# Patient Record
Sex: Male | Born: 1950
Health system: Southern US, Community
[De-identification: ages and names within clinical notes are randomized; demographics above are authoritative.]

## PROBLEM LIST (undated history)

## (undated) DIAGNOSIS — C801 Malignant (primary) neoplasm, unspecified: Secondary | ICD-10-CM

## (undated) DIAGNOSIS — IMO0001 Reserved for inherently not codable concepts without codable children: Secondary | ICD-10-CM

## (undated) DIAGNOSIS — K9041 Non-celiac gluten sensitivity: Secondary | ICD-10-CM

## (undated) DIAGNOSIS — D509 Iron deficiency anemia, unspecified: Secondary | ICD-10-CM

## (undated) DIAGNOSIS — H8110 Benign paroxysmal vertigo, unspecified ear: Secondary | ICD-10-CM

## (undated) DIAGNOSIS — I10 Essential (primary) hypertension: Secondary | ICD-10-CM

## (undated) DIAGNOSIS — G4730 Sleep apnea, unspecified: Secondary | ICD-10-CM

## (undated) DIAGNOSIS — E039 Hypothyroidism, unspecified: Secondary | ICD-10-CM

## (undated) DIAGNOSIS — K2970 Gastritis, unspecified, without bleeding: Secondary | ICD-10-CM

## (undated) DIAGNOSIS — F3289 Other specified depressive episodes: Secondary | ICD-10-CM

## (undated) DIAGNOSIS — R002 Palpitations: Secondary | ICD-10-CM

## (undated) DIAGNOSIS — J45909 Unspecified asthma, uncomplicated: Secondary | ICD-10-CM

## (undated) DIAGNOSIS — R0602 Shortness of breath: Secondary | ICD-10-CM

## (undated) DIAGNOSIS — F329 Major depressive disorder, single episode, unspecified: Secondary | ICD-10-CM

## (undated) DIAGNOSIS — I2510 Atherosclerotic heart disease of native coronary artery without angina pectoris: Secondary | ICD-10-CM

## (undated) DIAGNOSIS — E7800 Pure hypercholesterolemia, unspecified: Secondary | ICD-10-CM

## (undated) DIAGNOSIS — Z86010 Personal history of colonic polyps: Secondary | ICD-10-CM

## (undated) HISTORY — DX: Other specified depressive episodes: F32.89

## (undated) HISTORY — DX: Pure hypercholesterolemia, unspecified: E78.00

## (undated) HISTORY — DX: Hypothyroidism, unspecified: E03.9

## (undated) HISTORY — DX: Non-celiac gluten sensitivity: K90.41

## (undated) HISTORY — DX: Essential (primary) hypertension: I10

## (undated) HISTORY — DX: Atherosclerotic heart disease of native coronary artery without angina pectoris: I25.10

## (undated) HISTORY — DX: Personal history of colonic polyps: Z86.010

## (undated) HISTORY — PX: MENISCUS REPAIR: SHX5179

## (undated) HISTORY — DX: Malignant (primary) neoplasm, unspecified: C80.1

## (undated) HISTORY — DX: Benign paroxysmal vertigo, unspecified ear: H81.10

## (undated) HISTORY — PX: SHOULDER ARTHROSCOPY W/ ROTATOR CUFF REPAIR: SHX2400

## (undated) HISTORY — PX: POLYPECTOMY: SHX149

## (undated) HISTORY — PX: ROTATOR CUFF REPAIR: SHX139

## (undated) HISTORY — PX: OSTEOTOMY: SHX137

## (undated) HISTORY — DX: Iron deficiency anemia, unspecified: D50.9

## (undated) HISTORY — DX: Shortness of breath: R06.02

## (undated) HISTORY — DX: Unspecified asthma, uncomplicated: J45.909

## (undated) HISTORY — DX: Sleep apnea, unspecified: G47.30

## (undated) HISTORY — DX: Reserved for inherently not codable concepts without codable children: IMO0001

## (undated) HISTORY — DX: Major depressive disorder, single episode, unspecified: F32.9

## (undated) HISTORY — PX: COLONOSCOPY: SHX174

## (undated) HISTORY — DX: Gastritis, unspecified, without bleeding: K29.70

## (undated) HISTORY — DX: Palpitations: R00.2

---

## 2002-09-22 DIAGNOSIS — I2510 Atherosclerotic heart disease of native coronary artery without angina pectoris: Secondary | ICD-10-CM

## 2002-09-22 HISTORY — DX: Atherosclerotic heart disease of native coronary artery without angina pectoris: I25.10

## 2003-08-21 ENCOUNTER — Ambulatory Visit (HOSPITAL_COMMUNITY): Admission: RE | Admit: 2003-08-21 | Discharge: 2003-08-21 | Payer: Self-pay | Admitting: Internal Medicine

## 2003-08-25 ENCOUNTER — Ambulatory Visit (HOSPITAL_COMMUNITY): Admission: RE | Admit: 2003-08-25 | Discharge: 2003-08-26 | Payer: Self-pay | Admitting: Cardiology

## 2003-08-25 HISTORY — PX: CARDIAC CATHETERIZATION: SHX172

## 2006-04-27 ENCOUNTER — Emergency Department (HOSPITAL_COMMUNITY): Admission: EM | Admit: 2006-04-27 | Discharge: 2006-04-27 | Payer: Self-pay | Admitting: Emergency Medicine

## 2006-04-28 ENCOUNTER — Ambulatory Visit (HOSPITAL_COMMUNITY): Admission: RE | Admit: 2006-04-28 | Discharge: 2006-04-28 | Payer: Self-pay | Admitting: Internal Medicine

## 2006-10-23 DIAGNOSIS — Z860100 Personal history of colon polyps, unspecified: Secondary | ICD-10-CM

## 2006-10-23 HISTORY — DX: Personal history of colon polyps, unspecified: Z86.0100

## 2006-10-23 HISTORY — DX: Personal history of colonic polyps: Z86.010

## 2006-11-09 ENCOUNTER — Ambulatory Visit: Payer: Self-pay | Admitting: Gastroenterology

## 2006-11-10 ENCOUNTER — Ambulatory Visit: Payer: Self-pay | Admitting: Gastroenterology

## 2006-11-20 ENCOUNTER — Ambulatory Visit: Payer: Self-pay | Admitting: Gastroenterology

## 2006-11-20 ENCOUNTER — Encounter (INDEPENDENT_AMBULATORY_CARE_PROVIDER_SITE_OTHER): Payer: Self-pay | Admitting: *Deleted

## 2009-11-20 DIAGNOSIS — H8110 Benign paroxysmal vertigo, unspecified ear: Secondary | ICD-10-CM | POA: Insufficient documentation

## 2010-07-01 ENCOUNTER — Telehealth (INDEPENDENT_AMBULATORY_CARE_PROVIDER_SITE_OTHER): Payer: Self-pay | Admitting: Radiology

## 2010-07-02 ENCOUNTER — Ambulatory Visit: Payer: Self-pay

## 2010-07-02 ENCOUNTER — Encounter: Payer: Self-pay | Admitting: Internal Medicine

## 2010-07-02 ENCOUNTER — Encounter (HOSPITAL_COMMUNITY)
Admission: RE | Admit: 2010-07-02 | Discharge: 2010-07-19 | Payer: Self-pay | Source: Home / Self Care | Admitting: Cardiology

## 2010-07-02 ENCOUNTER — Ambulatory Visit: Payer: Self-pay | Admitting: Internal Medicine

## 2010-07-12 ENCOUNTER — Encounter: Admission: RE | Admit: 2010-07-12 | Discharge: 2010-07-12 | Payer: Self-pay | Admitting: Cardiology

## 2010-07-12 ENCOUNTER — Ambulatory Visit: Payer: Self-pay | Admitting: Cardiology

## 2010-07-16 ENCOUNTER — Ambulatory Visit (HOSPITAL_COMMUNITY)
Admission: RE | Admit: 2010-07-16 | Discharge: 2010-07-16 | Payer: Self-pay | Source: Home / Self Care | Admitting: Cardiology

## 2010-07-16 ENCOUNTER — Ambulatory Visit: Payer: Self-pay | Admitting: Cardiology

## 2010-07-17 HISTORY — PX: CARDIAC CATHETERIZATION: SHX172

## 2010-08-26 ENCOUNTER — Ambulatory Visit: Payer: Self-pay | Admitting: Cardiology

## 2010-10-22 NOTE — Progress Notes (Signed)
Summary: NUC PRE-PROCEDURE  Phone Note Outgoing Call   Call placed by: Domenic Polite, CNMT,  July 01, 2010 3:15 PM Call placed to: Patient Reason for Call: Confirm/change Appt Summary of Call: Reviewed information on [employer] Information Sheet (see scanned document for further details).  Spoke with patient.  Initial call taken by: Domenic Polite, CNMT,  July 01, 2010 3:16 PM     Nuclear Med Background Indications for Stress Test: Evaluation for Ischemia, Stent Patency   History: Heart Catheterization  History Comments: '04 CATH/STENT LAD,RCA  Symptoms: Chest Pain, DOE    Nuclear Pre-Procedure Cardiac Risk Factors: Family History - CAD, History of Smoking, Hypertension, Lipids

## 2010-10-22 NOTE — Assessment & Plan Note (Signed)
Summary: Cardiology Nuclear Testing  Nuclear Med Background Indications for Stress Test: Evaluation for Ischemia, Stent Patency   History: Heart Catheterization, Myocardial Perfusion Study, Stents  History Comments: '04 CATH/STENT LAD,RCA. '07 MPS: NL, EF=64%.  Symptoms: Chest Pain, Diaphoresis, DOE, Fatigue, Fatigue with Exertion, Rapid HR, SOB  Symptoms Comments: Last CP 1 mo. ago.   Nuclear Pre-Procedure Cardiac Risk Factors: Family History - CAD, History of Smoking, Hypertension, Lipids Caffeine/Decaff Intake: none NPO After: 8:00 PM Lungs: Clear IV 0.9% NS with Angio Cath: 22g     IV Site: R Hand IV Started by: Doyne Keel, CNMT Chest Size (in) 46     Height (in): 70 Weight (lb): 230 BMI: 33.12 Tech Comments: no meds this am  Nuclear Med Study 1 or 2 day study:  1 day     Stress Test Type:  Stress Reading MD:  Arvilla Meres, MD     Referring MD:  Peter Swaziland Resting Radionuclide:  Technetium 63m Tetrofosmin     Resting Radionuclide Dose:  11 mCi  Stress Radionuclide:  Technetium 39m Tetrofosmin     Stress Radionuclide Dose:  33 mCi   Stress Protocol Exercise Time (min):  8:00 min     Max HR:  148 bpm     Predicted Max HR:  163 bpm  Max Systolic BP: 208 mm Hg     Percent Max HR:  90.80 %     METS: 10.1 Rate Pressure Product:  16109    Stress Test Technologist:  Irean Hong,  RN     Nuclear Technologist:  Doyne Keel, CNMT  Rest Procedure  Myocardial perfusion imaging was performed at rest 45 minutes following the intravenous administration of Technetium 82m Tetrofosmin.  Stress Procedure  The patient exercised for eight minutes, RPE=15.   The patient stopped due to DOE   and complained of chest tightness 7/10 at peak exercise which subsided at in recovery.  There were marked significant ST-T wave changes, rare PVC. The patient had a hypertensive response to exercise. Technetium 17m Tetrofosmin was injected at peak exercise and myocardial perfusion  imaging was performed after a brief delay.  QPS Raw Data Images:  Normal; no motion artifact; normal heart/lung ratio. Stress Images:  Normal homogeneous uptake in all areas of the myocardium. Rest Images:  Normal homogeneous uptake in all areas of the myocardium. Subtraction (SDS):  Normal Transient Ischemic Dilatation:  1.12  (Normal <1.22)  Lung/Heart Ratio:  .31  (Normal <0.45)  Quantitative Gated Spect Images QGS EDV:  104 ml QGS ESV:  39 ml QGS EF:  63 % QGS cine images:  Normal  Findings Normal nuclear study      Overall Impression  Exercise Capacity: Fair exercise capacity. Clinical Symptoms: Chest pain; dyspnea ECG Impression: Positive. 1-2 mm ST depression inferior and lateral leads  Overall Impression: Equivocal stress nuclear study. Overall Impression Comments: Pateint with CP and significantly positive ECG during exercise. Perfusion images show normal perfusion with no significant transient ichemic dilation of LV. Cannot fully exclude balanced ischemia. Correlate clinically.

## 2011-01-27 ENCOUNTER — Encounter: Payer: Self-pay | Admitting: Internal Medicine

## 2011-01-27 DIAGNOSIS — F329 Major depressive disorder, single episode, unspecified: Secondary | ICD-10-CM | POA: Insufficient documentation

## 2011-01-27 DIAGNOSIS — M791 Myalgia, unspecified site: Secondary | ICD-10-CM | POA: Insufficient documentation

## 2011-01-28 ENCOUNTER — Encounter: Payer: Self-pay | Admitting: *Deleted

## 2011-01-28 DIAGNOSIS — E039 Hypothyroidism, unspecified: Secondary | ICD-10-CM | POA: Insufficient documentation

## 2011-01-28 DIAGNOSIS — E7800 Pure hypercholesterolemia, unspecified: Secondary | ICD-10-CM

## 2011-01-28 DIAGNOSIS — I2510 Atherosclerotic heart disease of native coronary artery without angina pectoris: Secondary | ICD-10-CM

## 2011-01-28 DIAGNOSIS — I10 Essential (primary) hypertension: Secondary | ICD-10-CM | POA: Insufficient documentation

## 2011-01-29 ENCOUNTER — Encounter: Payer: Self-pay | Admitting: Cardiology

## 2011-01-29 ENCOUNTER — Ambulatory Visit (INDEPENDENT_AMBULATORY_CARE_PROVIDER_SITE_OTHER): Payer: BC Managed Care – PPO | Admitting: Cardiology

## 2011-01-29 VITALS — BP 122/88 | HR 53 | Ht 69.0 in | Wt 239.8 lb

## 2011-01-29 DIAGNOSIS — E7800 Pure hypercholesterolemia, unspecified: Secondary | ICD-10-CM

## 2011-01-29 DIAGNOSIS — I2510 Atherosclerotic heart disease of native coronary artery without angina pectoris: Secondary | ICD-10-CM

## 2011-01-29 DIAGNOSIS — I10 Essential (primary) hypertension: Secondary | ICD-10-CM

## 2011-01-29 NOTE — Progress Notes (Signed)
[name] Date of Birth: 1951/06/28   History of Present Illness: [name] is seen for followup of his coronary disease. He has been doing well from a cardiac standpoint and has had no significant chest pain or shortness of breath. He did undergo cardiac catheterization in October 2001 which showed mild nonobstructive disease and continued excellent patency of the stents in the mid LAD and mid right coronary. Recently he has been experiencing increasing pain in his ankles and feet. He was seen by Dr. [name] and blood work indicated elevation of the CPK. His fenofibrate and Lipitor therapy are currently on hold.  Current Outpatient Prescriptions on File Prior to Visit  Medication Sig Dispense Refill  . Cholecalciferol (VITAMIN D PO) Take 1 tablet by mouth daily.        [name] ezetimibe (ZETIA) 10 MG tablet Take 10 mg by mouth daily.        . fenofibrate (TRICOR) 145 MG tablet Take 145 mg by mouth daily.        . Omega-3 Fatty Acids (FISH OIL PO) Take 1 capsule by mouth 2 (two) times a week.       . ramipril (ALTACE) 5 MG capsule Take 5 mg by mouth daily.        . sertraline (ZOLOFT) 50 MG tablet Take 100 mg by mouth daily.       [name] DISCONTD: atorvastatin (LIPITOR) 80 MG tablet Take 80 mg by mouth daily.        [name] DISCONTD: levothyroxine (SYNTHROID, LEVOTHROID) 200 MCG tablet Take 200 mcg by mouth daily.        [name] DISCONTD: aspirin 81 MG tablet Take 81 mg by mouth daily.        [name] DISCONTD: multivitamin (THERAGRAN) per tablet Take 1 tablet by mouth daily.        [name] DISCONTD: niacin (NIASPAN) 1000 MG CR tablet Take 1,000 mg by mouth at bedtime.          No Known Allergies  Past Medical History  Diagnosis Date  . Hypertension   . Coronary artery disease 2004    with prior stenting of the mid LAD and mid right coronary  / cardiac cath with satifsfactory finding following abnormal stress test  . Hypercholesteremia   . Hypothyroidism   . SOB (shortness of breath)   . Heart palpitations    went to ER on 04/27/2006 for palpitations    Past Surgical History  Procedure Date  . Osteotomy     removal of bone spur on (L) arm  . Cardiac catheterization 08/25/2003    EF estimated 65% / stenting of the mid left  anterior descending and mid right coronary artery in 2004 with drug-eluting stent/Two vessel obstructive atherosclerotic coronary artery disease. / Normal left ventricular function. / Successful stenting of the mid right coronary artery and the mid left anterior descending  . Cardiac catheterization 07/17/2010    EF estimated at 65% / Left ventricular angiography was performed in the RAO view  This demonstrates normal left ventricular size and contractility with normal  systolic function / Nonobstructive atherosclerotic coronary disease with continued  excellent patency of the stents in the mid LAD & mid right coronary artery / Normal left ventricular function    History  Smoking status  . Former Smoker  . Types: Cigarettes  . Quit date: 09/22/2000  Smokeless tobacco  . Not on file    History  Alcohol Use No    Family History  Problem Relation  Age of Onset  . Heart attack Father     x2  . Stroke Brother   . Coronary artery disease Brother   . Coronary artery disease Brother     Review of Systems: The review of systems is positive for pain in his ankles bilaterally and along the dorsum of his feet.this has significantly limited his activity level.  All other systems were reviewed and are negative.  Physical Exam: BP 122/88  Pulse 53  Ht 5\' 9"  (1.753 m)  Wt 239 lb 12.8 oz (108.773 kg)  BMI 35.41 kg/m2 He is an overweight white male in no acute distress. His HEENT exam is unremarkable. He has no JVD or bruits. Lungs are clear. Cardiac exam reveals a regular rate and rhythm without gallop or murmur. Abdomen is obese, soft, nontender. He has good pedal pulses. There is no edema. LABORATORY DATA: ECG today demonstrates sinus bradycardia with first degree AV block.  It is otherwise normal.  Assessment / Plan:

## 2011-01-29 NOTE — Assessment & Plan Note (Signed)
No significant coronary symptoms at this time. Will continue risk factor modification.

## 2011-01-29 NOTE — Assessment & Plan Note (Signed)
Lipitor and fenofibrate are currently on hold for his complaints of arthralgias in his ankles and feet and elevated CPK. This makes it even more important that he lose a significant amount of weight.

## 2011-01-29 NOTE — Assessment & Plan Note (Signed)
Well controlled on current therapy 

## 2011-01-29 NOTE — Patient Instructions (Signed)
Continue to hold Lipitor and fenofibrate until you follow up with Rodrigo Ran.  I will see you back in 6 months.

## 2011-02-07 NOTE — H&P (Signed)
NAME:  [name], [name]                          ACCOUNT NO.:  [id number]   MEDICAL RECORD NO.:  [id number]                   PATIENT TYPE:  OIB   LOCATION:                                       FACILITY:  [facility]   PHYSICIAN:  Peter M. Swaziland, M.D.               DATE OF BIRTH:  1950/12/08   DATE OF ADMISSION:  08/25/2003  DATE OF DISCHARGE:                                HISTORY & PHYSICAL   HISTORY OF PRESENT ILLNESS:  [name] is a 60 year old white male who  presents with symptoms of dyspnea.  He states that he has been having these  symptoms over the past 1-2 years.  He has had no chest pain.  He does have a  history of hypercholesterolemia and some family history of coronary disease.  He is also a prior smoker.  To further evaluation his symptoms, he underwent  a stress test by Dr. [name] on August 21, 2003.  He was able to exercise  for 7 minutes, 30 seconds.  He denied any chest pain at this level, but ECG  showed greater than 2 mm flat to downsloping ST depression inferolaterally,  and his treadmill was stopped due to his ECG changes.  Because of his  abnormal stress test, he is now referred for cardiac catheterization.   PAST MEDICAL HISTORY:  1. Hypercholesterolemia.  2. Hypothyroidism.   He has had previous surgery for a bone spur on his left arm.   ALLERGIES:  No known allergies.   CURRENT MEDICATIONS:  1. Levoxyl daily.  2. Lipitor 40 mg per day.  3. Tricor 160 mg per day.   SOCIAL HISTORY:  Patient works in Associate Professor at [employer].  He is  married and has two children.  He previously smoked one pack per day but  quit a year ago.  He drinks alcohol socially.  He drinks 6-8 cups of coffee  per day.   FAMILY HISTORY:  Father is age 89 and has had two prior myocardial  infarctions.  Mother is age 16 and in good health.  One brother died with a  CVA.  Three siblings are alive and well.   REVIEW OF SYSTEMS:  He has no claudication.  No TIA or CVA symptoms.   No  recent bowel or bladder complaints.  Other review of systems are negative.   PHYSICAL EXAMINATION:  GENERAL:  Patient is an obese white male in no  apparent distress.  VITAL SIGNS:  Weight is 229.  Blood pressure 132/80, pulse 60 and regular,  respirations are normal.  HEENT:  Pupils are equal, round and reactive to light and accommodation.  Extraocular movements are full.  Oropharynx is clear.  NECK:  Supple without JVD, adenopathy, thyromegaly, or bruits.  LUNGS:  Clear to auscultation and percussion.  CARDIAC:  Regular rate and rhythm.  Normal S1 and S2 without gallops,  murmurs, rubs, or clicks.  ABDOMEN:  Soft and nontender.  He has no hepatosplenomegaly, masses, or  bruits.  EXTREMITIES:  Without edema.  Pulses are 2+ and symmetric.  NEUROLOGIC:  Nonfocal.   LABORATORY DATA:  Chest x-ray shows no active disease.   ECG shows normal sinus rhythm with normal ECG at rest.   Coags are normal.  CBC is normal.  Chemistries are pending.   IMPRESSION:  1. Dyspnea on exertion.  2. Abnormal stress test, consistent with inferolateral ischemia.  3. Family history of vascular disease.  4. Hypothyroidism.  5. Hypercholesterolemia.   PLAN:  Patient will be admitted for cardiac catheterization with further  therapy pending these results.                                                Peter M. Swaziland, M.D.    PMJ/MEDQ  D:  08/24/2003  T:  08/24/2003  Job:  [id number]

## 2011-02-07 NOTE — Cardiovascular Report (Signed)
NAME:  [name], [name]                          ACCOUNT NO.:  [id number]   MEDICAL RECORD NO.:  [id number]                   PATIENT TYPE:  OIB   LOCATION:  6524                                 FACILITY:  [facility]   PHYSICIAN:  Peter M. Swaziland, M.D.               DATE OF BIRTH:  13-Sep-1951   DATE OF PROCEDURE:  08/25/2003  DATE OF DISCHARGE:  08/26/2003                              CARDIAC CATHETERIZATION   PROCEDURE:  1. Left heart catheterization.  2. Coronary angiography.  3. Stenting of the mid left anterior descending and the mid right coronary     artery.   ACCESS:  Via the right femoral artery using standard Seldinger technique.   EQUIPMENT:  6-French 4 cm right and left Judkins catheter, 6-French pigtail  catheter, 6-French arterial sheath, 6-French JR4 guide, 6-French JL4 guide,  0.014 IQ wire, 2.75 x 20 mm Quantum Maverick balloon, 3.0 x 28 mm Cypher  stent, 2.5 x 13 mm Cypher stent.   MEDICATIONS:  Nitroglycerin 200 mcg intracoronary x3, Integrilin double  bolus 1.8 mg/kg followed by continuous infusion 2 mcg/kg/minute, Versed 1 mg  IV, fentanyl 25 mcg IV, heparin 5000 units IV.   CONTRAST:  305 mL of Omnipaque.   HEMODYNAMIC DATA:  1. Aortic pressure was 138/86 with a mean of 109.  2. Left ventricular pressure was 135 with EDP of 21 mmHg.   ANGIOGRAPHIC DATA:  1. The left coronary artery arises and distributes normally.  The left main     coronary artery is normal.  2. Left anterior descending artery demonstrates an eccentric 70% stenosis in     the mid LAD.  3. The left circumflex coronary artery has minor wall irregularities less     than 10%.  4. The right coronary artery had moderately severe stenosis in the mid     vessel with sequential 80% lesions.   LEFT VENTRICULOGRAPHY:  Left ventricular angiography performed in the RAO  view demonstrates normal left ventricular size and contractility with normal  systolic function.  Ejection fraction is estimated 65%.   Wall motion  analysis demonstrates normal thickening and contractility of all segments.   DESCRIPTION OF PROCEDURE:  We proceeded with intervention of both the mid  LAD and mid right coronary artery.  We addressed the mid right coronary  artery lesion first.  This is crossed and pre dilated with a 2.75 mm Quantum  Maverick balloon up to a maximum of 12 atmospheres.  We then placed the 3.0  x 28 mm Cypher drug-eluting stent across the lesion and deployed this stent  at 11 atmospheres.  We post dilated to 14 atmospheres with an excellent  angiographic result and 0% residual stenosis.  There was TIMI grade 3 flow.   We then addressed the LAD lesion.  Using the same wire we crossed the lesion  without difficulty.  This lesion was primarily stented using a 2.5 x 13  mm  Cypher drug-eluting stent deploying at 11 atmospheres and post dilating to  14 atmospheres.  This yielded an excellent angiographic result with 0%  residual stenosis.   FINAL INTERPRETATION:  1. Two vessel obstructive atherosclerotic coronary artery disease.  2. Normal left ventricular function.  3. Successful stenting of the mid right coronary artery and the mid left     anterior descending.                                               Peter M. Swaziland, M.D.    PMJ/MEDQ  D:  08/25/2003  T:  08/27/2003  Job:  [id number]

## 2011-02-07 NOTE — Assessment & Plan Note (Signed)
[facility]                         GASTROENTEROLOGY OFFICE NOTE   NAME:[name]                       MRN:          [id number]  DATE:11/09/2006                            DOB:          Dec 27, 1950    Mr. 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 [name] for screening colonoscopy.   [name] has been in excellent health all of his life except for  hyperlipidemia requiring angiography and a drug-eluting stent placements  by Dr. Peter M. Swaziland in December of 2004.  He also has chronic  hypothyroidism and is on thyroid replacement therapy.  He denies any  current cardiovascular symptoms, referred for screening colonoscopy.  He  denies any gastrointestinal complaints, except for some previous  problems with hemorrhoids requiring lancing some 10 years ago.  He has  regular bowel movements without melena or hematochezia and denies  abdominal pain, or any upper gastrointestinal or hepatobiliary  complaints.  He has never had barium studies or any scopic exams of his  intestines.  His appetite is good and his weight is stable and he denies  any specific food intolerances.   PAST MEDICAL HISTORY:  Except for hypercholesterolemia, hypothyroidism,  and coronary artery disease this is unremarkable.   FAMILY HISTORY:  Remarkable for prostate cancer in his father and uncle,  brother with diabetes, and multiple family members with atherosclerotic  cardiovascular disease.   SOCIAL HISTORY:  He is married and lives with wife and daughter.  He has  a Event organiser in Education officer, environmental.  He does not smoke and uses alcohol  socially.   MEDICATIONS:  1. Levoxyl 175 mcg a day.  2. Lipitor 40 mg a day.  3. Tricor 145 mg a day.  4. Zetia 10 mg a day.  5. Aspirin 81 mg a day.  6. Ramiprah 5 mg a day.   He denies drug allergies.   REVIEW OF SYSTEMS:  Non contributory.  He denies any current  cardiovascular,  pulmonary, genitourinary, neurologic, orthopedic or  psychiatric problems.   EXAMINATION:  He is a healthy appearing white male in no distress.  His  stated age.  He is 5 feet 9 inches tall and weighs 222 pounds.  Blood pressure 110/80  and pulse was 80 and regular.  I could not appreciate stigmata chronic liver disease.  There was no thyromegaly or lymphadenopathy noted.  His chest was clear and he appeared to be on a regular rhythm without  murmur, gallops or rubs.  I could not appreciate hepatosplenomegaly, abdominal masses or etc's.  Bowel sounds were normal.  Peripheral extremities were unremarkable.  Mental status was normal.  Rectal exam was deferred.   ASSESSMENT:  1. Need for screening colonoscopy because of his age without other      risk factors.  2. Hypercholesterolemia with coronary artery disease and stent      placement in December 2004.  3. History of hypothyroidism on replacement therapy.  4. Previous history of hemorrhoids requiring surgical therapy.   RECOMMENDATIONS:  1. Outpatient colonoscopy at his convenience, on aspirin therapy.  2. Continue all other medications as per Dr. [name].     Vania Rea. Jarold Motto, MD, Caleen Essex, FAGA  Electronically Signed    DRP/MedQ  DD: 11/09/2006  DT: 11/09/2006  Job #: [id number]   cc:   Loraine Leriche A. Perini, M.D.  Peter M. Swaziland, M.D.

## 2011-02-18 DIAGNOSIS — R809 Proteinuria, unspecified: Secondary | ICD-10-CM | POA: Insufficient documentation

## 2011-03-11 ENCOUNTER — Encounter: Payer: Self-pay | Admitting: Gastroenterology

## 2011-03-11 ENCOUNTER — Other Ambulatory Visit (INDEPENDENT_AMBULATORY_CARE_PROVIDER_SITE_OTHER): Payer: BC Managed Care – PPO

## 2011-03-11 ENCOUNTER — Ambulatory Visit (INDEPENDENT_AMBULATORY_CARE_PROVIDER_SITE_OTHER): Payer: BC Managed Care – PPO | Admitting: Gastroenterology

## 2011-03-11 VITALS — BP 132/72 | HR 76 | Ht 69.0 in | Wt 232.0 lb

## 2011-03-11 DIAGNOSIS — D509 Iron deficiency anemia, unspecified: Secondary | ICD-10-CM

## 2011-03-11 LAB — IBC PANEL: Saturation Ratios: 5.3 % — ABNORMAL LOW (ref 20.0–50.0)

## 2011-03-11 LAB — HIGH SENSITIVITY CRP: CRP, High Sensitivity: 10.02 mg/L — ABNORMAL HIGH (ref 0.00–5.00)

## 2011-03-11 MED ORDER — PEG-KCL-NACL-NASULF-NA ASC-C 100 G PO SOLR: 1.0000 | Freq: Once | ORAL | Status: DC

## 2011-03-11 NOTE — Patient Instructions (Signed)
Your procedure has been scheduled for 04/07/2011, please follow the seperate instructions.  Your prescription(s) have been sent to you pharmacy.  Please go to the basement today for your labs.   

## 2011-03-11 NOTE — Progress Notes (Signed)
History of Present Illness:  This is a 60 year old Caucasian male with mild iron deficiency, but negative stool guaiac cards, and negative GI symptomatology except for occasional acid reflux management and assess. He does take aspirin 325 mg a day, and recently has been on ferrous sulfate. Multiple labs were viewed and are otherwise unremarkable. Patient denies any symptoms of malabsorption or family history of celiac disease. He does have coronary artery disease with previous angioplasty and stent placement. Other problems include hyperlipidemia, hypertension, and chronic thyroid dysfunction. There is no history of hepatitis, pancreatitis, previous abdominal surgery, or known gallbladder or liver disease. Did perform colonoscopy in February of 2008 which was unremarkable except for small hyperplastic polyp. There is no family history of colon cancer.   I have reviewed this patient's present history, medical and surgical past history, allergies and medications.     ROS: The remainder of the 10 point ROS is negative   Past Medical History  Diagnosis Date  . Hypertension   . Coronary artery disease 2004    with prior stenting of the mid LAD and mid right coronary  / cardiac cath with satifsfactory finding following abnormal stress test  . Hypercholesteremia   . Hypothyroidism   . SOB (shortness of breath)   . Heart palpitations     went to ER on 04/27/2006 for palpitations  . Personal history of colonic polyps 10/2006    hyperplastic polyp  . Depressive disorder, not elsewhere classified   . Iron deficiency anemia, unspecified   . Myalgia and myositis, unspecified   . Benign paroxysmal positional vertigo    Past Surgical History  Procedure Date  . Osteotomy     removal of bone spur on (L) arm  . Cardiac catheterization 08/25/2003    EF estimated 65% / stenting of the mid left  anterior descending and mid right coronary artery in 2004 with drug-eluting stent/Two vessel obstructive  atherosclerotic coronary artery disease. / Normal left ventricular function. / Successful stenting of the mid right coronary artery and the mid left anterior descending  . Cardiac catheterization 07/17/2010    EF estimated at 65% / Left ventricular angiography was performed in the RAO view  This demonstrates normal left ventricular size and contractility with normal  systolic function / Nonobstructive atherosclerotic coronary disease with continued  excellent patency of the stents in the mid LAD & mid right coronary artery / Normal left ventricular function    reports that he quit smoking about 10 years ago. His smoking use included Cigarettes. He does not have any smokeless tobacco history on file. He reports that he drinks alcohol. He reports that he does not use illicit drugs. family history includes Coronary artery disease in his brothers; Diabetes in his brother; Heart attack in his father; Prostate cancer in his father and paternal uncle; and Stroke in his brother. No Known Allergies     Physical Exam: General well developed well nourished patient in no acute distress, appearing his stated age Eyes PERRLA, no icterus, fundoscopic exam per opthamologist Skin no lesions noted Neck supple, no adenopathy, no thyroid enlargement, no tenderness Chest clear to percussion and auscultation Heart no significant murmurs, gallops or rubs noted Abdomen no hepatosplenomegaly masses or tenderness, BS normal.  Extremities no acute joint lesions, edema, phlebitis or evidence of cellulitis. Neurologic patient oriented x 3, cranial nerves intact, no focal neurologic deficits noted. Psychological mental status normal and normal affect.  Assessment and plan: Mild anemia with hemoglobin 12.3 and microcytic indices. I have  ordered an anemia profile and will proceed with colonoscopy exam, also endoscopy and small bowel biopsy. I suspect this patient has gastroduodenal erosions associated with salicylate use. I  have asked him to continue his iron replacement and other medications per primary care.  Encounter Diagnosis  Name Primary?  . Iron deficiency anemia, unspecified Yes

## 2011-03-12 ENCOUNTER — Encounter: Payer: Self-pay | Admitting: Gastroenterology

## 2011-03-12 ENCOUNTER — Telehealth: Payer: Self-pay | Admitting: *Deleted

## 2011-03-12 NOTE — Telephone Encounter (Signed)
Pt aware he will continue his oral iron and he would like to get his b12 injections at Dr Therisa Doyne office.  I will fax Dr [name] this note and his labs. He will need B12 once a week for three weeks. Then he should get b12 a month for one year. Or he can choose to be rxed for Nascobal for one year. He will contact Dr Therisa Doyne office about their policy to set up getting his injections.

## 2011-03-12 NOTE — Telephone Encounter (Signed)
Message copied by Leonette Monarch on Wed Mar 12, 2011 10:02 AM ------      Message from: Mardella Layman      Created: Tue Mar 11, 2011  4:48 PM       Continue oral iron therapy and start B12 shots ASAP. Please send his lab results to his primary care physician.

## 2011-04-07 ENCOUNTER — Encounter: Payer: Self-pay | Admitting: Gastroenterology

## 2011-04-07 ENCOUNTER — Ambulatory Visit (AMBULATORY_SURGERY_CENTER): Payer: BC Managed Care – PPO | Admitting: Gastroenterology

## 2011-04-07 DIAGNOSIS — K2970 Gastritis, unspecified, without bleeding: Secondary | ICD-10-CM

## 2011-04-07 DIAGNOSIS — K2990 Gastroduodenitis, unspecified, without bleeding: Secondary | ICD-10-CM

## 2011-04-07 DIAGNOSIS — E538 Deficiency of other specified B group vitamins: Secondary | ICD-10-CM

## 2011-04-07 DIAGNOSIS — K2980 Duodenitis without bleeding: Secondary | ICD-10-CM

## 2011-04-07 DIAGNOSIS — D509 Iron deficiency anemia, unspecified: Secondary | ICD-10-CM

## 2011-04-07 DIAGNOSIS — K2940 Chronic atrophic gastritis without bleeding: Secondary | ICD-10-CM

## 2011-04-07 DIAGNOSIS — Z86010 Personal history of colonic polyps: Secondary | ICD-10-CM

## 2011-04-07 MED ORDER — SODIUM CHLORIDE 0.9 % IV SOLN: 500.0000 mL | INTRAVENOUS | Status: DC

## 2011-04-07 NOTE — Patient Instructions (Signed)
Please eat a diet that is high in fiber.  Dr Norval Gable Nurse will call you tomorrow.  You may resume your medications as you would normally take them.

## 2011-04-07 NOTE — Progress Notes (Signed)
After versed 5 mg and Fentanyl 50 mcg pt's sats dropped to 87%.  Pt pillow flatted and his airway adjusted.  Sats increasded to 95%.  Maw  13:58 hung second bag of 0.9% n.s. MAW  Pt tolerated the colon exam well. MAW   Pt repositioned for egd.  Pt passing a good amount of flatus.  MAW  Pt tolerated the egd well. MAW

## 2011-04-08 ENCOUNTER — Telehealth: Payer: Self-pay | Admitting: *Deleted

## 2011-04-08 DIAGNOSIS — D509 Iron deficiency anemia, unspecified: Secondary | ICD-10-CM

## 2011-04-08 DIAGNOSIS — K2970 Gastritis, unspecified, without bleeding: Secondary | ICD-10-CM

## 2011-04-08 DIAGNOSIS — K2990 Gastroduodenitis, unspecified, without bleeding: Secondary | ICD-10-CM

## 2011-04-08 LAB — HELICOBACTER PYLORI SCREEN-BIOPSY: UREASE: NEGATIVE

## 2011-04-08 NOTE — Telephone Encounter (Signed)
appt on 05/06/2011 and labs same day pt aware

## 2011-04-08 NOTE — Telephone Encounter (Signed)

## 2011-04-08 NOTE — Telephone Encounter (Signed)
Needs one month office visit and a day before that visit he needs CBC, and anemia profile. And he needs stool cards at his next office visit. I have left a messgae for pt to call back and schedule ov and labs

## 2011-04-15 ENCOUNTER — Encounter: Payer: Self-pay | Admitting: Gastroenterology

## 2011-05-05 ENCOUNTER — Telehealth: Payer: Self-pay | Admitting: *Deleted

## 2011-05-05 ENCOUNTER — Other Ambulatory Visit (INDEPENDENT_AMBULATORY_CARE_PROVIDER_SITE_OTHER): Payer: Self-pay

## 2011-05-05 ENCOUNTER — Encounter: Payer: Self-pay | Admitting: *Deleted

## 2011-05-05 DIAGNOSIS — D509 Iron deficiency anemia, unspecified: Secondary | ICD-10-CM

## 2011-05-05 LAB — CBC WITH DIFFERENTIAL/PLATELET
Basophils Relative: 1 % (ref 0.0–3.0)
Eosinophils Absolute: 0.2 10*3/uL (ref 0.0–0.7)
Eosinophils Relative: 5.3 % — ABNORMAL HIGH (ref 0.0–5.0)
Lymphocytes Relative: 38.2 % (ref 12.0–46.0)
Monocytes Relative: 12.4 % — ABNORMAL HIGH (ref 3.0–12.0)
Neutrophils Relative %: 43.1 % (ref 43.0–77.0)
RBC: 4.94 Mil/uL (ref 4.22–5.81)
WBC: 4.7 10*3/uL (ref 4.5–10.5)

## 2011-05-05 LAB — FOLATE: Folate: >24.8 ng/mL (ref >5.9)

## 2011-05-05 LAB — IBC PANEL
Iron: 183 ug/dL — ABNORMAL HIGH (ref 42–165)
Saturation Ratios: 35.4 % (ref 20.0–50.0)

## 2011-05-05 LAB — VITAMIN B12: Vitamin B-12: 267 pg/mL (ref 211–911)

## 2011-05-05 NOTE — Telephone Encounter (Signed)
Message copied by Leonette Monarch on Mon May 05, 2011  4:15 PM ------      Message from: Jarold Motto, DAVID R      Created: Mon May 05, 2011  3:09 PM       RESTART B12 WEEKLY X 3,THEN MONTHLY...

## 2011-05-05 NOTE — Telephone Encounter (Signed)
Pt states that he is already on b12 injections at Dr Therisa Doyne office

## 2011-05-06 ENCOUNTER — Other Ambulatory Visit: Payer: BC Managed Care – PPO

## 2011-05-06 ENCOUNTER — Ambulatory Visit (INDEPENDENT_AMBULATORY_CARE_PROVIDER_SITE_OTHER): Payer: Self-pay | Admitting: Gastroenterology

## 2011-05-06 ENCOUNTER — Encounter: Payer: Self-pay | Admitting: Gastroenterology

## 2011-05-06 VITALS — BP 126/74 | HR 64 | Ht 69.5 in | Wt 233.0 lb

## 2011-05-06 DIAGNOSIS — D509 Iron deficiency anemia, unspecified: Secondary | ICD-10-CM

## 2011-05-06 DIAGNOSIS — D510 Vitamin B12 deficiency anemia due to intrinsic factor deficiency: Secondary | ICD-10-CM | POA: Insufficient documentation

## 2011-05-06 NOTE — Progress Notes (Signed)
History of Present Illness: This is a 60 year old Caucasian male with a weight of iron deficiency anemia with negative endoscopy and colonoscopy including small bowel biopsy. His iron deficiency has resolved with oral iron replacement therapy. He does take aspirin 325 mg a day, but denies any GI complaints. Serum B12 level was extremely low, and he has been started on parenteral replacement therapy. He does have a family history of pernicious anemia. Continues with mild general malaise and fatigue. Again, he denies any GI issues whatsoever.    Current Medications, Allergies, Past Medical History, Past Surgical History, Family History and Social History were reviewed in [employer] record.   Assessment and plan: Probable pernicious anemia---we will check antiparietal cell antibodies and intrinsic factor anti-bodies. His serum ferritin has normalized, and I have stopped his oral iron replacement but have advised him to continue with parenteral B12 replacement. If they bodies confirm pernicious anemia, he seemed to have a familial problem here , will advise his children to be checked also. I will see him on a when necessary basis in the future as needed with medical followup with Dr. Rodrigo Ran. I spent a good deal of time with this patient today reviewing his labs, endoscopic report, medications, and clinical course.   Please copy her primary care physician, referring physician, and pertinent subspecialists. Encounter Diagnosis  Name Primary?  . Iron deficiency anemia Yes

## 2011-05-06 NOTE — Patient Instructions (Signed)
Please go to the basement today for your labs.   

## 2011-06-12 DIAGNOSIS — R7301 Impaired fasting glucose: Secondary | ICD-10-CM | POA: Insufficient documentation

## 2011-06-12 DIAGNOSIS — E539 Vitamin B deficiency, unspecified: Secondary | ICD-10-CM | POA: Insufficient documentation

## 2011-11-25 DIAGNOSIS — R5383 Other fatigue: Secondary | ICD-10-CM | POA: Insufficient documentation

## 2011-12-24 DIAGNOSIS — E291 Testicular hypofunction: Secondary | ICD-10-CM | POA: Insufficient documentation

## 2012-05-26 ENCOUNTER — Emergency Department: Payer: Self-pay | Admitting: Emergency Medicine

## 2012-06-17 ENCOUNTER — Encounter (HOSPITAL_COMMUNITY): Admission: RE | Admit: 2012-06-17 | Payer: BC Managed Care – PPO | Source: Ambulatory Visit

## 2012-06-29 ENCOUNTER — Encounter (HOSPITAL_COMMUNITY): Payer: Self-pay

## 2012-06-29 ENCOUNTER — Encounter (HOSPITAL_COMMUNITY)
Admission: RE | Admit: 2012-06-29 | Discharge: 2012-06-29 | Disposition: A | Discharge status: Home / Self Care | Payer: BC Managed Care – PPO | Source: Ambulatory Visit | Attending: Internal Medicine | Admitting: Internal Medicine

## 2012-06-29 DIAGNOSIS — D509 Iron deficiency anemia, unspecified: Secondary | ICD-10-CM | POA: Insufficient documentation

## 2012-06-29 MED ORDER — SODIUM CHLORIDE 0.9 % IV SOLN
Freq: Once | INTRAVENOUS | Status: AC
  Administered 2012-06-29: 10:00:00 via INTRAVENOUS

## 2012-06-29 MED ORDER — SODIUM CHLORIDE 0.9 % IV SOLN
Freq: Once | INTRAVENOUS | Status: AC
  Administered 2012-06-29: 10:00:00 via INTRAVENOUS
  Filled 2012-06-29: qty 100

## 2012-06-29 NOTE — Progress Notes (Signed)
Feraheme infusing over 30 minutes at 200 mls/hour when patient stated, "I feel a little flushed. No other complaints. Vital signs stable. Decreased rate to 150 mls/hr. Patient tolerated the rest of the infusion.

## 2012-12-02 DIAGNOSIS — K900 Celiac disease: Secondary | ICD-10-CM | POA: Insufficient documentation

## 2012-12-06 ENCOUNTER — Ambulatory Visit (INDEPENDENT_AMBULATORY_CARE_PROVIDER_SITE_OTHER): Payer: BC Managed Care – PPO | Admitting: Cardiology

## 2012-12-06 ENCOUNTER — Encounter: Payer: Self-pay | Admitting: Cardiology

## 2012-12-06 VITALS — BP 110/70 | HR 66 | Ht 69.0 in | Wt 229.8 lb

## 2012-12-06 DIAGNOSIS — I2510 Atherosclerotic heart disease of native coronary artery without angina pectoris: Secondary | ICD-10-CM

## 2012-12-06 DIAGNOSIS — I10 Essential (primary) hypertension: Secondary | ICD-10-CM

## 2012-12-06 DIAGNOSIS — E7800 Pure hypercholesterolemia, unspecified: Secondary | ICD-10-CM

## 2012-12-06 DIAGNOSIS — E039 Hypothyroidism, unspecified: Secondary | ICD-10-CM

## 2012-12-06 NOTE — Progress Notes (Signed)
[name] Date of Birth: May 21, 1951   History of Present Illness: [name] is seen for followup of his coronary disease. He reports that recently he said symptoms of lethargy and gets tired more easily. His thyroid dose was recently increased. He does have occasional mild fluttering sensation in his heart. He denies any significant chest pain or dyspnea. His does drink a lot of caffeine. This past year he did go on a gluten-free diet and has lost over 10 pounds. He does have a history of anemia, low B12, and testosterone levels. He has a history of stenting of the mid LAD and mid right coronary in 2004. In 2011 he had a nuclear stress test. He was able to exercise for 8 minutes with significant ST segment changes. Perfusion imaging was normal. He did undergo repeat cardiac catheterization which showed nonobstructive disease and continued excellent patency of his stents.  Current Outpatient Prescriptions on File Prior to Visit  Medication Sig Dispense Refill  . acetaminophen (TYLENOL) 500 MG tablet Take 500 mg by mouth daily.        [name] aspirin 325 MG tablet Take 81 mg by mouth daily.       . cyanocobalamin (,VITAMIN B-12,) 1000 MCG/ML injection Inject 1,000 mcg into the muscle every 30 (thirty) days.        [name] ezetimibe (ZETIA) 10 MG tablet Take 10 mg by mouth daily.        . fenofibrate 160 MG tablet Take 160 mg by mouth daily.        . ferrous sulfate 325 (65 FE) MG tablet Take 325 mg by mouth daily with breakfast.        . Flaxseed, Linseed, (FLAX SEED OIL PO) Take 2 capsules by mouth daily.        [name] levothyroxine (SYNTHROID, LEVOTHROID) 200 MCG tablet Take 200 mcg by mouth daily. 500 daily and 200 on Sunday and Wednesday      . ramipril (ALTACE) 10 MG capsule Take 10 mg by mouth daily.        . sertraline (ZOLOFT) 50 MG tablet Take 100 mg by mouth daily.       . simvastatin (ZOCOR) 20 MG tablet Take 20 mg by mouth every evening.       No current facility-administered medications on file  prior to visit.    No Known Allergies  Past Medical History  Diagnosis Date  . Hypertension   . Coronary artery disease 2004    with prior stenting of the mid LAD and mid right coronary  / cardiac cath with satifsfactory finding following abnormal stress test  . Hypercholesteremia   . Hypothyroidism   . SOB (shortness of breath)   . Heart palpitations     went to ER on 04/27/2006 for palpitations  . Personal history of colonic polyps 10/2006    hyperplastic polyp  . Depressive disorder, not elsewhere classified   . Iron deficiency anemia, unspecified   . Myalgia and myositis, unspecified   . Benign paroxysmal positional vertigo   . Gastritis     Past Surgical History  Procedure Laterality Date  . Osteotomy      removal of bone spur on (L) arm  . Cardiac catheterization  08/25/2003    EF estimated 65% / stenting of the mid left  anterior descending and mid right coronary artery in 2004 with drug-eluting stent/Two vessel obstructive atherosclerotic coronary artery disease. / Normal left ventricular function. / Successful stenting of the mid right coronary  artery and the mid left anterior descending  . Cardiac catheterization  07/17/2010    EF estimated at 65% / Left ventricular angiography was performed in the RAO view  This demonstrates normal left ventricular size and contractility with normal  systolic function / Nonobstructive atherosclerotic coronary disease with continued  excellent patency of the stents in the mid LAD & mid right coronary artery / Normal left ventricular function  . Colonoscopy    . Polypectomy      History  Smoking status  . Former Smoker -- 1.00 packs/day  . Types: Cigarettes  . Quit date: 09/22/2000  Smokeless tobacco  . Never Used    History  Alcohol Use  . 4.2 oz/week  . 7 Glasses of wine per week    Family History  Problem Relation Age of Onset  . Heart attack Father     x2  . Stroke Brother   . Coronary artery disease Brother   .  Coronary artery disease Brother   . Prostate cancer Father   . Prostate cancer Paternal Uncle   . Diabetes Brother     Review of Systems: As noted in history of present illness. All other systems were reviewed and are negative.  Physical Exam: BP 110/70  Pulse 66  Ht 5\' 9"  (1.753 m)  Wt 229 lb 12.8 oz (104.237 kg)  BMI 33.92 kg/m2  SpO2 95% He is an overweight white male in no acute distress. His HEENT exam is unremarkable. He has no JVD or bruits. No adenopathy or thyromegaly. Lungs are clear. Cardiac exam reveals a regular rate and rhythm without gallop or murmur. Abdomen is soft, nontender. He has good pedal pulses. There is no edema. Neuro he is alert oriented x3. Cranial nerves II through XII are intact. LABORATORY DATA: ECG today demonstrates sinus bradycardia with first degree AV block. It is otherwise normal. Rate is 60 beats per minute.  Assessment / Plan: 1. Coronary disease. Status post stenting of the mid LAD and RCA in 2004. Cardiac catheterization in 2011 showed patent stents. Really no significant symptoms of angina. I will forego repeat stress testing at this point given the absence of significant symptoms. Prior stress testing was equivocal.  2. Palpitations. These are fairly minor. I recommended significantly reducing his caffeine intake. If his symptoms persist we could consider having him wear a monitor.  3. Hypercholesterolemia. On Zetia and Zocor.  4. Hypothyroidism.

## 2012-12-30 ENCOUNTER — Ambulatory Visit (INDEPENDENT_AMBULATORY_CARE_PROVIDER_SITE_OTHER): Payer: BC Managed Care – PPO

## 2012-12-30 ENCOUNTER — Telehealth: Payer: Self-pay | Admitting: Cardiology

## 2012-12-30 VITALS — BP 120/78 | HR 90 | Resp 22 | Wt 230.0 lb

## 2012-12-30 DIAGNOSIS — R079 Chest pain, unspecified: Secondary | ICD-10-CM

## 2012-12-30 DIAGNOSIS — I2510 Atherosclerotic heart disease of native coronary artery without angina pectoris: Secondary | ICD-10-CM

## 2012-12-30 NOTE — Progress Notes (Signed)
Patient walked in with c/o chest that began today while working outside on a house nailing 2 x 4s to a roof for several hours prior to onset of pain.  Patient states when he got in his vehicle to leave for lunch, that he couldn't catch his breath and was hurting in the middle of his chest.  Patient points to an area of tenderness to the right of his sternum; states it feels bruised.  Patient states at this time both of his arms became heavy and achy. Patient ambulates without difficulty and is alert and oriented x 4.  12-lead EKG performed and taken to Dr. Jordan for review.  Dr. Jordan advised that patient go home and rest, take NSAIDS today and through the weekend and to call us Monday if he continues to have pain. °

## 2012-12-30 NOTE — Telephone Encounter (Signed)
Patient walked in with c/o chest that began today while working outside on a house nailing 2 x 4s to a roof for several hours prior to onset of pain.  Patient states when he got in his vehicle to leave for lunch, that he couldn't catch his breath and was hurting in the middle of his chest.  Patient points to an area of tenderness to the right of his sternum; states it feels bruised.  Patient states at this time both of his arms became heavy and achy. Patient ambulates without difficulty and is alert and oriented x 4.  12-lead EKG performed and taken to Dr. [place] for review.  Dr. [place] advised that patient go home and rest, take NSAIDS today and through the weekend and to call us Monday if he continues to have pain.

## 2013-06-08 ENCOUNTER — Encounter: Payer: Self-pay | Admitting: Cardiology

## 2014-01-04 ENCOUNTER — Encounter: Payer: Self-pay | Admitting: Cardiology

## 2014-02-01 ENCOUNTER — Encounter: Payer: Self-pay | Admitting: Cardiology

## 2014-03-15 ENCOUNTER — Encounter: Payer: Self-pay | Admitting: Internal Medicine

## 2014-04-18 ENCOUNTER — Encounter: Payer: Self-pay | Admitting: Cardiology

## 2014-04-18 ENCOUNTER — Ambulatory Visit (INDEPENDENT_AMBULATORY_CARE_PROVIDER_SITE_OTHER): Payer: BC Managed Care – PPO | Admitting: Cardiology

## 2014-04-18 VITALS — BP 132/84 | HR 61 | Ht 69.0 in | Wt 223.7 lb

## 2014-04-18 DIAGNOSIS — I10 Essential (primary) hypertension: Secondary | ICD-10-CM

## 2014-04-18 DIAGNOSIS — E7800 Pure hypercholesterolemia, unspecified: Secondary | ICD-10-CM

## 2014-04-18 DIAGNOSIS — I2510 Atherosclerotic heart disease of native coronary artery without angina pectoris: Secondary | ICD-10-CM

## 2014-04-18 NOTE — Progress Notes (Signed)
[name] Date of Birth: 1951-06-28   History of Present Illness: [name] is seen for followup of his coronary disease.  He does have a history of anemia, low B12, and testosterone levels. He has a history of stenting of the mid LAD and mid right coronary in 2004. In 2011 he had a nuclear stress test. He was able to exercise for 8 minutes with significant ST segment changes. Perfusion imaging was normal. He did undergo repeat cardiac catheterization which showed nonobstructive disease and continued excellent patency of his stents. He states he is doing well from a cardiac standpoint. No chest pain, SOB, or palpitations. Exercise is limited by pain in legs and buttocks due to a lumbar problem. He is seeing ortho and getting PT. He has lost 6 lbs. He is currently in a cholesterol study for one of the PCSK-9 inhibitors. He is working full time for [employer] for [employer].  Current Outpatient Prescriptions on File Prior to Visit  Medication Sig Dispense Refill  . acetaminophen (TYLENOL) 500 MG tablet Take 500 mg by mouth as needed.       [name] buPROPion (WELLBUTRIN XL) 150 MG 24 hr tablet Take 150 mg by mouth daily.       . cyanocobalamin (,VITAMIN B-12,) 1000 MCG/ML injection Inject 1,000 mcg into the muscle every 30 (thirty) days.        [name] ezetimibe (ZETIA) 10 MG tablet Take 10 mg by mouth daily.        . fenofibrate 160 MG tablet Take 160 mg by mouth daily.        . Flaxseed, Linseed, (FLAX SEED OIL PO) Take 2 capsules by mouth as needed.       [name] levothyroxine (SYNTHROID, LEVOTHROID) 200 MCG tablet Take 200-300 mcg by mouth daily.       . ramipril (ALTACE) 10 MG capsule Take 10 mg by mouth daily.        . simvastatin (ZOCOR) 20 MG tablet Take 20 mg by mouth every evening.      . TESTOSTERONE TD Inject as directed as directed.        No current facility-administered medications on file prior to visit.    No Known Allergies  Past Medical History  Diagnosis Date  . Hypertension   . Coronary  artery disease 2004    with prior stenting of the mid LAD and mid right coronary  / cardiac cath with satifsfactory finding following abnormal stress test  . Hypercholesteremia   . Hypothyroidism   . SOB (shortness of breath)   . Heart palpitations     went to ER on 04/27/2006 for palpitations  . Personal history of colonic polyps 10/2006    hyperplastic polyp  . Depressive disorder, not elsewhere classified   . Iron deficiency anemia, unspecified   . Myalgia and myositis, unspecified   . Benign paroxysmal positional vertigo   . Gastritis     Past Surgical History  Procedure Laterality Date  . Osteotomy      removal of bone spur on (L) arm  . Cardiac catheterization  08/25/2003    EF estimated 65% / stenting of the mid left  anterior descending and mid right coronary artery in 2004 with drug-eluting stent/Two vessel obstructive atherosclerotic coronary artery disease. / Normal left ventricular function. / Successful stenting of the mid right coronary artery and the mid left anterior descending  . Cardiac catheterization  07/17/2010    EF estimated at 65% / Left ventricular angiography was performed  in the RAO view  This demonstrates normal left ventricular size and contractility with normal  systolic function / Nonobstructive atherosclerotic coronary disease with continued  excellent patency of the stents in the mid LAD & mid right coronary artery / Normal left ventricular function  . Colonoscopy    . Polypectomy      History  Smoking status  . Former Smoker -- 1.00 packs/day  . Types: Cigarettes  . Quit date: 09/22/2000  Smokeless tobacco  . Never Used    History  Alcohol Use  . 4.2 oz/week  . 7 Glasses of wine per week    Family History  Problem Relation Age of Onset  . Heart attack Father     x2  . Stroke Brother   . Coronary artery disease Brother   . Coronary artery disease Brother   . Prostate cancer Father   . Prostate cancer Paternal Uncle   . Diabetes Brother      Review of Systems: As noted in history of present illness. All other systems were reviewed and are negative.  Physical Exam: BP 132/84  Pulse 61  Ht 5\' 9"  (1.753 m)  Wt 223 lb 11.2 oz (101.47 kg)  BMI 33.02 kg/m2 He is an overweight white male in no acute distress. His HEENT exam is unremarkable. He has no JVD or bruits. No adenopathy or thyromegaly. Lungs are clear. Cardiac exam reveals a regular rate and rhythm without gallop or murmur. Abdomen is soft, nontender. He has good pedal pulses. There is no edema. Neuro he is alert oriented x3. Cranial nerves II through XII are intact.  LABORATORY DATA: ECG today demonstrates NSR, rate 61. Normal.  Assessment / Plan: 1. Coronary disease. Status post stenting of the mid LAD and RCA in 2004. Cardiac catheterization in 2011 showed patent stents. Really no significant symptoms of angina. I will forego repeat stress testing at this point given the absence of significant symptoms. Prior stress testing was equivocal.  2. HTN- well controlled.  3. Hypercholesterolemia. On Zetia, Zocor, fenofibrate and study drug. Request most recent labs.  4. Hypothyroidism.

## 2014-04-18 NOTE — Patient Instructions (Signed)
Continue your current therapy  I will see you in one year   

## 2014-06-17 ENCOUNTER — Encounter: Payer: Self-pay | Admitting: Gastroenterology

## 2015-04-27 ENCOUNTER — Ambulatory Visit (INDEPENDENT_AMBULATORY_CARE_PROVIDER_SITE_OTHER): Payer: BLUE CROSS/BLUE SHIELD | Admitting: Cardiology

## 2015-04-27 ENCOUNTER — Encounter: Payer: Self-pay | Admitting: Cardiology

## 2015-04-27 VITALS — BP 126/74 | HR 59 | Ht 69.0 in | Wt 236.2 lb

## 2015-04-27 DIAGNOSIS — I2510 Atherosclerotic heart disease of native coronary artery without angina pectoris: Secondary | ICD-10-CM

## 2015-04-27 DIAGNOSIS — I10 Essential (primary) hypertension: Secondary | ICD-10-CM

## 2015-04-27 DIAGNOSIS — E7800 Pure hypercholesterolemia, unspecified: Secondary | ICD-10-CM

## 2015-04-27 NOTE — Patient Instructions (Addendum)
Continue your current therapy.  We will schedule you for a nuclear stress test   I will see you in one year  

## 2015-04-27 NOTE — Progress Notes (Signed)
[name] Date of Birth: 1951-02-02   History of Present Illness: [name] is seen for followup of his coronary disease.  He does have a history of anemia, low B12, and testosterone levels. He has a history of stenting of the mid LAD and mid right coronary in 2004 with DES. In 2011 he had a nuclear stress test. He was able to exercise for 8 minutes with significant ST segment changes. Perfusion imaging was normal. He did undergo repeat cardiac catheterization which showed nonobstructive disease and continued excellent patency of his stents.  He states he is doing well from a cardiac standpoint. No chest pain, SOB, or palpitations. Back problems have improved. Enjoys hiking but doesn't exercise as much as he should.  He completed a cholesterol study for one of the PCSK-9 inhibitors.  He is working full time for [employer] for [employer].  Current Outpatient Prescriptions on File Prior to Visit  Medication Sig Dispense Refill  . amLODipine (NORVASC) 5 MG tablet Take 1 tablet by mouth daily.    [name] aspirin 81 MG tablet Take 81 mg by mouth daily.    [name] buPROPion (WELLBUTRIN XL) 150 MG 24 hr tablet Take 150 mg by mouth daily.     . cyanocobalamin (,VITAMIN B-12,) 1000 MCG/ML injection Inject 1,000 mcg into the muscle every 30 (thirty) days.      [name] ezetimibe (ZETIA) 10 MG tablet Take 10 mg by mouth daily.      . fenofibrate 160 MG tablet Take 160 mg by mouth daily.      [name] ketoconazole (NIZORAL) 2 % shampoo as directed.    [name] levothyroxine (SYNTHROID, LEVOTHROID) 200 MCG tablet Take 200-300 mcg by mouth daily.     . simvastatin (ZOCOR) 20 MG tablet Take 20 mg by mouth every evening.    . TESTOSTERONE TD Inject as directed as directed.      No current facility-administered medications on file prior to visit.    No Known Allergies  Past Medical History  Diagnosis Date  . Hypertension   . Coronary artery disease 2004    with prior stenting of the mid LAD and mid right coronary  / cardiac cath with  satifsfactory finding following abnormal stress test  . Hypercholesteremia   . Hypothyroidism   . SOB (shortness of breath)   . Heart palpitations     went to ER on 04/27/2006 for palpitations  . Personal history of colonic polyps 10/2006    hyperplastic polyp  . Depressive disorder, not elsewhere classified   . Iron deficiency anemia, unspecified   . Myalgia and myositis, unspecified   . Benign paroxysmal positional vertigo   . Gastritis     Past Surgical History  Procedure Laterality Date  . Osteotomy      removal of bone spur on (L) arm  . Cardiac catheterization  08/25/2003    EF estimated 65% / stenting of the mid left  anterior descending and mid right coronary artery in 2004 with drug-eluting stent/Two vessel obstructive atherosclerotic coronary artery disease. / Normal left ventricular function. / Successful stenting of the mid right coronary artery and the mid left anterior descending  . Cardiac catheterization  07/17/2010    EF estimated at 65% / Left ventricular angiography was performed in the RAO view  This demonstrates normal left ventricular size and contractility with normal  systolic function / Nonobstructive atherosclerotic coronary disease with continued  excellent patency of the stents in the mid LAD & mid right coronary artery /  Normal left ventricular function  . Colonoscopy    . Polypectomy      History  Smoking status  . Former Smoker -- 1.00 packs/day  . Types: Cigarettes  . Quit date: 09/22/2000  Smokeless tobacco  . Never Used    History  Alcohol Use  . 4.2 oz/week  . 7 Glasses of wine per week    Family History  Problem Relation Age of Onset  . Heart attack Father     x2  . Stroke Brother   . Coronary artery disease Brother   . Coronary artery disease Brother   . Prostate cancer Father   . Prostate cancer Paternal Uncle   . Diabetes Brother     Review of Systems: As noted in history of present illness. All other systems were reviewed and  are negative.  Physical Exam: BP 126/74 mmHg  Pulse 59  Ht 5\' 9"  (1.753 m)  Wt 107.162 kg (236 lb 4 oz)  BMI 34.87 kg/m2 He is an overweight white male in no acute distress. His HEENT exam is unremarkable. He has no JVD or bruits. No adenopathy or thyromegaly. Lungs are clear. Cardiac exam reveals a regular rate and rhythm without gallop or murmur. Abdomen is soft, nontender. He has good pedal pulses. There is no edema. Neuro he is alert oriented x3. Cranial nerves II through XII are intact.  LABORATORY DATA: ECG today demonstrates NSR, rate 59. First degree AV block. Otherwise normal. I have personally reviewed and interpreted this study.   Assessment / Plan: 1. Coronary disease. Status post stenting of the mid LAD and RCA in 2004 with DES. Cardiac catheterization in 2011 showed patent stents. Really no significant symptoms of angina. Will update Myoview study now. Anticipate that his Ecg will be falsely positive but nuclear study should be diagnostic.  2. HTN- well controlled.  3. Hypercholesterolemia. On Zetia, Zocor, fenofibrate and fenofibrate. Labs followed by Dr. Joylene Murphy. Considered for PCSK 9 inhibitor but doesn't think he can afford it.   4. Hypothyroidism.

## 2015-05-02 ENCOUNTER — Telehealth (HOSPITAL_COMMUNITY): Payer: Self-pay

## 2015-05-02 NOTE — Telephone Encounter (Signed)
Encounter complete. 

## 2015-05-03 ENCOUNTER — Telehealth (HOSPITAL_COMMUNITY): Payer: Self-pay

## 2015-05-03 NOTE — Telephone Encounter (Signed)
Encounter complete. 

## 2015-05-04 ENCOUNTER — Ambulatory Visit (HOSPITAL_COMMUNITY)
Admission: RE | Admit: 2015-05-04 | Discharge: 2015-05-04 | Disposition: A | Discharge status: Home / Self Care | Payer: BLUE CROSS/BLUE SHIELD | Source: Ambulatory Visit | Attending: Cardiovascular Disease | Admitting: Cardiovascular Disease

## 2015-05-04 DIAGNOSIS — I10 Essential (primary) hypertension: Secondary | ICD-10-CM | POA: Diagnosis not present

## 2015-05-04 DIAGNOSIS — Z8249 Family history of ischemic heart disease and other diseases of the circulatory system: Secondary | ICD-10-CM | POA: Diagnosis not present

## 2015-05-04 DIAGNOSIS — I2510 Atherosclerotic heart disease of native coronary artery without angina pectoris: Secondary | ICD-10-CM | POA: Insufficient documentation

## 2015-05-04 DIAGNOSIS — Z6834 Body mass index (BMI) 34.0-34.9, adult: Secondary | ICD-10-CM | POA: Insufficient documentation

## 2015-05-04 DIAGNOSIS — E669 Obesity, unspecified: Secondary | ICD-10-CM | POA: Insufficient documentation

## 2015-05-04 DIAGNOSIS — Z87891 Personal history of nicotine dependence: Secondary | ICD-10-CM | POA: Insufficient documentation

## 2015-05-04 LAB — MYOCARDIAL PERFUSION IMAGING
CHL CUP NUCLEAR SSS: 4
CHL CUP RESTING HR STRESS: 63 {beats}/min
Estimated workload: 10.1 METS
Exercise duration (min): 9 min
LVDIAVOL: 103 mL
LVSYSVOL: 41 mL
MPHR: 158 {beats}/min
Peak HR: 146 {beats}/min
Percent HR: 92 %
RPE: 15
SDS: 3
SRS: 1
TID: 1.04

## 2015-05-04 MED ORDER — TECHNETIUM TC 99M SESTAMIBI GENERIC - CARDIOLITE
10.3000 | Freq: Once | INTRAVENOUS | Status: AC | PRN
  Administered 2015-05-04: 10 via INTRAVENOUS

## 2015-05-04 MED ORDER — TECHNETIUM TC 99M SESTAMIBI GENERIC - CARDIOLITE
30.4000 | Freq: Once | INTRAVENOUS | Status: AC | PRN
  Administered 2015-05-04: 30 via INTRAVENOUS

## 2015-05-08 ENCOUNTER — Telehealth: Payer: Self-pay | Admitting: Cardiology

## 2015-05-08 NOTE — Telephone Encounter (Signed)
Calling to get the results of his stress test . Please call   Thanks

## 2015-05-08 NOTE — Telephone Encounter (Signed)
No answer

## 2015-08-10 ENCOUNTER — Encounter: Payer: Self-pay | Admitting: Cardiology

## 2016-09-01 ENCOUNTER — Institutional Professional Consult (permissible substitution): Payer: BLUE CROSS/BLUE SHIELD | Admitting: Neurology

## 2016-10-13 ENCOUNTER — Encounter: Payer: Self-pay | Admitting: Neurology

## 2016-10-13 ENCOUNTER — Ambulatory Visit (INDEPENDENT_AMBULATORY_CARE_PROVIDER_SITE_OTHER): Payer: 59 | Admitting: Neurology

## 2016-10-13 VITALS — BP 137/86 | HR 73 | Resp 20 | Ht 69.0 in | Wt 219.0 lb

## 2016-10-13 DIAGNOSIS — R0683 Snoring: Secondary | ICD-10-CM

## 2016-10-13 DIAGNOSIS — IMO0001 Reserved for inherently not codable concepts without codable children: Secondary | ICD-10-CM

## 2016-10-13 DIAGNOSIS — E6609 Other obesity due to excess calories: Secondary | ICD-10-CM

## 2016-10-13 DIAGNOSIS — I2510 Atherosclerotic heart disease of native coronary artery without angina pectoris: Secondary | ICD-10-CM | POA: Diagnosis not present

## 2016-10-13 NOTE — Progress Notes (Signed)
SLEEP MEDICINE CLINIC   Provider:  Larey Murphy, M D  Referring Provider: Crist Infante, MD Primary Care Physician:  Joseph Ly, MD  Chief Complaint  Patient presents with  . New Patient (Initial Visit)    had sleep study in the past, snores    HPI:  [name] is a 66 y.o. male , seen here as a referral  from Dr. Joylene Draft for a sleep consultation.  Chief complaint according to patient :" I am snoring " - " I am not sure that I have apnea "   [name] was diagnosed with coronary artery disease in his late 30s, he underwent angioplasty and stent placements. He also has hypothyroidism, hypercholesterolemia, hypertension. Over the last 18 month he has developed some cyclic recurrent insomnia, and he may go weeks fine in between.  1 week every 2 month, unaware of trigger factors. He has experienced waking up without an external cause,  Finds himself wide-awake after 2 or 3 hours of sleep. His family has multiple times mention to him that she snores and this has been a factor for many years, leaving at one time to a sleep study, maybe 15 or 20 years ago. At the time apnea was not found.   Sleep habits are as follows: [name] goes to bed around 11 PM, his bedroom is cool and quiet and dark, he is sleeping with a special memory foam pillow for neck support, as recommended by his orthopedist. He also will sleep in supine on the special pillow.  When he has insomnia , he wakes at 2 AM, at 3 AM or 4 AM. He has not ha insomnia for the last 14 days.  The patient has been asleep promptly, but not always sleeping through the night. He does not have nocturia. He is not woken by pain, palpitations, tightness of the chest. Mrs. Hamson has not noted him to be restless kicking or twitching or at least has not shared this information with her husband. He has tried melatonin and ginger tea, no prescription medication.  He rises at 6.30- & AM , without difficulties. Not dependent on an alarm.     Sleep medical history and family sleep history:  The patient has not undergone any ENT surgeries, there is no history of neck trauma or surgery or airway surgery.  Social history:  Married,  2 children, now 38 and 21 years old.  Full time gainfully employed, [employer] worker [employer] No history of shift work. Tobacco use- none ( quit 2004), ETOH , on weekends wine 2-3 glasses, caffeine use- no iced teas or sodas, but coffee( 4-6 cups), throughout the day. 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   Review of Systems: Out of a complete 14 system review, the patient complains of only the following symptoms, and all other reviewed systems are negative.   Epworth score 11 , Fatigue severity score 38  , depression score 6/15   Social History   Social History  . Marital status: Single    Spouse name: N/A  . Number of children: 2  . Years of education: N/A   Occupational History  . financial advisor [employer]   Social History Main Topics  . Smoking status: Former Smoker    Packs/day: 1.00    Types: Cigarettes    Quit date: 09/22/2000  . Smokeless tobacco: Never Used  . Alcohol use 4.2 oz/week    7 Glasses of wine per week  . Drug use: No  . Sexual  activity: Not on file   Other Topics Concern  . Not on file   Social History Narrative  . No narrative on file    Family History  Problem Relation Age of Onset  . Heart attack Father     x2  . Prostate cancer Father   . Pancreatic cancer Mother   . Stroke Brother   . Coronary artery disease Brother   . Coronary artery disease Brother   . Prostate cancer Paternal Uncle   . Diabetes Brother     Past Medical History:  Diagnosis Date  . Benign paroxysmal positional vertigo   . Coronary artery disease 2004   with prior stenting of the mid LAD and mid right coronary  / cardiac cath with satifsfactory finding following abnormal stress test  . Depressive disorder, not elsewhere classified   . Gastritis   . Gluten enteropathy   . Heart  palpitations    went to ER on 04/27/2006 for palpitations  . Hypercholesteremia   . Hypertension   . Hypothyroidism   . Iron deficiency anemia, unspecified   . Myalgia and myositis, unspecified   . Personal history of colonic polyps 10/2006   hyperplastic polyp  . SOB (shortness of breath)     Past Surgical History:  Procedure Laterality Date  . CARDIAC CATHETERIZATION  08/25/2003   EF estimated 65% / stenting of the mid left  anterior descending and mid right coronary artery in 2004 with drug-eluting stent/Two vessel obstructive atherosclerotic coronary artery disease. / Normal left ventricular function. / Successful stenting of the mid right coronary artery and the mid left anterior descending  . CARDIAC CATHETERIZATION  07/17/2010   EF estimated at 65% / Left ventricular angiography was performed in the RAO view  This demonstrates normal left ventricular size and contractility with normal  systolic function / Nonobstructive atherosclerotic coronary disease with continued  excellent patency of the stents in the mid LAD & mid right coronary artery / Normal left ventricular function  . COLONOSCOPY    . OSTEOTOMY     removal of bone spur on (L) arm  . POLYPECTOMY      Current Outpatient Prescriptions  Medication Sig Dispense Refill  . amLODipine (NORVASC) 5 MG tablet Take 1 tablet by mouth daily.    [name] aspirin 81 MG tablet Take 81 mg by mouth daily.    [name] buPROPion (WELLBUTRIN XL) 150 MG 24 hr tablet Take 150 mg by mouth daily.     . cyanocobalamin (,VITAMIN B-12,) 1000 MCG/ML injection Inject 1,000 mcg into the muscle every 30 (thirty) days.      [name] ezetimibe (ZETIA) 10 MG tablet Take 10 mg by mouth daily.      . fenofibrate 160 MG tablet Take 160 mg by mouth daily.      [name] ketoconazole (NIZORAL) 2 % shampoo as directed.    [name] levothyroxine (SYNTHROID, LEVOTHROID) 200 MCG tablet Take 200-300 mcg by mouth daily.     . ramipril (ALTACE) 10 MG capsule Take 10 mg by mouth daily.    . sildenafil  (REVATIO) 20 MG tablet Take 20 mg by mouth daily as needed.    . simvastatin (ZOCOR) 20 MG tablet Take 20 mg by mouth every evening.    . TESTOSTERONE TD Inject 2 mLs as directed as directed.      No current facility-administered medications for this visit.     Allergies as of 10/13/2016  . (No Known Allergies)    Vitals: BP 137/86  Pulse 73   Resp 20   Ht 5\' 9"  (1.753 m)   Wt 219 lb (99.3 kg)   BMI 32.34 kg/m  Last Weight:  Wt Readings from Last 1 Encounters:  10/13/16 219 lb (99.3 kg)   [id number] mass index is 32.34 kg/m.     Last Height:   Ht Readings from Last 1 Encounters:  10/13/16 5\' 9"  (1.753 m)    Physical exam:  General: The patient is awake, alert and appears not in acute distress. The patient is well groomed. Head: Normocephalic, atraumatic. Neck is supple. Mallampati 3,  neck circumference: 19 inches. Nasal airflow restricted,  Retrognathia is not seen.  Cardiovascular:  Regular rate and rhythm , without  murmurs or carotid bruit, and without distended neck veins. Respiratory: Lungs are clear to auscultation. Skin:  Without evidence of edema, or rash Trunk: BMI is elevated . The patient's posture is erect   Neurologic exam : The patient is awake and alert, oriented to place and time.   Memory subjective  described as intact.  Attention span & concentration ability appears normal.  Speech is fluent, without dysarthria, dysphonia or aphasia.  Mood and affect are appropriate.  Cranial nerves: Pupils are equal and briskly reactive to light.  Extraocular movements  in vertical and horizontal planes intact and without nystagmus. Visual fields by finger perimetry are intact. Hearing to finger rub intact.   Facial sensation intact to fine touch.  Facial motor strength is symmetric and tongue and uvula move midline. Shoulder shrug was symmetrical.   Motor exam:   Normal tone, muscle bulk and symmetric strength in all extremities.  Sensory:  Fine touch,  pinprick and vibration were tested in all extremities.  Proprioception tested in the upper extremities was normal.  Coordination: Rapid alternating movements in the fingers/hands was normal. Finger-to-nose maneuver  normal without evidence of ataxia, dysmetria or tremor.  Gait and station: Patient walks without assistive device and is able unassisted to climb up to the exam table. Strength within normal limits.  Stance is stable and normal. Tandem gait is unfragmented. Turns with 3 Steps. Romberg testing is negative.  Deep tendon reflexes: in the  upper and lower extremities are symmetric and intact. Babinski maneuver response is downgoing.  The patient was advised of the nature of the diagnosed sleep disorder , the treatment options and risks for general a health and wellness arising from not treating the condition.  I spent more than 45 minutes of face to face time with the patient. Greater than 50% of time was spent in counseling and coordination of care. We have discussed the diagnosis and differential and I answered the patient's questions.     Assessment:  After physical and neurologic examination, review of laboratory studies,  Personal review of imaging studies, reports of other /same  Imaging studies ,  Results of polysomnography/ neurophysiology testing and pre-existing records as far as provided in visit., my assessment is   1) Mr. Molitoris is well known to snore, he has a history of coronary artery disease at a young age, he has a twin brother that required triple bypass surgery at age 87. There has been no report of witnessed apnea, restless legs nocturnal palpitations or diaphoresis in the last couple of years. I still think that he is at a higher risk of having obstructive sleep apnea given that he has gained weight, that his neck circumference is now 19 inches,  His am party is high-grade, his uvula is swollen, and red  and he does not report acid reflux as a cause.   Plan:  Treatment  plan and additional workup :  attended Split night study preferred , but [employer] may not permit.  RV after study .      Asencion Partridge Daylyn Christine MD  10/13/2016   CC: [name], [place],  [id number]

## 2016-10-13 NOTE — Patient Instructions (Signed)
Sleep Apnea Sleep apnea is a condition that affects breathing. People with sleep apnea have moments during sleep when their breathing pauses briefly or gets shallow. Sleep apnea can cause these symptoms:  Trouble staying asleep.  Sleepiness or tiredness during the day.  Irritability.  Loud snoring.  Morning headaches.  Trouble concentrating.  Forgetting things.  Less interest in sex.  Being sleepy for no reason.  Mood swings.  Personality changes.  Depression.  Waking up a lot during the night to pee (urinate).  Dry mouth.  Sore throat. Follow these instructions at home:  Make any changes in your routine that your doctor recommends.  Eat a healthy, well-balanced diet.  Take over-the-counter and prescription medicines only as told by your doctor.  Avoid using alcohol, calming medicines (sedatives), and narcotic medicines.  Take steps to lose weight if you are overweight.  If you were given a machine (device) to use while you sleep, use it only as told by your doctor.  Do not use any tobacco products, such as cigarettes, chewing tobacco, and e-cigarettes. If you need help quitting, ask your doctor.  Keep all follow-up visits as told by your doctor. This is important. Contact a doctor if:  The machine that you were given to use during sleep is uncomfortable or does not seem to be working.  Your symptoms do not get better.  Your symptoms get worse. Get help right away if:  Your chest hurts.  You have trouble breathing in enough air (shortness of breath).  You have an uncomfortable feeling in your back, arms, or stomach.  You have trouble talking.  One side of your body feels weak.  A part of your face is hanging down (drooping). These symptoms may be an emergency. Do not wait to see if the symptoms will go away. Get medical help right away. Call your local emergency services (911 in the [place] Do not drive yourself to the hospital.  This information  is not intended to replace advice given to you by your health care provider. Make sure you discuss any questions you have with your health care provider. Document Released: 06/17/2008 Document Revised: 05/04/2016 Document Reviewed: 06/18/2015 Elsevier Interactive Patient Education  2017 [place].  Please remember to try to maintain good sleep hygiene, which means: Keep a regular sleep and wake schedule, try not to exercise or have a meal within 2 hours of your bedtime, try to keep your bedroom conducive for sleep, that is, cool and dark, without light distractors such as an illuminated alarm clock, and refrain from watching TV right before sleep or in the middle of the night and do not keep the TV or radio on during the night. Also, try not to use or play on electronic devices at bedtime, such as your cell phone, tablet PC or laptop. If you like to read at bedtime on an electronic device, try to dim the background light as much as possible. Do not eat in the middle of the night.   We will request a sleep study.    We will look for leg twitching and snoring or sleep apnea.   For chronic insomnia, you are best followed by a psychiatrist and/or sleep psychologist.   We will call you with the sleep study results and make a follow up appointment if needed.

## 2016-10-27 ENCOUNTER — Ambulatory Visit (INDEPENDENT_AMBULATORY_CARE_PROVIDER_SITE_OTHER): Payer: 59 | Admitting: Neurology

## 2016-10-27 DIAGNOSIS — R0683 Snoring: Secondary | ICD-10-CM

## 2016-10-27 DIAGNOSIS — I2510 Atherosclerotic heart disease of native coronary artery without angina pectoris: Secondary | ICD-10-CM

## 2016-10-27 DIAGNOSIS — G4733 Obstructive sleep apnea (adult) (pediatric): Secondary | ICD-10-CM

## 2016-10-27 DIAGNOSIS — IMO0001 Reserved for inherently not codable concepts without codable children: Secondary | ICD-10-CM

## 2016-10-30 ENCOUNTER — Encounter: Payer: Self-pay | Admitting: Neurology

## 2016-10-30 ENCOUNTER — Telehealth: Payer: Self-pay | Admitting: Neurology

## 2016-10-30 DIAGNOSIS — G4733 Obstructive sleep apnea (adult) (pediatric): Secondary | ICD-10-CM

## 2016-10-30 NOTE — Procedures (Signed)
[facility] [address] [place] [address] NAME: [name] DOB: 05/18/1951 MEDICAL RECORD [id number] DOS: 10/27/16 REFERRING PHYSICIAN: Crist Infante, MD Study performed: HST/Out of Center Sleep test HISTORY: [name] is a 66 y.o. male with a chief complaint of snoring. Mr. Lighter was diagnosed with coronary artery disease in his late 20s, he underwent angioplasty and stent placements. He also has hypothyroidism, hypercholesterolemia, and hypertension. Over the last 18 month he has developed some cyclic recurrent insomnia, and he may go for weeks fine in between, averaging 1 week every 2 month, and he is unaware of trigger factors. He has experienced waking up without an external cause, being wide-awake after 2 or 3 hours of sleep. He had a previous sleep study, maybe 15 or 20 years ago, but apnea was not found.  He has tried melatonin and ginger tea, no prescription medication.    Epworth Sleepiness Score endorsed at 11 points, Fatigue severity score at 38, depression score 6/15. BMI is 32.  STUDY RESULTS: Total Recording Time: 7h 24 m with 2h 7 m of evaluation time. Total Apnea/Hypopnea Index (AHI) 7.1/hr. Average Oxygen Saturation: 92% Spo2, Lowest Oxygen Saturation: 83% Sp02, 19 minutes of desaturation time at or below 89%, Average Heart Rate: 63 bpm , tachycardia SR.  IMPRESSION: Mild OSA is present and associated with snoring. There is tachy-brady arrhythmia present in clusters to degree that is concerning for REM dependent apnea. RECOMMENDATION: attended sleep study with titration. I would prefer a 1 hour baseline before titration.    I certify that I have reviewed the raw data recording prior to the issuance of this report in accordance with the standards of Accreditation of the American Academy of Sleep medicine (AASM) Larey Seat, MD   10-29-2016 Diplomat, American Board of Psychiatry and Neurology, Diplomat, [place] of  Sleep Medicine Medical Director of [employer] [facility]

## 2016-10-30 NOTE — Telephone Encounter (Signed)
Summary of HST,  10-27-2016  Total Recording Time: 7h 30 m with 2h 7 m of evaluation time. Total Apnea/Hypopnea Index (AHI) 7.1/hr. Average Oxygen Saturation: 92% Spo2, Lowest Oxygen Saturation: 83% Sp02, 19 minutes of desaturation time at or below 89%, Average Heart Rate: 63 bpm , tachycardia SR.  IMPRESSION: Mild OSA is present and associated with snoring. There is tachy-brady arrhythmia present in clusters to degree that is concerning for REM dependent apnea. RECOMMENDATION: attended sleep study with titration. I would prefer a 1 hour baseline before titration.  CD

## 2016-11-03 ENCOUNTER — Telehealth: Payer: Self-pay | Admitting: Neurology

## 2016-11-03 DIAGNOSIS — G4733 Obstructive sleep apnea (adult) (pediatric): Secondary | ICD-10-CM

## 2016-11-03 NOTE — Telephone Encounter (Signed)
[employer] denied CPAP suggest Auto pap.  Please order autopap

## 2016-11-04 NOTE — Telephone Encounter (Signed)
LM for patient to call back for results.   Looks like per other phone note, pt approved for autopap not titration study.

## 2016-11-04 NOTE — Telephone Encounter (Signed)
-----   Message from Larey Seat, MD sent at 10/30/2016  5:16 PM EST ----- Total Recording Time: 7h 24 m with 2h 7 m of evaluation time. Total Apnea/Hypopnea Index (AHI) 7.1/hr. Average Oxygen Saturation: 92% Spo2, Lowest Oxygen Saturation: 83% Sp02, 19 minutes of desaturation time at or below 89%, Average Heart Rate: 63 bpm , tachycardia SR.  IMPRESSION: Mild OSA is present and associated with snoring. There is tachy-brady arrhythmia present in clusters to degree that is concerning for REM dependent apnea. RECOMMENDATION: attended sleep study with titration. I would prefer a 1 hour baseline before titration.

## 2016-11-04 NOTE — Telephone Encounter (Signed)
Patient called office returning nurse's call.  Please call °

## 2016-11-04 NOTE — Telephone Encounter (Signed)
Called back and left vm to call back

## 2016-11-04 NOTE — Telephone Encounter (Signed)
auto CPAP ordered. 5-20 cm water , 3 cm EPR,

## 2016-11-04 NOTE — Telephone Encounter (Signed)
Patient returning your call.

## 2016-11-05 NOTE — Telephone Encounter (Signed)
Patient called back, I was able to give results. Patient did not want to start PAP treatment yet. He would like to discuss results further with Dr. Brett Fairy

## 2016-12-17 DIAGNOSIS — M25562 Pain in left knee: Secondary | ICD-10-CM | POA: Insufficient documentation

## 2016-12-17 DIAGNOSIS — G8929 Other chronic pain: Secondary | ICD-10-CM | POA: Insufficient documentation

## 2016-12-29 ENCOUNTER — Encounter (INDEPENDENT_AMBULATORY_CARE_PROVIDER_SITE_OTHER): Payer: Self-pay

## 2016-12-29 ENCOUNTER — Other Ambulatory Visit: Payer: Self-pay | Admitting: Orthopedic Surgery

## 2016-12-29 ENCOUNTER — Ambulatory Visit (INDEPENDENT_AMBULATORY_CARE_PROVIDER_SITE_OTHER): Payer: 59 | Admitting: Neurology

## 2016-12-29 ENCOUNTER — Encounter: Payer: Self-pay | Admitting: Neurology

## 2016-12-29 VITALS — BP 158/76 | HR 78 | Resp 16 | Ht 69.0 in | Wt 230.0 lb

## 2016-12-29 DIAGNOSIS — G4733 Obstructive sleep apnea (adult) (pediatric): Secondary | ICD-10-CM

## 2016-12-29 DIAGNOSIS — E669 Obesity, unspecified: Secondary | ICD-10-CM

## 2016-12-29 DIAGNOSIS — J301 Allergic rhinitis due to pollen: Secondary | ICD-10-CM | POA: Diagnosis not present

## 2016-12-29 DIAGNOSIS — R0683 Snoring: Secondary | ICD-10-CM

## 2016-12-29 DIAGNOSIS — R609 Edema, unspecified: Secondary | ICD-10-CM

## 2016-12-29 DIAGNOSIS — R52 Pain, unspecified: Secondary | ICD-10-CM

## 2016-12-29 DIAGNOSIS — R531 Weakness: Secondary | ICD-10-CM

## 2016-12-29 MED ORDER — MOMETASONE FUROATE 50 MCG/ACT NA SUSP: 2.0000 | Freq: Every day | NASAL | 12 refills | Status: DC

## 2016-12-29 NOTE — Progress Notes (Signed)
SLEEP MED[facility]INE CLINIC   Provider:  Larey Seat, M D  Referring Provider: Crist Infante, MD Primary Care Physician:  Jerlyn Ly, MD  Chief Complaint  Patient presents with  . Follow-up    Rm 10 Patient is here to discuss his sleep study     HPI:  [name] is a 66 y.o. male , seen here as a referral  from Dr. Joylene Draft for a sleep consultation.  Chief complaint according to patient :" I am snoring " - " I am not sure that I have apnea "   [name] was diagnosed with coronary artery disease in his late 39s, he underwent angioplasty and stent placements. He also has hypothyroidism, hypercholesterolemia, hypertension. Over the last 18 month he has developed some cyclic recurrent insomnia, and he may go weeks fine in between.  1 week every 2 month, unaware of trigger factors. He has experienced waking up without an external cause,  Finds himself wide-awake after 2 or 3 hours of sleep. His family has multiple times mention to him that she snores and this has been a factor for many years, leaving at one time to a sleep study, maybe 15 or 20 years ago. At the time apnea was not found.   Sleep habits are as follows: [name] goes to bed around 11 PM, his bedroom is cool and quiet and dark, he is sleeping with a special memory foam pillow for neck support, as recommended by his orthopedist. He also will sleep in supine on the special pillow.  When he has insomnia , he wakes at 2 AM, at 3 AM or 4 AM. He has not ha insomnia for the last 14 days.  The patient has been asleep promptly, but not always sleeping through the night. He does not have nocturia. He is not woken by pain, palpitations, tightness of the chest. Mrs. Murphy has not noted him to be restless kicking or twitching or at least has not shared this information with her husband. He has tried melatonin and ginger tea, no prescription medication.  He rises at 6.30- & AM , without difficulties. Not dependent on an alarm.  Sleep  medical history and family sleep history: The patient has not undergone any ENT surgeries, there is no history of neck trauma or surgery or airway surgery. Social history:  Married,  2 children, now 21 and 35 years old.  Full time gainfully employed, Advice worker for [employer] No history of shift work. Tobacco use- none ( quit 2004), ETOH , on weekends wine 2-3 glasses, caffeine use- no iced teas or sodas, but coffee( 4-6 cups), throughout the day. 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   Interval history from 12/29/2016 I have the pleasure of seeing [name] today after he had undergone a home sleep test on 10/27/2016 with a very mild AHI of 7.119 minutes of desaturation time totally sinus rhythm maintained throughout the night and a high likelihood that it was a loud snorer throwing the night. [name] only has very mild obstructive sleep apnea associated with snoring however never clusters of fast and slow heart rates noted between 2 and 3 AM and at 5 AM. Likely representing a REM dependent apnea and REM dependent apnea usually is treated by CPAP. However with the overall to very mild degree of apnea I would leave it to the patient if he wants to be treated. I would feel comfortable to refer him to a dentist as well treating his snoring and reducing the  overall apnea probably by about 20-50% which in his case means that he is at a normal physiologically expected apnea count.He is intending to go on a diet and this will help the most !   Review of Systems: Out of a complete 14 system review, the patient complains of only the following symptoms, and all other reviewed systems are negative.   Epworth score still 11 , Fatigue severity score  now 36 from38  , depression score 6/15   Social History   Social History  . Marital status: Single    Spouse name: N/A  . Number of children: 2  . Years of education: N/A   Occupational History  . financial advisor [employer]   Social History Main Topics  . Smoking  status: Former Smoker    Packs/day: 1.00    Types: Cigarettes    Quit date: 09/22/2000  . Smokeless tobacco: Never Used  . Alcohol use 4.2 oz/week    7 Glasses of wine per week  . Drug use: No  . Sexual activity: Not on file   Other Topics Concern  . Not on file   Social History Narrative  . No narrative on file    Family History  Problem Relation Age of Onset  . Heart attack Father     x2  . Prostate cancer Father   . Pancreatic cancer Mother   . Stroke Brother   . Coronary artery disease Brother   . Coronary artery disease Brother   . Prostate cancer Paternal Uncle   . Diabetes Brother     Past Medical History:  Diagnosis Date  . Benign paroxysmal positional vertigo   . Coronary artery disease 2004   with prior stenting of the mid LAD and mid right coronary  / cardiac cath with satifsfactory finding following abnormal stress test  . Depressive disorder, not elsewhere classified   . Gastritis   . Gluten enteropathy   . Heart palpitations    went to ER on 04/27/2006 for palpitations  . Hypercholesteremia   . Hypertension   . Hypothyroidism   . Iron deficiency anemia, unspecified   . Myalgia and myositis, unspecified   . Personal history of colonic polyps 10/2006   hyperplastic polyp  . SOB (shortness of breath)     Past Surgical History:  Procedure Laterality Date  . CARDIAC CATHETERIZATION  08/25/2003   EF estimated 65% / stenting of the mid left  anterior descending and mid right coronary artery in 2004 with drug-eluting stent/Two vessel obstructive atherosclerotic coronary artery disease. / Normal left ventricular function. / Successful stenting of the mid right coronary artery and the mid left anterior descending  . CARDIAC CATHETERIZATION  07/17/2010   EF estimated at 65% / Left ventricular angiography was performed in the RAO view  This demonstrates normal left ventricular size and contractility with normal  systolic function / Nonobstructive atherosclerotic  coronary disease with continued  excellent patency of the stents in the mid LAD & mid right coronary artery / Normal left ventricular function  . COLONOSCOPY    . OSTEOTOMY     removal of bone spur on (L) arm  . POLYPECTOMY      Current Outpatient Prescriptions  Medication Sig Dispense Refill  . amLODipine (NORVASC) 5 MG tablet Take 1 tablet by mouth daily.    [name] aspirin 81 MG tablet Take 81 mg by mouth daily.    [name] buPROPion (WELLBUTRIN XL) 150 MG 24 hr tablet Take 150 mg by mouth  daily.     . co-enzyme Q-10 30 MG capsule Take 30 mg by mouth 3 (three) times daily.    . cyanocobalamin (,VITAMIN B-12,) 1000 MCG/ML injection Inject 1,000 mcg into the muscle every 30 (thirty) days.      [name] ezetimibe (ZETIA) 10 MG tablet Take 10 mg by mouth daily.      . fenofibrate 160 MG tablet Take 160 mg by mouth daily.      [name] ketoconazole (NIZORAL) 2 % shampoo as directed.    [name] levothyroxine (SYNTHROID, LEVOTHROID) 200 MCG tablet Take 200-300 mcg by mouth daily.     . ramipril (ALTACE) 10 MG capsule Take 10 mg by mouth daily.    . sildenafil (REVATIO) 20 MG tablet Take 20 mg by mouth daily as needed.    . simvastatin (ZOCOR) 20 MG tablet Take 20 mg by mouth every evening.    . TESTOSTERONE TD Inject 2 mLs as directed as directed.      No current facility-administered medications for this visit.     Allergies as of 12/29/2016  . (No Known Allergies)    Vitals: BP (!) 158/76   Pulse 78   Resp 16   Ht 5\' 9"  (1.753 m)   Wt 230 lb (104.3 kg)   BMI 33.97 kg/m  Last Weight:  Wt Readings from Last 1 Encounters:  12/29/16 230 lb (104.3 kg)   AOZ:HYQM mass index is 33.97 kg/m.     Last Height:   Ht Readings from Last 1 Encounters:  12/29/16 5\' 9"  (1.753 m)    Physical exam:  General: The patient is awake, alert and appears not in acute distress. The patient is well groomed. Head: Normocephalic, atraumatic. Neck is supple. Mallampati 3,  neck circumference: 19 inches. Nasal airflow severely  congested,  Retrognathia is not seen.  Cardiovascular:  Regular rate and rhythm , without  murmurs or carotid bruit, and without distended neck veins. Respiratory: Lungs are clear to auscultation. Skin:  Without evidence of edema, or rash Trunk: BMI is elevated . The patient's posture is erect   Neurologic exam : The patient is awake and alert, oriented to place and time.    Mood and affect are appropriate.  Cranial nerves: Pupils are equal and briskly reactive to light.  Extraocular movements  in vertical and horizontal planes intact and without nystagmus. Visual fields by finger perimetry are intact. Hearing to finger rub intact.   Facial sensation intact to fine touch.  Facial motor strength is symmetric and tongue and uvula move midline. Shoulder shrug was symmetrical.  Deep tendon reflexes: in the  upper and lower extremities are symmetric and intact. Babinski maneuver response is downgoing.  The patient was advised of the nature of the diagnosed sleep disorder , the treatment options and risks for general a health and wellness arising from not treating the condition.  I spent more than 45 minutes of face to face time with the patient. Greater than 50% of time was spent in counseling and coordination of care. We have discussed the diagnosis and differential and I answered the patient's questions.     Assessment:  After physical and neurologic examination, review of laboratory studies,  Personal review of imaging studies, reports of other /same  Imaging studies ,  Results of polysomnography/ neurophysiology testing and pre-existing records as far as provided in visit., my assessment is   1)  Snoring, mild, possibly REM dependent apnea and allergic rhinitis causing the patient to mouth-breathe.  Plan:  Treatment plan and additional workup : As described above I would consider [name] a reasonable candidate for a dental device that advances the mandible. The idea is to open the  posterior airway   I will also offer him Nasonex or Nasacort to reduce his need to breathe through the mouth. The spray is meant to be used as needed during allergy season.  Referral to sleep dentist, either Oneal Grout or Augustina Mood.     Asencion Partridge Savier Trickett MD  12/29/2016   CC: Crist Infante, [place], [place] [id number]

## 2017-01-08 ENCOUNTER — Ambulatory Visit
Admission: RE | Admit: 2017-01-08 | Discharge: 2017-01-08 | Disposition: A | Discharge status: Home / Self Care | Payer: 59 | Source: Ambulatory Visit | Attending: Orthopedic Surgery | Admitting: Orthopedic Surgery

## 2017-01-08 DIAGNOSIS — R52 Pain, unspecified: Secondary | ICD-10-CM

## 2017-01-08 DIAGNOSIS — R609 Edema, unspecified: Secondary | ICD-10-CM

## 2017-01-08 DIAGNOSIS — R531 Weakness: Secondary | ICD-10-CM

## 2017-01-20 DIAGNOSIS — Z9889 Other specified postprocedural states: Secondary | ICD-10-CM | POA: Insufficient documentation

## 2017-04-02 DIAGNOSIS — H9190 Unspecified hearing loss, unspecified ear: Secondary | ICD-10-CM | POA: Insufficient documentation

## 2017-04-02 DIAGNOSIS — H6982 Other specified disorders of Eustachian tube, left ear: Secondary | ICD-10-CM | POA: Insufficient documentation

## 2017-08-03 ENCOUNTER — Encounter: Payer: Self-pay | Admitting: Cardiology

## 2017-08-04 DIAGNOSIS — E538 Deficiency of other specified B group vitamins: Secondary | ICD-10-CM | POA: Diagnosis not present

## 2017-08-04 DIAGNOSIS — E298 Other testicular dysfunction: Secondary | ICD-10-CM | POA: Diagnosis not present

## 2017-08-17 ENCOUNTER — Ambulatory Visit: Payer: Self-pay | Admitting: Cardiology

## 2017-08-17 NOTE — Progress Notes (Signed)
[name] Date of Birth: Apr 30, 1951   History of Present Illness: [name] is seen for followup of his coronary disease. Last seen in August 2016.  He does have a history of anemia, low B12, and testosterone levels. He has a history of stenting of the mid LAD and mid right coronary in 2004 with DES. In 2011 he had a nuclear stress test. He was able to exercise for 8 minutes with significant ST segment changes. Perfusion imaging was normal. He did undergo repeat cardiac catheterization which showed nonobstructive disease and continued excellent patency of his stents. Repeat myoview in 2016 still showed ST segment changes but normal perfusion and normal EF.   He was seen earlier this year by Dr. Brett Fairy for evaluation of snoring and REM dependent apnea. Fitted for a dental device.   On follow up today he is doing well. Denies any chest pain or SOB. No palpitations. He is now working for [employer] in [place]. He did have knee surgery this past summer. He has swelling in his left leg and pain behind his knee.   Current Outpatient Medications on File Prior to Visit  Medication Sig Dispense Refill  . amLODipine (NORVASC) 5 MG tablet Take 1 tablet by mouth daily.    [name] aspirin 81 MG tablet Take 81 mg by mouth daily.    [name] buPROPion (WELLBUTRIN XL) 150 MG 24 hr tablet Take 150 mg by mouth daily.     [name] co-enzyme Q-10 30 MG capsule Take 30 mg by mouth 3 (three) times daily.    . cyanocobalamin (,VITAMIN B-12,) 1000 MCG/ML injection Inject 1,000 mcg into the muscle every 30 (thirty) days.      [name] ezetimibe (ZETIA) 10 MG tablet Take 10 mg by mouth daily.      [name] ketoconazole (NIZORAL) 2 % shampoo as directed.    [name] levothyroxine (SYNTHROID, LEVOTHROID) 200 MCG tablet Take 200-300 mcg by mouth daily.     . ramipril (ALTACE) 10 MG capsule Take 10 mg by mouth daily.    . sildenafil (REVATIO) 20 MG tablet Take 20 mg by mouth daily as needed.    . simvastatin (ZOCOR) 20 MG tablet Take 20 mg by  mouth every evening.    . TESTOSTERONE TD Inject 2 mLs as directed as directed.      No current facility-administered medications on file prior to visit.     No Known Allergies  Past Medical History:  Diagnosis Date  . Benign paroxysmal positional vertigo   . Coronary artery disease 2004   with prior stenting of the mid LAD and mid right coronary  / cardiac cath with satifsfactory finding following abnormal stress test  . Depressive disorder, not elsewhere classified   . Gastritis   . Gluten enteropathy   . Heart palpitations    went to ER on 04/27/2006 for palpitations  . Hypercholesteremia   . Hypertension   . Hypothyroidism   . Iron deficiency anemia, unspecified   . Myalgia and myositis, unspecified   . Personal history of colonic polyps 10/2006   hyperplastic polyp  . SOB (shortness of breath)     Past Surgical History:  Procedure Laterality Date  . CARDIAC CATHETERIZATION  08/25/2003   EF estimated 65% / stenting of the mid left  anterior descending and mid right coronary artery in 2004 with drug-eluting stent/Two vessel obstructive atherosclerotic coronary artery disease. / Normal left ventricular function. / Successful stenting of the mid right coronary artery and the mid  left anterior descending  . CARDIAC CATHETERIZATION  07/17/2010   EF estimated at 65% / Left ventricular angiography was performed in the RAO view  This demonstrates normal left ventricular size and contractility with normal  systolic function / Nonobstructive atherosclerotic coronary disease with continued  excellent patency of the stents in the mid LAD & mid right coronary artery / Normal left ventricular function  . COLONOSCOPY    . OSTEOTOMY     removal of bone spur on (L) arm  . POLYPECTOMY      Social History   Tobacco Use  Smoking Status Former Smoker  . Packs/day: 1.00  . Types: Cigarettes  . Last attempt to quit: 09/22/2000  . Years since quitting: 16.9  Smokeless Tobacco Never Used     Social History   Substance and Sexual Activity  Alcohol Use Yes  . Alcohol/week: 4.2 oz  . Types: 7 Glasses of wine per week    Family History  Problem Relation Age of Onset  . Heart attack Father        x2  . Prostate cancer Father   . Pancreatic cancer Mother   . Stroke Brother   . Coronary artery disease Brother   . Coronary artery disease Brother   . Prostate cancer Paternal Uncle   . Diabetes Brother     Review of Systems: As noted in history of present illness. All other systems were reviewed and are negative.  Physical Exam: BP 140/80   Pulse 68   Ht 5\' 9"  (1.753 m)   Wt 229 lb 12.8 oz (104.2 kg)   SpO2 97%   BMI 33.94 kg/m  GENERAL:  Well appearing, overweight WM HEENT:  PERRL, EOMI, sclera are clear. Oropharynx is clear. NECK:  No jugular venous distention, carotid upstroke brisk and symmetric, no bruits, no thyromegaly or adenopathy LUNGS:  Clear to auscultation bilaterally CHEST:  Unremarkable HEART:  RRR,  PMI not displaced or sustained,S1 and S2 within normal limits, no S3, no S4: no clicks, no rubs, no murmurs ABD:  Soft, nontender. BS +, no masses or bruits. No hepatomegaly, no splenomegaly EXT:  2 + pulses throughout, left leg with 1-2+ edema, nontender.  no cyanosis no clubbing SKIN:  Warm and dry.  No rashes NEURO:  Alert and oriented x 3. Cranial nerves II through XII intact. PSYCH:  Cognitively intact    LABORATORY DATA:  Dated 08/20/16: cholesterol 146, triglycerides 127, HDL 35, LDL 86. Chemistries, TSH, CBC normal. Dated 02/24/17 A1c 5.2%.  ECG today demonstrates NSR, rate 76. First degree AV block. Otherwise normal. I have personally reviewed and interpreted this study.  Myoview 05/04/15: Study Highlights    The left ventricular ejection fraction is normal (55-65%).  Nuclear stress EF: 61%.  Blood pressure demonstrated a hypertensive response to exercise.  Horizontal ST segment depression ST segment depression of 2 mm was noted  during stress in the III, aVF and II leads.  The study is normal.  This is a low risk study.   Low risk stress nuclear study with normal perfusion and normal left ventricular regional and global systolic function. Abnormal ECG response probably represents a "false positive" study, possibly related to hypertensive response to exercise.     Assessment / Plan: 1. Coronary disease. Status post stenting of the mid LAD and RCA in 2004 with DES. Cardiac catheterization in 2011 showed patent stents. Myoview in 2016 was normal. He is asymptomatic. Continue medical therapy and risk factor modification.   2. HTN- well  controlled.  3. Hypercholesterolemia. On Zetia and Zocor. ideally goal LDL 70. Labs followed by Dr. Joylene Draft and he is due to follow up. States  PCSK 9 inhibitor is unaffordable.   4. Hypothyroidism.  5. Left leg swelling and pain. Suspect he may have a Baker's cyst but will check LE venous dopplers to make sure he doesn't have a DVT.   I will follow up in one year.

## 2017-08-20 ENCOUNTER — Ambulatory Visit (HOSPITAL_COMMUNITY)
Admission: RE | Admit: 2017-08-20 | Discharge: 2017-08-20 | Disposition: A | Discharge status: Home / Self Care | Payer: Medicare Other | Source: Ambulatory Visit | Attending: Cardiovascular Disease | Admitting: Cardiovascular Disease

## 2017-08-20 ENCOUNTER — Encounter: Payer: Self-pay | Admitting: Cardiology

## 2017-08-20 ENCOUNTER — Ambulatory Visit (INDEPENDENT_AMBULATORY_CARE_PROVIDER_SITE_OTHER): Payer: Medicare Other | Admitting: Cardiology

## 2017-08-20 VITALS — BP 140/80 | HR 68 | Ht 69.0 in | Wt 229.8 lb

## 2017-08-20 DIAGNOSIS — R600 Localized edema: Secondary | ICD-10-CM

## 2017-08-20 DIAGNOSIS — I10 Essential (primary) hypertension: Secondary | ICD-10-CM

## 2017-08-20 DIAGNOSIS — E7800 Pure hypercholesterolemia, unspecified: Secondary | ICD-10-CM | POA: Diagnosis not present

## 2017-08-20 DIAGNOSIS — I2510 Atherosclerotic heart disease of native coronary artery without angina pectoris: Secondary | ICD-10-CM | POA: Insufficient documentation

## 2017-08-20 NOTE — Patient Instructions (Signed)
We will schedule you for lower extremity venous dopplers  Continue your other therapy. Get follow up lab work with Dr. Joylene Draft  I will see you in one year.

## 2017-09-29 DIAGNOSIS — Z23 Encounter for immunization: Secondary | ICD-10-CM | POA: Diagnosis not present

## 2017-10-28 DIAGNOSIS — E538 Deficiency of other specified B group vitamins: Secondary | ICD-10-CM | POA: Diagnosis not present

## 2017-10-28 DIAGNOSIS — E298 Other testicular dysfunction: Secondary | ICD-10-CM | POA: Diagnosis not present

## 2017-11-12 DIAGNOSIS — I10 Essential (primary) hypertension: Secondary | ICD-10-CM | POA: Diagnosis not present

## 2017-11-12 DIAGNOSIS — E038 Other specified hypothyroidism: Secondary | ICD-10-CM | POA: Diagnosis not present

## 2017-11-12 DIAGNOSIS — Z125 Encounter for screening for malignant neoplasm of prostate: Secondary | ICD-10-CM | POA: Diagnosis not present

## 2017-11-12 DIAGNOSIS — R82998 Other abnormal findings in urine: Secondary | ICD-10-CM | POA: Diagnosis not present

## 2017-11-12 DIAGNOSIS — E538 Deficiency of other specified B group vitamins: Secondary | ICD-10-CM | POA: Diagnosis not present

## 2017-11-12 DIAGNOSIS — R7301 Impaired fasting glucose: Secondary | ICD-10-CM | POA: Diagnosis not present

## 2017-11-13 DIAGNOSIS — R7301 Impaired fasting glucose: Secondary | ICD-10-CM | POA: Diagnosis not present

## 2017-11-19 DIAGNOSIS — E538 Deficiency of other specified B group vitamins: Secondary | ICD-10-CM | POA: Diagnosis not present

## 2017-11-19 DIAGNOSIS — Z1389 Encounter for screening for other disorder: Secondary | ICD-10-CM | POA: Diagnosis not present

## 2017-11-19 DIAGNOSIS — I2510 Atherosclerotic heart disease of native coronary artery without angina pectoris: Secondary | ICD-10-CM | POA: Diagnosis not present

## 2017-11-19 DIAGNOSIS — I208 Other forms of angina pectoris: Secondary | ICD-10-CM | POA: Diagnosis not present

## 2017-11-19 DIAGNOSIS — Z6833 Body mass index (BMI) 33.0-33.9, adult: Secondary | ICD-10-CM | POA: Diagnosis not present

## 2017-11-19 DIAGNOSIS — M25562 Pain in left knee: Secondary | ICD-10-CM | POA: Diagnosis not present

## 2017-11-19 DIAGNOSIS — K900 Celiac disease: Secondary | ICD-10-CM | POA: Diagnosis not present

## 2017-11-19 DIAGNOSIS — Z0000 Encounter for general adult medical examination without abnormal findings: Secondary | ICD-10-CM | POA: Diagnosis not present

## 2017-11-19 DIAGNOSIS — H6982 Other specified disorders of Eustachian tube, left ear: Secondary | ICD-10-CM | POA: Diagnosis not present

## 2017-11-19 DIAGNOSIS — E668 Other obesity: Secondary | ICD-10-CM | POA: Diagnosis not present

## 2017-11-19 DIAGNOSIS — R0683 Snoring: Secondary | ICD-10-CM | POA: Diagnosis not present

## 2017-11-19 DIAGNOSIS — M545 Low back pain: Secondary | ICD-10-CM | POA: Diagnosis not present

## 2017-11-23 DIAGNOSIS — L218 Other seborrheic dermatitis: Secondary | ICD-10-CM | POA: Diagnosis not present

## 2017-11-23 DIAGNOSIS — L821 Other seborrheic keratosis: Secondary | ICD-10-CM | POA: Diagnosis not present

## 2017-11-23 DIAGNOSIS — D225 Melanocytic nevi of trunk: Secondary | ICD-10-CM | POA: Diagnosis not present

## 2017-11-23 DIAGNOSIS — D1801 Hemangioma of skin and subcutaneous tissue: Secondary | ICD-10-CM | POA: Diagnosis not present

## 2017-11-23 DIAGNOSIS — L853 Xerosis cutis: Secondary | ICD-10-CM | POA: Diagnosis not present

## 2017-11-25 DIAGNOSIS — Z1212 Encounter for screening for malignant neoplasm of rectum: Secondary | ICD-10-CM | POA: Diagnosis not present

## 2018-01-14 DIAGNOSIS — E538 Deficiency of other specified B group vitamins: Secondary | ICD-10-CM | POA: Diagnosis not present

## 2018-01-27 DIAGNOSIS — M67912 Unspecified disorder of synovium and tendon, left shoulder: Secondary | ICD-10-CM | POA: Diagnosis not present

## 2018-01-27 DIAGNOSIS — M67911 Unspecified disorder of synovium and tendon, right shoulder: Secondary | ICD-10-CM | POA: Diagnosis not present

## 2018-02-02 DIAGNOSIS — M7542 Impingement syndrome of left shoulder: Secondary | ICD-10-CM | POA: Diagnosis not present

## 2018-02-02 DIAGNOSIS — M7541 Impingement syndrome of right shoulder: Secondary | ICD-10-CM | POA: Diagnosis not present

## 2018-02-24 DIAGNOSIS — M7541 Impingement syndrome of right shoulder: Secondary | ICD-10-CM | POA: Diagnosis not present

## 2018-02-25 DIAGNOSIS — E538 Deficiency of other specified B group vitamins: Secondary | ICD-10-CM | POA: Diagnosis not present

## 2018-03-05 IMAGING — MR MR KNEE*L* W/O CM
4 of 5 series · 19 of 40 positions shown · non-contrast
Comparison: None.

CLINICAL DATA: Onset of left knee pain while walking on 12/11/2016.
Left knee weakness.

EXAM:
MRI OF THE LEFT KNEE WITHOUT CONTRAST
TECHNIQUE: Multiplanar, multisequence MR imaging of the knee was performed. No
intravenous contrast was administered.

[Series 3: PD fat-sat · axial · 3.5mm · 0.31mm/px · z∈[-70,+47]mm · 8 of 29 slices shown (1 of 3)]
[im 1/29]
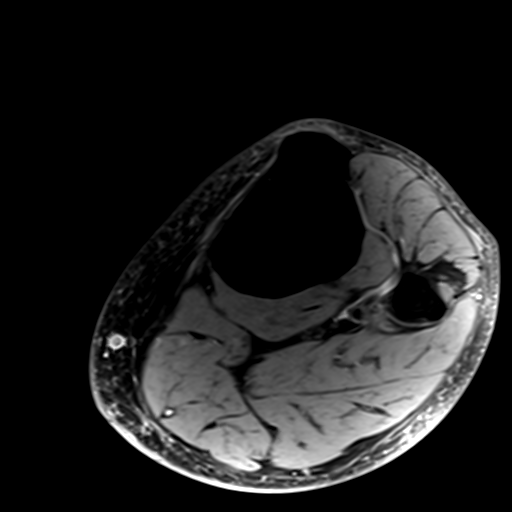
[im 5/29]
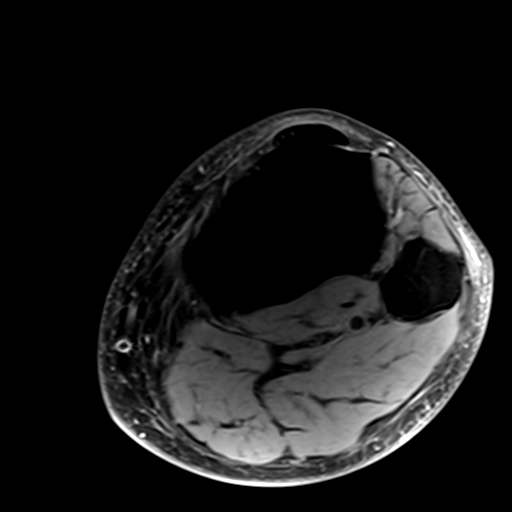
[im 9/29]
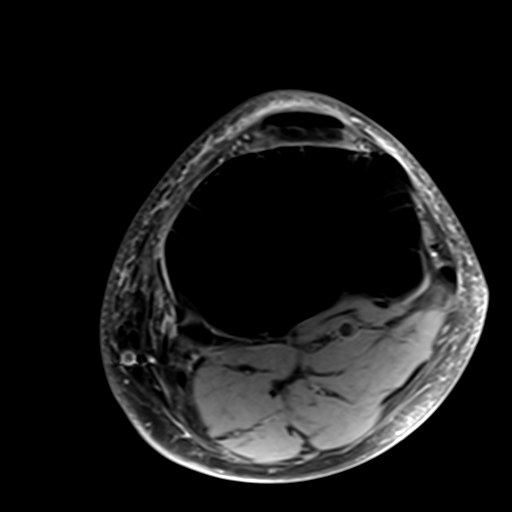
[im 13/29]
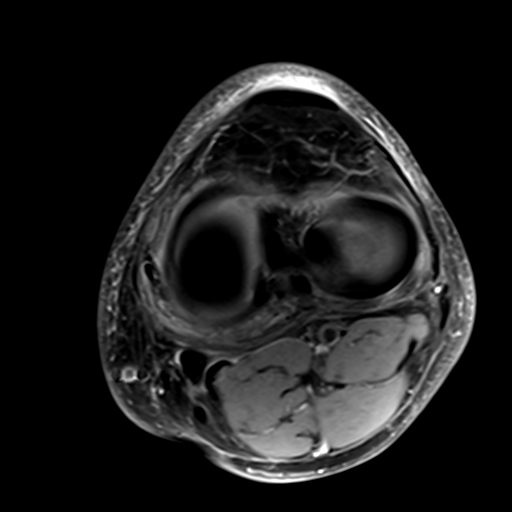
[im 17/29]
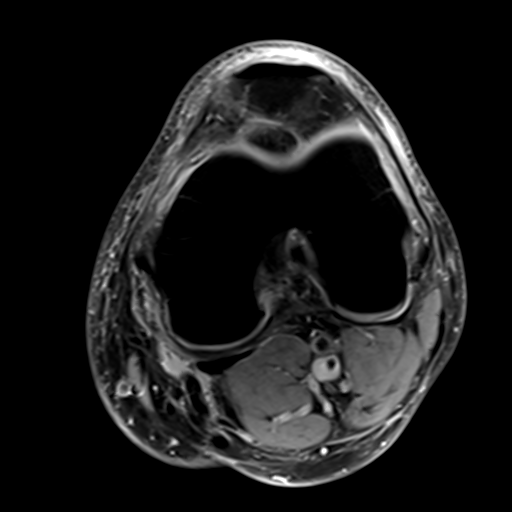
[im 21/29]
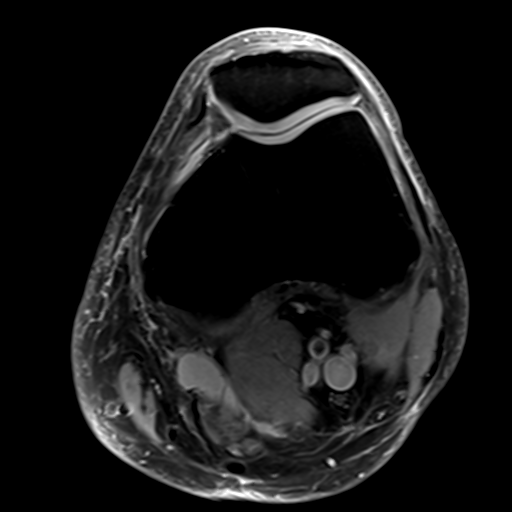
[im 25/29]
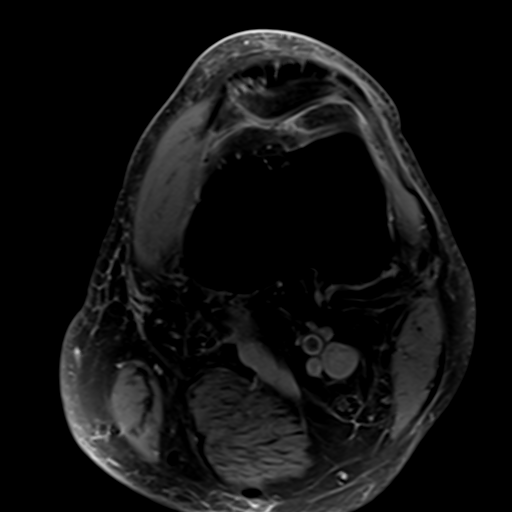
[im 29/29]
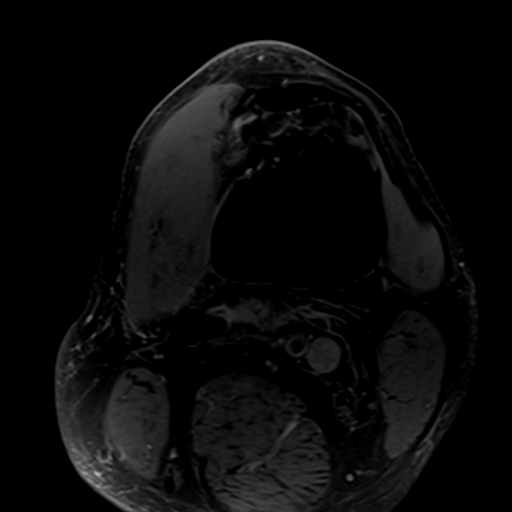

[Series 4: PD fat-sat · coronal · 3.5mm · 0.29mm/px · 5 of 24 slices shown (2 of 3)]
[im 1/24]
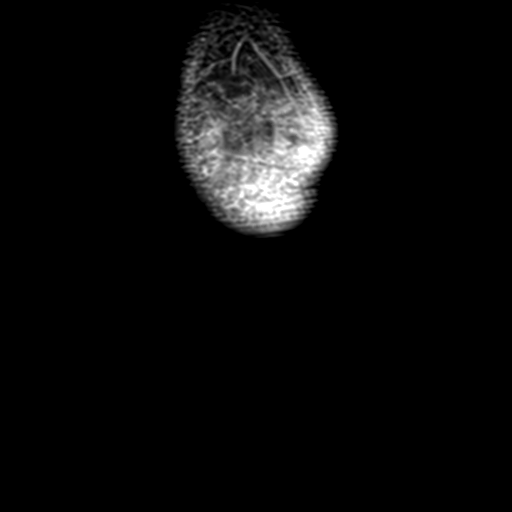
[im 4/24]
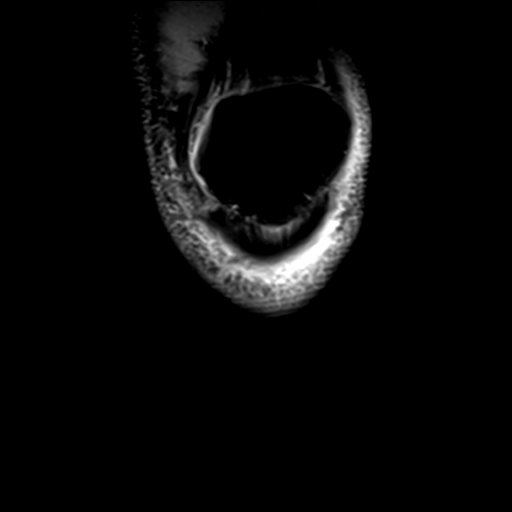
[im 7/24]
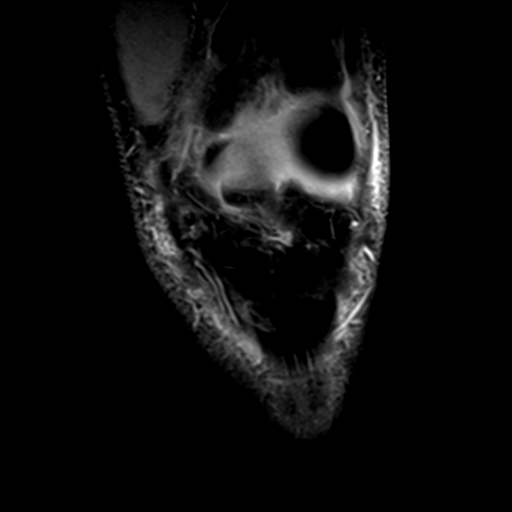
[im 14/24]
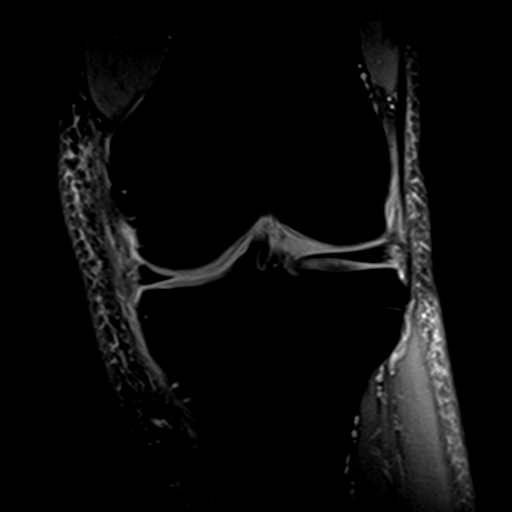
[im 20/24]
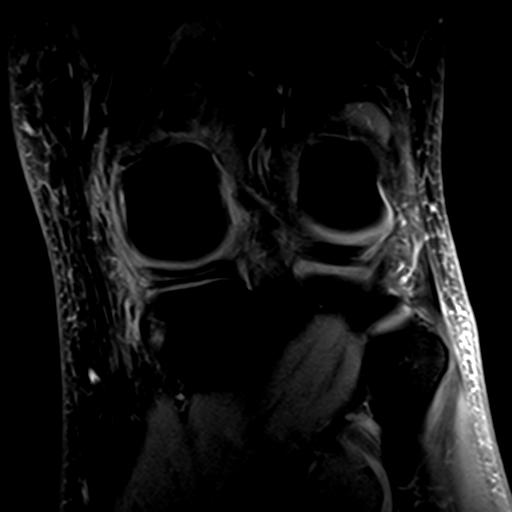

[Series 5: T2 fat-sat · coronal · 3.5mm · 0.29mm/px · 3 of 24 slices shown]
[im 4/24]
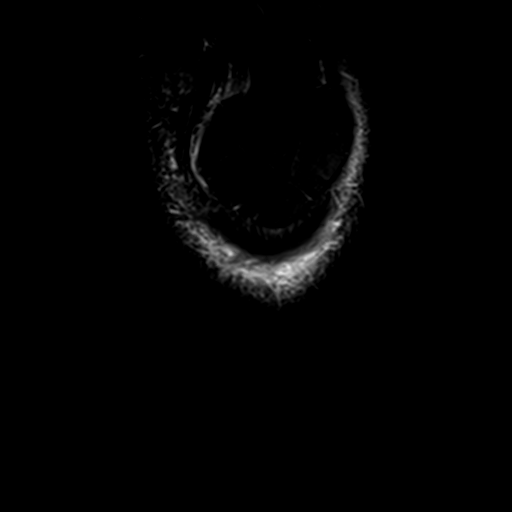
[im 14/24]
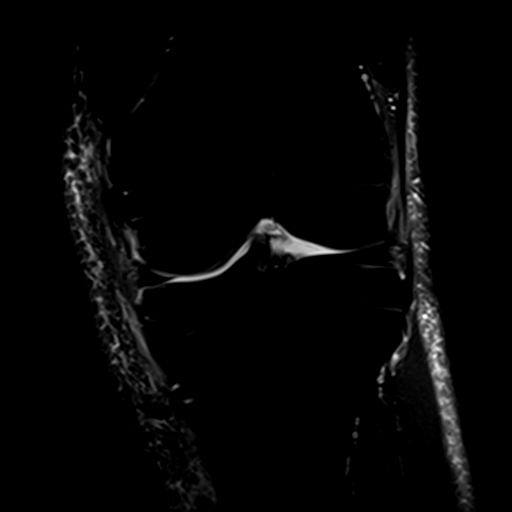
[im 20/24]
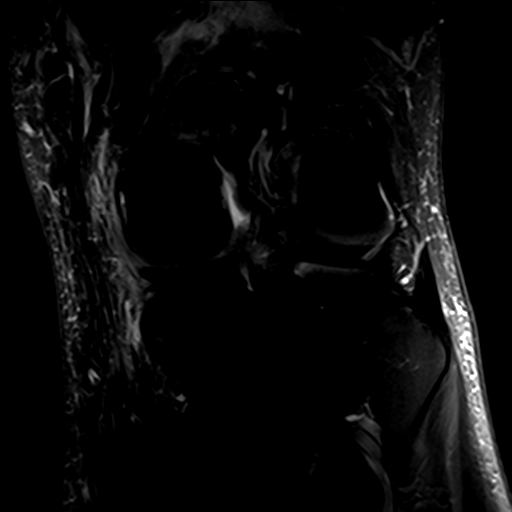

[Series 7: PD fat-sat · sagittal · 3.2mm · 0.29mm/px · 3 of 26 slices shown (3 of 3)]
[im 4/26]
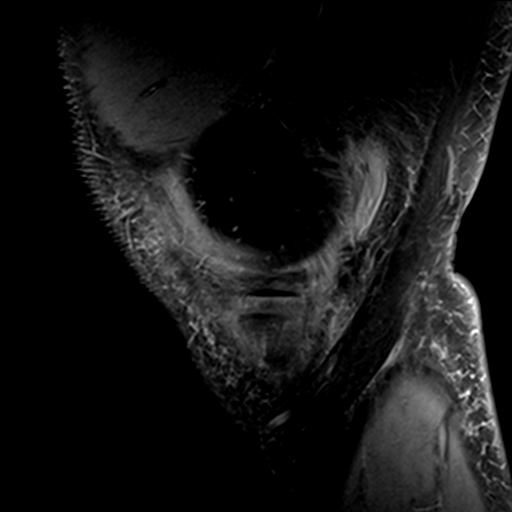
[im 15/26]
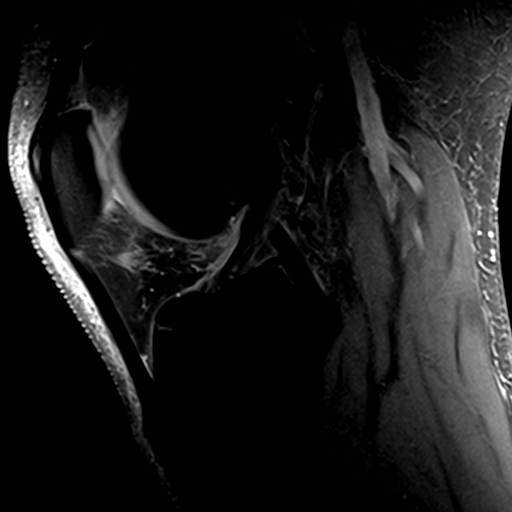
[im 22/26]
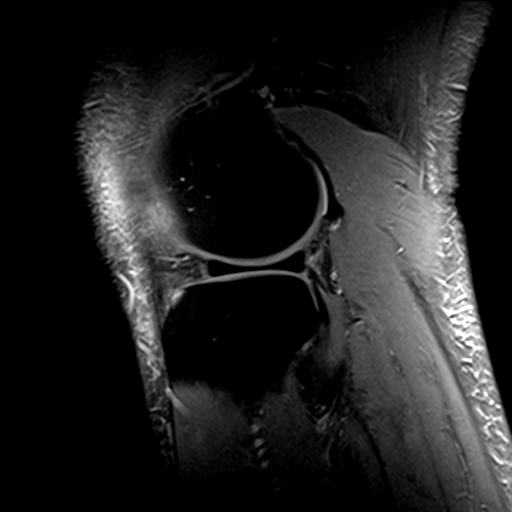

[19 of 40 positions shown; findings below may reference images not displayed]

FINDINGS: MENISCI

Medial meniscus: Large horizontal tear in the posterior horn reaches
the femoral articular surface and extends into the mid meniscal
body. No centrally displaced fragment.

Lateral meniscus:  Intact.

LIGAMENTS

Cruciates:  Intact.

Collaterals:  Intact.

CARTILAGE

Patellofemoral:  Normal.

Medial:  Minimally degenerated.

Lateral:  Normal.

Joint:  No effusion.

Popliteal Fossa: Baker's cyst measures approximately 1.2 cm AP x
cm transverse x 3.6 cm craniocaudal.

Extensor Mechanism:  Intact.

Bones: No fracture or worrisome lesion. Tiny T1 and T2 hypointense
lesion in the posterolateral femoral condyle is consistent with a
bone island.

Other: None.
IMPRESSION: Large horizontal tear posterior horn and posterior body of the
medial meniscus reaches the femoral articular surface. No displaced
fragment.

Small Baker's cyst.

## 2018-03-12 DIAGNOSIS — M25511 Pain in right shoulder: Secondary | ICD-10-CM | POA: Diagnosis not present

## 2018-03-12 DIAGNOSIS — M25512 Pain in left shoulder: Secondary | ICD-10-CM | POA: Diagnosis not present

## 2018-03-17 DIAGNOSIS — M75112 Incomplete rotator cuff tear or rupture of left shoulder, not specified as traumatic: Secondary | ICD-10-CM | POA: Diagnosis not present

## 2018-03-17 DIAGNOSIS — M75111 Incomplete rotator cuff tear or rupture of right shoulder, not specified as traumatic: Secondary | ICD-10-CM | POA: Diagnosis not present

## 2018-03-24 DIAGNOSIS — M25512 Pain in left shoulder: Secondary | ICD-10-CM | POA: Diagnosis not present

## 2018-03-24 DIAGNOSIS — M25511 Pain in right shoulder: Secondary | ICD-10-CM | POA: Diagnosis not present

## 2018-04-02 DIAGNOSIS — M25512 Pain in left shoulder: Secondary | ICD-10-CM | POA: Diagnosis not present

## 2018-04-02 DIAGNOSIS — M25511 Pain in right shoulder: Secondary | ICD-10-CM | POA: Diagnosis not present

## 2018-04-06 DIAGNOSIS — M25511 Pain in right shoulder: Secondary | ICD-10-CM | POA: Diagnosis not present

## 2018-04-06 DIAGNOSIS — M25512 Pain in left shoulder: Secondary | ICD-10-CM | POA: Diagnosis not present

## 2018-04-08 DIAGNOSIS — M25511 Pain in right shoulder: Secondary | ICD-10-CM | POA: Diagnosis not present

## 2018-04-08 DIAGNOSIS — M25512 Pain in left shoulder: Secondary | ICD-10-CM | POA: Diagnosis not present

## 2018-04-13 DIAGNOSIS — M25511 Pain in right shoulder: Secondary | ICD-10-CM | POA: Diagnosis not present

## 2018-04-13 DIAGNOSIS — M25512 Pain in left shoulder: Secondary | ICD-10-CM | POA: Diagnosis not present

## 2018-04-15 ENCOUNTER — Telehealth: Payer: Self-pay

## 2018-04-15 DIAGNOSIS — E538 Deficiency of other specified B group vitamins: Secondary | ICD-10-CM | POA: Diagnosis not present

## 2018-04-15 DIAGNOSIS — E291 Testicular hypofunction: Secondary | ICD-10-CM | POA: Diagnosis not present

## 2018-04-15 NOTE — Telephone Encounter (Signed)
   [facility] Pre-operative Risk Assessment    Request for surgical clearance:  1. What type of surgery is being performed? Right Shoulder Arthroscopic Rotator Cuff Repair, Subacromial decompression   2. When is this surgery scheduled?  05/04/18   3. What type of clearance is required (medical clearance vs. Pharmacy clearance to hold med vs. Both)? Both  4. Are there any medications that need to be held prior to surgery and how long? ASA   5. Practice name and name of physician performing surgery? [facility]   6. What is your office phone number [place]   7.   What is your office fax number [phone]  8.   Anesthesia type (None, local, MAC, general) ? Choice   [name] 04/15/2018, 10:47 AM  _________________________________________________________________   (provider comments below)

## 2018-04-16 ENCOUNTER — Telehealth: Payer: Self-pay | Admitting: Cardiology

## 2018-04-16 ENCOUNTER — Telehealth: Payer: Self-pay

## 2018-04-16 ENCOUNTER — Encounter: Payer: Self-pay | Admitting: Cardiology

## 2018-04-16 NOTE — Telephone Encounter (Signed)
LMTCB to discuss appointment

## 2018-04-16 NOTE — Telephone Encounter (Signed)
Left message for patient on home and mobile number to contact office to appointment.

## 2018-04-16 NOTE — Telephone Encounter (Signed)
Pt would like his surgical clearance appt before 8/13 day of his surgery , if possible I offered 8/14 -8/15

## 2018-04-16 NOTE — Telephone Encounter (Signed)
   Primary Cardiologist: Dr Martinique  Chart reviewed as part of pre-operative protocol coverage. Because of Dierre Crevier Tassin's past medical history and time since last visit, he/she will require a follow-up visit in order to better assess preoperative cardiovascular risk.  Pre-op covering staff: - Please schedule appointment and call patient to inform them. - Please contact requesting surgeon's office via preferred method (i.e, phone, fax) to inform them of need for appointment prior to surgery.  Kerin Ransom, PA-C  04/16/2018, 11:30 AM

## 2018-04-16 NOTE — Telephone Encounter (Signed)
Error

## 2018-04-19 NOTE — Telephone Encounter (Signed)
Left message to call the office to schedule appt for surgery clearance.

## 2018-04-20 NOTE — Telephone Encounter (Signed)
Pt scheduled for surgical clearance, 04/28/18, with Doreene Adas, PA-C

## 2018-04-21 NOTE — Telephone Encounter (Signed)
Patient has PAOV 04/28/18

## 2018-04-27 NOTE — Progress Notes (Signed)
Cardiology Office Note:    Date:  04/28/2018   ID:  [name], DOB 08/03/1951, MRN [id number]  PCP:  Crist Infante, MD  Cardiologist:  Peter Martinique, MD   Referring MD: Crist Infante, MD   Chief Complaint  Patient presents with  . Medical Clearance    shoulder surgery    History of Present Illness:    [name] is a 67 y.o. male with a hx of hypertension, CAD, OSA, lipidemia, and obesity.  He was last seen in 08/20/2017 by Dr. Martinique.  Prior to that his last visit was 2016.  A history of a stent to his mid LAD and mid RCA in 2004 with DES.  Myoview in 2011 was low risk.  Repeat heart cath in 2011 showed nonobstructive disease and patency of stents.  Repeat Myoview in 2016 was low risk.  Presents today for surgical clearance for right shoulder surgery and advice on holding aspirin. He denies anginal symptoms but states he is experiencing a little more shortness of breath when he push mows his yard.  He also states that he has had some lower extremity swelling over the past 3 months that is new for him.  He wears compression socks when he wears long pants during the summer.  He admits to eating sodium.  I appreciate a new murmur on exam.  Overall he is doing quite well but I am concerned about this minimal dyspnea on exertion and lower extremity swelling that are new.   Past Medical History:  Diagnosis Date  . Benign paroxysmal positional vertigo   . Coronary artery disease 2004   with prior stenting of the mid LAD and mid right coronary  / cardiac cath with satifsfactory finding following abnormal stress test  . Depressive disorder, not elsewhere classified   . Gastritis   . Gluten enteropathy   . Heart palpitations    went to ER on 04/27/2006 for palpitations  . Hypercholesteremia   . Hypertension   . Hypothyroidism   . Iron deficiency anemia, unspecified   . Myalgia and myositis, unspecified   . Personal history of colonic polyps 10/2006   hyperplastic polyp  . SOB  (shortness of breath)     Past Surgical History:  Procedure Laterality Date  . CARDIAC CATHETERIZATION  08/25/2003   EF estimated 65% / stenting of the mid left  anterior descending and mid right coronary artery in 2004 with drug-eluting stent/Two vessel obstructive atherosclerotic coronary artery disease. / Normal left ventricular function. / Successful stenting of the mid right coronary artery and the mid left anterior descending  . CARDIAC CATHETERIZATION  07/17/2010   EF estimated at 65% / Left ventricular angiography was performed in the RAO view  This demonstrates normal left ventricular size and contractility with normal  systolic function / Nonobstructive atherosclerotic coronary disease with continued  excellent patency of the stents in the mid LAD & mid right coronary artery / Normal left ventricular function  . COLONOSCOPY    . OSTEOTOMY     removal of bone spur on (L) arm  . POLYPECTOMY      Current Medications: Current Meds  Medication Sig  . amLODipine (NORVASC) 5 MG tablet Take 1 tablet by mouth daily.  [name] aspirin 81 MG tablet Take 81 mg by mouth daily.  [name] buPROPion (WELLBUTRIN XL) 150 MG 24 hr tablet Take 150 mg by mouth daily.   [name] co-enzyme Q-10 30 MG capsule Take 30 mg by mouth 3 (three) times daily.  [name]  cyanocobalamin (,VITAMIN B-12,) 1000 MCG/ML injection Inject 1,000 mcg into the muscle every 30 (thirty) days.    [name] ezetimibe (ZETIA) 10 MG tablet Take 10 mg by mouth daily.    [name] ketoconazole (NIZORAL) 2 % shampoo as directed.  [name] levothyroxine (SYNTHROID, LEVOTHROID) 200 MCG tablet Take 200-300 mcg by mouth daily.   . ramipril (ALTACE) 10 MG capsule Take 10 mg by mouth daily.  . sildenafil (REVATIO) 20 MG tablet Take 20 mg by mouth daily as needed.  . simvastatin (ZOCOR) 20 MG tablet Take 20 mg by mouth every evening.  . TESTOSTERONE TD Inject 2 mLs as directed as directed.      Allergies:   Patient has no known allergies.   Social History   Socioeconomic History    . Marital status: Married    Spouse name: Not on file  . Number of children: 2  . Years of education: Not on file  . Highest education level: Not on file  Occupational History  . Occupation: Health visitor: [employer]  . Financial resource strain: Not on file  . Food insecurity:    Worry: Not on file    Inability: Not on file  . Transportation needs:    Medical: Not on file    Non-medical: Not on file  Tobacco Use  . Smoking status: Former Smoker    Packs/day: 1.00    Types: Cigarettes    Last attempt to quit: 09/22/2000    Years since quitting: 17.6  . Smokeless tobacco: Never Used  Substance and Sexual Activity  . Alcohol use: Yes    Alcohol/week: 4.2 oz    Types: 7 Glasses of wine per week  . Drug use: No  . Sexual activity: Not on file  Lifestyle  . Physical activity:    Days per week: Not on file    Minutes per session: Not on file  . Stress: Not on file  Relationships  . Social connections:    Talks on phone: Not on file    Gets together: Not on file    Attends religious service: Not on file    Active member of club or organization: Not on file    Attends meetings of clubs or organizations: Not on file    Relationship status: Not on file  Other Topics Concern  . Not on file  Social History Narrative  . Not on file     Family History: The patient's family history includes Coronary artery disease in his brother and brother; Diabetes in his brother; Heart attack in his father; Pancreatic cancer in his mother; Prostate cancer in his father and paternal uncle; Stroke in his brother.  ROS:   Please see the history of present illness.    All other systems reviewed and are negative.  EKGs/Labs/Other Studies Reviewed:    The following studies were reviewed today:  Myovew 2016  The left ventricular ejection fraction is normal (55-65%).  Nuclear stress EF: 61%.  Blood pressure demonstrated a hypertensive response to  exercise.  Horizontal ST segment depression ST segment depression of 2 mm was noted during stress in the III, aVF and II leads.  The study is normal.  This is a low risk study.   Low risk stress nuclear study with normal perfusion and normal left ventricular regional and global systolic function. Abnormal ECG response probably represents a "false positive" study, possibly related to hypertensive response to exercise.   EKG:  EKG is  ordered today.  The ekg ordered today demonstrates sinus rhythm.  Recent Labs: No results found for requested labs within last 8760 hours.  Recent Lipid Panel No results found for: CHOL, TRIG, HDL, CHOLHDL, VLDL, LDLCALC, LDLDIRECT  Physical Exam:    VS:  BP 136/82   Pulse 65   Ht 5\' 9"  (1.753 m)   Wt 235 lb (106.6 kg)   BMI 34.70 kg/m     Wt Readings from Last 3 Encounters:  04/28/18 235 lb (106.6 kg)  08/20/17 229 lb 12.8 oz (104.2 kg)  12/29/16 230 lb (104.3 kg)     GEN: Well nourished, well developed in no acute distress HEENT: Normal NECK: No JVD; No carotid bruits LYMPHATICS: No lymphadenopathy CARDIAC: RRR, 2/6 murmur, no rubs or gallops RESPIRATORY:  Clear to auscultation without rales, wheezing or rhonchi  ABDOMEN: Soft, non-tender, non-distended MUSCULOSKELETAL:  + B LE edema; No deformity  SKIN: Warm and dry NEUROLOGIC:  Alert and oriented x 3 PSYCHIATRIC:  Normal affect   ASSESSMENT:    1. Pre-operative cardiovascular examination   2. Coronary artery disease involving native coronary artery of native heart without angina pectoris   3. Essential hypertension   4. Hypercholesteremia   5. OSA (obstructive sleep apnea)   6. Dyspnea on exertion   7. Swelling of lower extremity    PLAN:    In order of problems listed above:  Dyspnea on exertion Lower extremity swelling I appreciate a new murmur on exam today.  He also states that he is having some very mild dyspnea on exertion when he push mows his yard.  He also states  he has been having lower extremity swelling over the past 3 months.  Extremity swelling is improved when he wears compression socks.  He admits to dietary indiscretion in terms of sodium.  We discussed his salt intake.  We will obtain an echocardiogram for structure and function.  Coronary artery disease involving native coronary artery of native heart without angina pectoris  status post DES to mid LAD and RCA, 2004; heart cath 2011 with nonobstructive disease and patent stents, low risk Myoview in 2016 Patient denies anginal symptoms including chest pain on exertion, left arm pain, back pain.  No further ischemic evaluation at this time.  Continue aspirin 81 mg.   Essential hypertension Pressure is well controlled on 5 mg Norvasc 10 mg ramipril  Hypercholesteremia Continue statin.  Cholesterol is followed by his PCP. 11/12/2016: Total cholesterol: 131, HDL: 30, LDL: 77, triglycerides: 121  OSA (obstructive sleep apnea) He was recently diagnosed with "borderline" sleep apnea.  He was given the option of trying a CPAP.  He is holding off on CPAP for now.  We discussed losing 10 pounds.  Pre-operative cardiovascular examination The patient is stable from a coronary artery disease perspective and requires no further ischemic evaluation prior to his shoulder surgery.  However I have concerned about his new mild dyspnea on exertion and lower extremity swelling.  I will obtain an echocardiogram.  On those echo results, I will send a letter for clearance to the requesting surgeon.  He will have the echocardiogram completed this afternoon.  He may hold aspirin 7 to 10 days prior to surgery.  Since his surgery is next Tuesday, I asked him to hold his aspirin starting tomorrow.  Took aspirin this morning.   Follow-up in 1 year.   Medication Adjustments/Labs and Tests Ordered: Current medicines are reviewed at length with the patient today.  Concerns regarding medicines are  outlined above.  Orders  Placed This Encounter  Procedures  . EKG 12-Lead  . ECHOCARDIOGRAM COMPLETE   No orders of the defined types were placed in this encounter.   Signed, Ledora Bottcher, [place]  04/28/2018 8:51 AM    [facility]

## 2018-04-28 ENCOUNTER — Ambulatory Visit (INDEPENDENT_AMBULATORY_CARE_PROVIDER_SITE_OTHER): Payer: Medicare Other | Admitting: Physician Assistant

## 2018-04-28 ENCOUNTER — Other Ambulatory Visit: Payer: Self-pay

## 2018-04-28 ENCOUNTER — Telehealth: Payer: Self-pay | Admitting: Physician Assistant

## 2018-04-28 ENCOUNTER — Ambulatory Visit (HOSPITAL_COMMUNITY): Payer: Medicare Other | Attending: Physician Assistant

## 2018-04-28 ENCOUNTER — Encounter: Payer: Self-pay | Admitting: Physician Assistant

## 2018-04-28 VITALS — BP 136/82 | HR 65 | Ht 69.0 in | Wt 235.0 lb

## 2018-04-28 DIAGNOSIS — I2510 Atherosclerotic heart disease of native coronary artery without angina pectoris: Secondary | ICD-10-CM

## 2018-04-28 DIAGNOSIS — D649 Anemia, unspecified: Secondary | ICD-10-CM | POA: Insufficient documentation

## 2018-04-28 DIAGNOSIS — R0609 Other forms of dyspnea: Secondary | ICD-10-CM | POA: Diagnosis not present

## 2018-04-28 DIAGNOSIS — I059 Rheumatic mitral valve disease, unspecified: Secondary | ICD-10-CM | POA: Diagnosis not present

## 2018-04-28 DIAGNOSIS — E785 Hyperlipidemia, unspecified: Secondary | ICD-10-CM | POA: Diagnosis not present

## 2018-04-28 DIAGNOSIS — E7800 Pure hypercholesterolemia, unspecified: Secondary | ICD-10-CM

## 2018-04-28 DIAGNOSIS — Z01810 Encounter for preprocedural cardiovascular examination: Secondary | ICD-10-CM | POA: Diagnosis not present

## 2018-04-28 DIAGNOSIS — Z87891 Personal history of nicotine dependence: Secondary | ICD-10-CM | POA: Insufficient documentation

## 2018-04-28 DIAGNOSIS — I10 Essential (primary) hypertension: Secondary | ICD-10-CM

## 2018-04-28 DIAGNOSIS — R0600 Dyspnea, unspecified: Secondary | ICD-10-CM

## 2018-04-28 DIAGNOSIS — G4733 Obstructive sleep apnea (adult) (pediatric): Secondary | ICD-10-CM | POA: Diagnosis not present

## 2018-04-28 DIAGNOSIS — E669 Obesity, unspecified: Secondary | ICD-10-CM | POA: Diagnosis not present

## 2018-04-28 DIAGNOSIS — Z6834 Body mass index (BMI) 34.0-34.9, adult: Secondary | ICD-10-CM | POA: Diagnosis not present

## 2018-04-28 DIAGNOSIS — M7989 Other specified soft tissue disorders: Secondary | ICD-10-CM | POA: Diagnosis not present

## 2018-04-28 LAB — ECHOCARDIOGRAM COMPLETE
Height: 69 in
WEIGHTICAEL: 3760 [oz_av]

## 2018-04-28 NOTE — Patient Instructions (Addendum)
Medication Instructions:  Your physician recommends that you continue on your current medications as directed. Please refer to the Current Medication list given to you today.  Testing/Procedures: Your physician has requested that you have an echocardiogram TODAY. Echocardiography is a painless test that uses sound waves to create images of your heart. It provides your doctor with information about the size and shape of your heart and how well your heart's chambers and valves are working. This procedure takes approximately one hour. There are no restrictions for this procedure.  This will be done at our [facility] location:  [place]: Your physician wants you to follow-up in: 1 year with Dr. Martinique. You will receive a reminder letter in the mail two months in advance. If you don't receive a letter, please call our office to schedule the follow-up appointment.   Any Other Special Instructions Will Be Listed Below (If Applicable).     If you need a refill on your cardiac medications before your next appointment, please call your pharmacy.

## 2018-04-28 NOTE — Telephone Encounter (Signed)
   Primary Cardiologist: Peter Martinique, MD  Chart reviewed as part of pre-operative protocol coverage. Patient was contacted 04/28/2018 in reference to pre-operative risk assessment for pending surgery as outlined below.  [name] was last seen on 04/28/18 by Fabian Sharp PAC.  He is doing well without anginal symptoms, but described dyspnea on exertion and new lower extremity swelling. Echocardiogram completed today showed normal LVEF but grade 1 DD, which may be contributing to his symptoms. I have asked him to complete a trial of low dose lasix to see if this improves symptoms. He states he can breathe when supine. He does not need further ischemic evaluation.  Therefore, based on ACC/AHA guidelines, the patient would be at acceptable risk for the planned procedure without further cardiovascular testing. I recommend caution with extra IV fluids in the post-operative period, if possible.   I will route this recommendation to the requesting party via Epic fax function and remove from pre-op pool.  Please call with questions.  Tami Lin Duke, PA 04/28/2018, 5:11 PM

## 2018-05-04 DIAGNOSIS — M7541 Impingement syndrome of right shoulder: Secondary | ICD-10-CM | POA: Diagnosis not present

## 2018-05-04 DIAGNOSIS — M75111 Incomplete rotator cuff tear or rupture of right shoulder, not specified as traumatic: Secondary | ICD-10-CM | POA: Diagnosis not present

## 2018-05-04 DIAGNOSIS — G8918 Other acute postprocedural pain: Secondary | ICD-10-CM | POA: Diagnosis not present

## 2018-05-04 DIAGNOSIS — M24111 Other articular cartilage disorders, right shoulder: Secondary | ICD-10-CM | POA: Diagnosis not present

## 2018-05-12 DIAGNOSIS — Z9889 Other specified postprocedural states: Secondary | ICD-10-CM | POA: Diagnosis not present

## 2018-05-13 DIAGNOSIS — M75111 Incomplete rotator cuff tear or rupture of right shoulder, not specified as traumatic: Secondary | ICD-10-CM | POA: Diagnosis not present

## 2018-05-13 DIAGNOSIS — M25511 Pain in right shoulder: Secondary | ICD-10-CM | POA: Diagnosis not present

## 2018-05-14 DIAGNOSIS — E038 Other specified hypothyroidism: Secondary | ICD-10-CM | POA: Diagnosis not present

## 2018-05-14 DIAGNOSIS — Z23 Encounter for immunization: Secondary | ICD-10-CM | POA: Diagnosis not present

## 2018-05-14 DIAGNOSIS — E538 Deficiency of other specified B group vitamins: Secondary | ICD-10-CM | POA: Diagnosis not present

## 2018-05-14 DIAGNOSIS — Z6833 Body mass index (BMI) 33.0-33.9, adult: Secondary | ICD-10-CM | POA: Diagnosis not present

## 2018-05-14 DIAGNOSIS — R7301 Impaired fasting glucose: Secondary | ICD-10-CM | POA: Diagnosis not present

## 2018-05-14 DIAGNOSIS — I10 Essential (primary) hypertension: Secondary | ICD-10-CM | POA: Diagnosis not present

## 2018-05-14 DIAGNOSIS — I2510 Atherosclerotic heart disease of native coronary artery without angina pectoris: Secondary | ICD-10-CM | POA: Diagnosis not present

## 2018-05-14 DIAGNOSIS — G4709 Other insomnia: Secondary | ICD-10-CM | POA: Diagnosis not present

## 2018-05-14 DIAGNOSIS — G4700 Insomnia, unspecified: Secondary | ICD-10-CM | POA: Insufficient documentation

## 2018-05-17 ENCOUNTER — Other Ambulatory Visit: Payer: Self-pay

## 2018-05-17 MED ORDER — FUROSEMIDE 20 MG PO TABS: ORAL_TABLET | ORAL | 0 refills | Status: DC

## 2018-05-18 DIAGNOSIS — M75111 Incomplete rotator cuff tear or rupture of right shoulder, not specified as traumatic: Secondary | ICD-10-CM | POA: Diagnosis not present

## 2018-05-18 DIAGNOSIS — M25511 Pain in right shoulder: Secondary | ICD-10-CM | POA: Diagnosis not present

## 2018-05-20 DIAGNOSIS — M75111 Incomplete rotator cuff tear or rupture of right shoulder, not specified as traumatic: Secondary | ICD-10-CM | POA: Diagnosis not present

## 2018-05-20 DIAGNOSIS — E291 Testicular hypofunction: Secondary | ICD-10-CM | POA: Diagnosis not present

## 2018-05-20 DIAGNOSIS — M25511 Pain in right shoulder: Secondary | ICD-10-CM | POA: Diagnosis not present

## 2018-05-25 DIAGNOSIS — M75111 Incomplete rotator cuff tear or rupture of right shoulder, not specified as traumatic: Secondary | ICD-10-CM | POA: Diagnosis not present

## 2018-05-25 DIAGNOSIS — M25511 Pain in right shoulder: Secondary | ICD-10-CM | POA: Diagnosis not present

## 2018-05-27 DIAGNOSIS — M25511 Pain in right shoulder: Secondary | ICD-10-CM | POA: Diagnosis not present

## 2018-05-27 DIAGNOSIS — M75111 Incomplete rotator cuff tear or rupture of right shoulder, not specified as traumatic: Secondary | ICD-10-CM | POA: Diagnosis not present

## 2018-06-01 DIAGNOSIS — M75111 Incomplete rotator cuff tear or rupture of right shoulder, not specified as traumatic: Secondary | ICD-10-CM | POA: Diagnosis not present

## 2018-06-01 DIAGNOSIS — M25511 Pain in right shoulder: Secondary | ICD-10-CM | POA: Diagnosis not present

## 2018-06-03 DIAGNOSIS — M75111 Incomplete rotator cuff tear or rupture of right shoulder, not specified as traumatic: Secondary | ICD-10-CM | POA: Diagnosis not present

## 2018-06-03 DIAGNOSIS — M25511 Pain in right shoulder: Secondary | ICD-10-CM | POA: Diagnosis not present

## 2018-06-08 DIAGNOSIS — M75111 Incomplete rotator cuff tear or rupture of right shoulder, not specified as traumatic: Secondary | ICD-10-CM | POA: Diagnosis not present

## 2018-06-08 DIAGNOSIS — M25511 Pain in right shoulder: Secondary | ICD-10-CM | POA: Diagnosis not present

## 2018-06-10 DIAGNOSIS — M75111 Incomplete rotator cuff tear or rupture of right shoulder, not specified as traumatic: Secondary | ICD-10-CM | POA: Diagnosis not present

## 2018-06-10 DIAGNOSIS — M25511 Pain in right shoulder: Secondary | ICD-10-CM | POA: Diagnosis not present

## 2018-06-14 DIAGNOSIS — Z9889 Other specified postprocedural states: Secondary | ICD-10-CM | POA: Diagnosis not present

## 2018-06-29 DIAGNOSIS — M75111 Incomplete rotator cuff tear or rupture of right shoulder, not specified as traumatic: Secondary | ICD-10-CM | POA: Diagnosis not present

## 2018-06-29 DIAGNOSIS — M25511 Pain in right shoulder: Secondary | ICD-10-CM | POA: Diagnosis not present

## 2018-07-01 DIAGNOSIS — M75111 Incomplete rotator cuff tear or rupture of right shoulder, not specified as traumatic: Secondary | ICD-10-CM | POA: Diagnosis not present

## 2018-07-01 DIAGNOSIS — M25511 Pain in right shoulder: Secondary | ICD-10-CM | POA: Diagnosis not present

## 2018-07-06 DIAGNOSIS — E291 Testicular hypofunction: Secondary | ICD-10-CM | POA: Diagnosis not present

## 2018-07-06 DIAGNOSIS — M75111 Incomplete rotator cuff tear or rupture of right shoulder, not specified as traumatic: Secondary | ICD-10-CM | POA: Diagnosis not present

## 2018-07-06 DIAGNOSIS — E538 Deficiency of other specified B group vitamins: Secondary | ICD-10-CM | POA: Diagnosis not present

## 2018-07-06 DIAGNOSIS — M25511 Pain in right shoulder: Secondary | ICD-10-CM | POA: Diagnosis not present

## 2018-07-08 DIAGNOSIS — M75111 Incomplete rotator cuff tear or rupture of right shoulder, not specified as traumatic: Secondary | ICD-10-CM | POA: Diagnosis not present

## 2018-07-08 DIAGNOSIS — M25511 Pain in right shoulder: Secondary | ICD-10-CM | POA: Diagnosis not present

## 2018-07-12 NOTE — Progress Notes (Signed)
Cardiology Office Note   Date:  07/13/2018   ID:  [name], DOB 1950/09/24, MRN [id number]  PCP:  Crist Infante, MD  Cardiologist:  Dr. Martinique  Chief Complaint  Patient presents with  . Shortness of Breath    on exertion     History of Present Illness: [name] is a 67 y.o. male who presents for ongoing assessment and management of hypertension, CAD with stents to the mid LAD and RCA in 2004. Most recent left heart cath in 2004  revealed non-obstructive disease. He was last seen in the office for surgical clearance bu Fabian Sharp, [place] on 04/28/2018. Other history includes OSA on CPA, HL, and HTN.   An echocardiogram was ordered due to complaints of dyspnea. He was to hold ASA 7-10 days prior to shoulder surgery. Echo completed on 04/28/2018 demonstrated normal LVEF of 60%-65% with Grade I diastolic dysfunction. He was cleared for surgery.   He comes today with worsening DOE. He states that he works out everyday and finds that he is short of breath sooner than usual. He also visited his daughter in [place] last month, did some hiking and noticed his breathing was harder with the exercise.   He has not been using CPAP. He states that he knows he should use it but he has not followed up with his sleep physician for management.   Past Medical History:  Diagnosis Date  . Benign paroxysmal positional vertigo   . Coronary artery disease 2004   with prior stenting of the mid LAD and mid right coronary  / cardiac cath with satifsfactory finding following abnormal stress test  . Depressive disorder, not elsewhere classified   . Gastritis   . Gluten enteropathy   . Heart palpitations    went to ER on 04/27/2006 for palpitations  . Hypercholesteremia   . Hypertension   . Hypothyroidism   . Iron deficiency anemia, unspecified   . Myalgia and myositis, unspecified   . Personal history of colonic polyps 10/2006   hyperplastic polyp  . SOB (shortness of breath)     Past Surgical History:    Procedure Laterality Date  . CARDIAC CATHETERIZATION  08/25/2003   EF estimated 65% / stenting of the mid left  anterior descending and mid right coronary artery in 2004 with drug-eluting stent/Two vessel obstructive atherosclerotic coronary artery disease. / Normal left ventricular function. / Successful stenting of the mid right coronary artery and the mid left anterior descending  . CARDIAC CATHETERIZATION  07/17/2010   EF estimated at 65% / Left ventricular angiography was performed in the RAO view  This demonstrates normal left ventricular size and contractility with normal  systolic function / Nonobstructive atherosclerotic coronary disease with continued  excellent patency of the stents in the mid LAD & mid right coronary artery / Normal left ventricular function  . COLONOSCOPY    . OSTEOTOMY     removal of bone spur on (L) arm  . POLYPECTOMY       Current Outpatient Medications  Medication Sig Dispense Refill  . amLODipine (NORVASC) 5 MG tablet Take 1 tablet by mouth daily.    [name] aspirin 81 MG tablet Take 81 mg by mouth daily.    [name] buPROPion (WELLBUTRIN XL) 150 MG 24 hr tablet Take 150 mg by mouth daily.     [name] co-enzyme Q-10 30 MG capsule Take 30 mg by mouth 3 (three) times daily.    . cyanocobalamin (,VITAMIN B-12,) 1000 MCG/ML injection Inject 1,000  mcg into the muscle every 30 (thirty) days.      [name] ezetimibe (ZETIA) 10 MG tablet Take 10 mg by mouth daily.      . furosemide (LASIX) 20 MG tablet Take one tablet (20 mg) for three days only. 30 tablet 0  . ketoconazole (NIZORAL) 2 % shampoo as directed.    [name] levothyroxine (SYNTHROID, LEVOTHROID) 200 MCG tablet Take 200-300 mcg by mouth daily.     . ramipril (ALTACE) 10 MG capsule Take 10 mg by mouth daily.    . sildenafil (REVATIO) 20 MG tablet Take 20 mg by mouth daily as needed.    . simvastatin (ZOCOR) 20 MG tablet Take 10 mg by mouth every evening.     . TESTOSTERONE TD Inject 2 mLs as directed as directed.     . nitroGLYCERIN  (NITROSTAT) 0.4 MG SL tablet Place 1 tablet (0.4 mg total) under the tongue every 5 (five) minutes as needed for chest pain. 25 tablet 3   No current facility-administered medications for this visit.     Allergies:   Patient has no known allergies.    Social History:  The patient  reports that he quit smoking about 17 years ago. His smoking use included cigarettes. He smoked 1.00 pack per day. He has never used smokeless tobacco. He reports that he drinks about 7.0 standard drinks of alcohol per week. He reports that he does not use drugs.   Family History:  The patient's family history includes Coronary artery disease in his brother and brother; Diabetes in his brother; Heart attack in his father; Pancreatic cancer in his mother; Prostate cancer in his father and paternal uncle; Stroke in his brother.    ROS: All other systems are reviewed and negative. Unless otherwise mentioned in H&P    PHYSICAL EXAM: VS:  BP (!) 142/72   Pulse 78   Ht 5\' 9"  (1.753 m)   Wt 235 lb (106.6 kg)   SpO2 99%   BMI 34.70 kg/m  , BMI Body mass index is 34.7 kg/m. GEN: Well nourished, well developed, in no acute distress HEENT: normal Neck: no JVD, carotid bruits, or masses Cardiac: RRR; no murmurs, rubs, or gallops,no edema  Respiratory:  Clear to auscultation bilaterally, normal work of breathing GI: soft, nontender, nondistended, + BS. Obese MS: no deformity or atrophy Skin: warm and dry, no rash Neuro:  Strength and sensation are intact Psych: euthymic mood, full affect   EKG:  Not competed this office visit.  Recent Labs: No results found for requested labs within last 8760 hours.    Lipid Panel No results found for: CHOL, TRIG, HDL, CHOLHDL, VLDL, LDLCALC, LDLDIRECT    Wt Readings from Last 3 Encounters:  07/13/18 235 lb (106.6 kg)  04/28/18 235 lb (106.6 kg)  08/20/17 229 lb 12.8 oz (104.2 kg)      Other studies Reviewed: Myovew 2016  The left ventricular ejection fraction is  normal (55-65%).  Nuclear stress EF: 61%.  Blood pressure demonstrated a hypertensive response to exercise.  Horizontal ST segment depression ST segment depression of 2 mm was noted during stress in the III, aVF and II leads.  The study is normal.  This is a low risk study.  Low risk stress nuclear study with normal perfusion and normal left ventricular regional and global systolic function. Abnormal ECG response probably represents a "false positive" study, possibly related to hypertensive response to exercise.  ASSESSMENT AND PLAN:  1. Chest Pressure: He has risk factors  for CAD to include age, hypertension,hypercholesterolemia, and obesity. Will plan NM stress test for evaluation of ischemia causing his symptoms.   2. Dyspnea: Stress test as above. I have also advised that he see his sleep medicine physician for follow up and management. If he is unable to get appointment with her, he can be referred to Dr. Claiborne Billings as a new patient.   3. Hypercholesterolemia: He has had recent labs per PCP. Will request these for our documentation.   4. Hypertension: BP is elevated today. Will follow this. Evaluate his response to the exercise during stress test. May need medication titration.   Current medicines are reviewed at length with the patient today.    Labs/ tests ordered today include: NM Stress test.   Phill Myron. West Pugh, ANP, AACC   07/13/2018 12:14 PM    [facility] [address] Office [phone] Fax [phone]

## 2018-07-13 ENCOUNTER — Ambulatory Visit (INDEPENDENT_AMBULATORY_CARE_PROVIDER_SITE_OTHER): Payer: Medicare Other | Admitting: Adult Health

## 2018-07-13 ENCOUNTER — Encounter: Payer: Self-pay | Admitting: Adult Health

## 2018-07-13 VITALS — BP 142/72 | HR 78 | Ht 69.0 in | Wt 235.0 lb

## 2018-07-13 DIAGNOSIS — R0602 Shortness of breath: Secondary | ICD-10-CM | POA: Diagnosis not present

## 2018-07-13 DIAGNOSIS — E7800 Pure hypercholesterolemia, unspecified: Secondary | ICD-10-CM

## 2018-07-13 DIAGNOSIS — R0789 Other chest pain: Secondary | ICD-10-CM | POA: Diagnosis not present

## 2018-07-13 DIAGNOSIS — I2510 Atherosclerotic heart disease of native coronary artery without angina pectoris: Secondary | ICD-10-CM | POA: Diagnosis not present

## 2018-07-13 DIAGNOSIS — I10 Essential (primary) hypertension: Secondary | ICD-10-CM | POA: Diagnosis not present

## 2018-07-13 MED ORDER — NITROGLYCERIN 0.4 MG SL SUBL: 0.4000 mg | SUBLINGUAL_TABLET | SUBLINGUAL | 3 refills | Status: DC | PRN

## 2018-07-13 NOTE — Patient Instructions (Signed)
Medication Instructions:  NITROGLYCERIN-MAY TAKE EVERY 5 MINUTES X3-DO NOT TAKE SILDENAFIL WITHIN 24 HOUR OF TAKING NITRO  If you need a refill on your cardiac medications before your next appointment, please call your pharmacy.  Labwork: If you have labs (blood work) drawn today and your tests are completely normal, you will receive your results only by: [name] MyChart Message (if you have MyChart) OR . A paper copy in the mail If you have any lab test that is abnormal or we need to change your treatment, we will call you to review the results.  Testing/Procedures: Your physician has requested that you have an Exercise Myoview. A cardiac stress test is a cardiological test that measures the heart's ability to respond to external stress in a controlled clinical environment. The stress response is induced by exercise (exercise-treadmill). For further information please visit [email or web address][email or web address]. If you have questions or concerns about your appointment, you can call the Nuclear Lab at [phone].   Follow-Up: You will need a follow up appointment in 1 MONTH.    You may see  DR Martinique or one of the following Advanced Practice Providers on your designated Care Team:   . Jory Sims, DNP, ANP . -OR- . Almyra Deforest, PA-C . Fabian Sharp, PA-C  At [facility] you and your health needs are our priority.  As part of our continuing mission to provide you with exceptional heart care, we have created designated Provider Care Teams.  These Care Teams include your primary Cardiologist (physician) and Advanced Practice Providers (APPs -  Physician Assistants and Nurse Practitioners) who all work together to provide you with the care you need, when you need it.  Thank you for choosing [facility] at [facility]!!

## 2018-07-14 ENCOUNTER — Telehealth (HOSPITAL_COMMUNITY): Payer: Self-pay

## 2018-07-14 NOTE — Telephone Encounter (Signed)
Encounter complete. 

## 2018-07-15 DIAGNOSIS — M75111 Incomplete rotator cuff tear or rupture of right shoulder, not specified as traumatic: Secondary | ICD-10-CM | POA: Diagnosis not present

## 2018-07-15 DIAGNOSIS — M25511 Pain in right shoulder: Secondary | ICD-10-CM | POA: Diagnosis not present

## 2018-07-16 ENCOUNTER — Ambulatory Visit (HOSPITAL_COMMUNITY)
Admission: RE | Admit: 2018-07-16 | Discharge: 2018-07-16 | Disposition: A | Discharge status: Home / Self Care | Payer: Medicare Other | Source: Ambulatory Visit | Attending: Cardiology | Admitting: Cardiology

## 2018-07-16 DIAGNOSIS — R0789 Other chest pain: Secondary | ICD-10-CM | POA: Diagnosis not present

## 2018-07-16 DIAGNOSIS — R0602 Shortness of breath: Secondary | ICD-10-CM

## 2018-07-16 LAB — MYOCARDIAL PERFUSION IMAGING
CHL CUP NUCLEAR SDS: 3
CHL CUP NUCLEAR SRS: 0
CHL CUP RESTING HR STRESS: 65 {beats}/min
CSEPEDS: 1 s
CSEPHR: 86 %
Estimated workload: 10.1 METS
Exercise duration (min): 8 min
LV sys vol: 50 mL
LVDIAVOL: 123 mL (ref 62–150)
MPHR: 155 {beats}/min
NUC STRESS TID: 1.02
Peak HR: 134 {beats}/min
RPE: 19
SSS: 3

## 2018-07-16 MED ORDER — TECHNETIUM TC 99M TETROFOSMIN IV KIT
30.4000 | PACK | Freq: Once | INTRAVENOUS | Status: AC | PRN
  Administered 2018-07-16: 30.4 via INTRAVENOUS
  Filled 2018-07-16: qty 31

## 2018-07-16 MED ORDER — TECHNETIUM TC 99M TETROFOSMIN IV KIT
10.1000 | PACK | Freq: Once | INTRAVENOUS | Status: AC | PRN
  Administered 2018-07-16: 10.1 via INTRAVENOUS
  Filled 2018-07-16: qty 11

## 2018-07-21 DIAGNOSIS — M75111 Incomplete rotator cuff tear or rupture of right shoulder, not specified as traumatic: Secondary | ICD-10-CM | POA: Diagnosis not present

## 2018-07-21 DIAGNOSIS — M25511 Pain in right shoulder: Secondary | ICD-10-CM | POA: Diagnosis not present

## 2018-07-23 DIAGNOSIS — M75111 Incomplete rotator cuff tear or rupture of right shoulder, not specified as traumatic: Secondary | ICD-10-CM | POA: Diagnosis not present

## 2018-07-23 DIAGNOSIS — M25511 Pain in right shoulder: Secondary | ICD-10-CM | POA: Diagnosis not present

## 2018-07-26 DIAGNOSIS — M25511 Pain in right shoulder: Secondary | ICD-10-CM | POA: Diagnosis not present

## 2018-07-26 DIAGNOSIS — M75111 Incomplete rotator cuff tear or rupture of right shoulder, not specified as traumatic: Secondary | ICD-10-CM | POA: Diagnosis not present

## 2018-07-26 DIAGNOSIS — Z9889 Other specified postprocedural states: Secondary | ICD-10-CM | POA: Diagnosis not present

## 2018-07-27 DIAGNOSIS — H40013 Open angle with borderline findings, low risk, bilateral: Secondary | ICD-10-CM | POA: Diagnosis not present

## 2018-07-27 DIAGNOSIS — H33321 Round hole, right eye: Secondary | ICD-10-CM | POA: Diagnosis not present

## 2018-07-27 DIAGNOSIS — H2513 Age-related nuclear cataract, bilateral: Secondary | ICD-10-CM | POA: Diagnosis not present

## 2018-07-27 DIAGNOSIS — Z83511 Family history of glaucoma: Secondary | ICD-10-CM | POA: Diagnosis not present

## 2018-08-03 DIAGNOSIS — M25511 Pain in right shoulder: Secondary | ICD-10-CM | POA: Diagnosis not present

## 2018-08-03 DIAGNOSIS — M75111 Incomplete rotator cuff tear or rupture of right shoulder, not specified as traumatic: Secondary | ICD-10-CM | POA: Diagnosis not present

## 2018-08-11 DIAGNOSIS — M75111 Incomplete rotator cuff tear or rupture of right shoulder, not specified as traumatic: Secondary | ICD-10-CM | POA: Diagnosis not present

## 2018-08-11 DIAGNOSIS — M25511 Pain in right shoulder: Secondary | ICD-10-CM | POA: Diagnosis not present

## 2018-08-17 DIAGNOSIS — M75111 Incomplete rotator cuff tear or rupture of right shoulder, not specified as traumatic: Secondary | ICD-10-CM | POA: Diagnosis not present

## 2018-08-17 DIAGNOSIS — M25511 Pain in right shoulder: Secondary | ICD-10-CM | POA: Diagnosis not present

## 2018-08-18 DIAGNOSIS — E291 Testicular hypofunction: Secondary | ICD-10-CM | POA: Diagnosis not present

## 2018-08-18 DIAGNOSIS — E538 Deficiency of other specified B group vitamins: Secondary | ICD-10-CM | POA: Diagnosis not present

## 2018-08-23 DIAGNOSIS — M75111 Incomplete rotator cuff tear or rupture of right shoulder, not specified as traumatic: Secondary | ICD-10-CM | POA: Diagnosis not present

## 2018-08-23 DIAGNOSIS — M25511 Pain in right shoulder: Secondary | ICD-10-CM | POA: Diagnosis not present

## 2018-09-03 DIAGNOSIS — M75111 Incomplete rotator cuff tear or rupture of right shoulder, not specified as traumatic: Secondary | ICD-10-CM | POA: Diagnosis not present

## 2018-09-03 DIAGNOSIS — M25511 Pain in right shoulder: Secondary | ICD-10-CM | POA: Diagnosis not present

## 2018-09-08 DIAGNOSIS — M25511 Pain in right shoulder: Secondary | ICD-10-CM | POA: Diagnosis not present

## 2018-09-30 DIAGNOSIS — E291 Testicular hypofunction: Secondary | ICD-10-CM | POA: Diagnosis not present

## 2018-09-30 DIAGNOSIS — E538 Deficiency of other specified B group vitamins: Secondary | ICD-10-CM | POA: Diagnosis not present

## 2018-11-02 DIAGNOSIS — E538 Deficiency of other specified B group vitamins: Secondary | ICD-10-CM | POA: Diagnosis not present

## 2018-11-02 DIAGNOSIS — E291 Testicular hypofunction: Secondary | ICD-10-CM | POA: Diagnosis not present

## 2018-11-16 ENCOUNTER — Encounter: Payer: Self-pay | Admitting: Physician Assistant

## 2018-11-16 ENCOUNTER — Ambulatory Visit (INDEPENDENT_AMBULATORY_CARE_PROVIDER_SITE_OTHER): Payer: Medicare Other | Admitting: Physician Assistant

## 2018-11-16 VITALS — BP 146/82 | HR 73 | Ht 69.0 in | Wt 234.8 lb

## 2018-11-16 DIAGNOSIS — E785 Hyperlipidemia, unspecified: Secondary | ICD-10-CM

## 2018-11-16 DIAGNOSIS — I25110 Atherosclerotic heart disease of native coronary artery with unstable angina pectoris: Secondary | ICD-10-CM | POA: Diagnosis not present

## 2018-11-16 DIAGNOSIS — Z9989 Dependence on other enabling machines and devices: Secondary | ICD-10-CM | POA: Diagnosis not present

## 2018-11-16 DIAGNOSIS — G4733 Obstructive sleep apnea (adult) (pediatric): Secondary | ICD-10-CM | POA: Diagnosis not present

## 2018-11-16 DIAGNOSIS — E039 Hypothyroidism, unspecified: Secondary | ICD-10-CM | POA: Diagnosis not present

## 2018-11-16 DIAGNOSIS — I10 Essential (primary) hypertension: Secondary | ICD-10-CM | POA: Diagnosis not present

## 2018-11-16 DIAGNOSIS — I200 Unstable angina: Secondary | ICD-10-CM | POA: Diagnosis not present

## 2018-11-16 DIAGNOSIS — R600 Localized edema: Secondary | ICD-10-CM | POA: Diagnosis not present

## 2018-11-16 DIAGNOSIS — R0602 Shortness of breath: Secondary | ICD-10-CM

## 2018-11-16 MED ORDER — METOPROLOL TARTRATE 25 MG PO TABS: 12.5000 mg | ORAL_TABLET | Freq: Two times a day (BID) | ORAL | 0 refills | Status: DC

## 2018-11-16 NOTE — Progress Notes (Signed)
Cardiology Office Note    Date:  11/17/2018   ID:  [name], [name] [birth year], MRN [id number]  PCP:  Crist Infante, MD  Cardiologist: Dr. Martinique  Chief Complaint  Patient presents with  . Follow-up    seen for Dr. Martinique.     History of Present Illness:  [name] is a 68 y.o. male with past medical history of HTN, HLD, CAD, OSA on CPAP,  hypothyroidism and benign paroxysmal positional vertigo.  Patient had stent to mid LAD and RCA in 2004.  Last cardiac catheterization was performed on 06/2010 which showed nonobstructive disease, patent stent.  He was seen by Fabian Sharp in August 2019 for dyspnea.  Echocardiogram obtained on 04/28/2018 demonstrated normal EF 60 to 65%, with grade 1 DD.  He returned in October 2019 with worsening dyspnea.  Myoview was repeated on 07/16/2018 which showed horizontal ST depression in inferior leads, hypertensive response, normal perfusion, EF 59%.   Patient presents today for cardiology office visit.  Despite normal perfusion seen on the previous stress test, he continued to have significant left-sided chest pain with exertion.  This is becoming more frequent and occurs with minimal exertion.  The chest pain does not occur at rest.  He does have at least 1+ pitting edema in bilateral lower extremity, I recommended conservative management using leg elevation and salt restriction.  His symptom is concerning for progressive class III angina, I discussed the case with DOD Dr. Ellyn Hack who agree for the patient to undergo cardiac catheterization.   Past Medical History:  Diagnosis Date  . Benign paroxysmal positional vertigo   . Coronary artery disease 2004   with prior stenting of the mid LAD and mid right coronary  / cardiac cath with satifsfactory finding following abnormal stress test  . Depressive disorder, not elsewhere classified   . Gastritis   . Gluten enteropathy   . Heart palpitations    went to ER on 04/27/2006 for palpitations  .  Hypercholesteremia   . Hypertension   . Hypothyroidism   . Iron deficiency anemia, unspecified   . Myalgia and myositis, unspecified   . Personal history of colonic polyps 10/2006   hyperplastic polyp  . SOB (shortness of breath)     Past Surgical History:  Procedure Laterality Date  . CARDIAC CATHETERIZATION  08/25/2003   EF estimated 65% / stenting of the mid left  anterior descending and mid right coronary artery in 2004 with drug-eluting stent/Two vessel obstructive atherosclerotic coronary artery disease. / Normal left ventricular function. / Successful stenting of the mid right coronary artery and the mid left anterior descending  . CARDIAC CATHETERIZATION  07/17/2010   EF estimated at 65% / Left ventricular angiography was performed in the RAO view  This demonstrates normal left ventricular size and contractility with normal  systolic function / Nonobstructive atherosclerotic coronary disease with continued  excellent patency of the stents in the mid LAD & mid right coronary artery / Normal left ventricular function  . COLONOSCOPY    . OSTEOTOMY     removal of bone spur on (L) arm  . POLYPECTOMY      Current Medications: Outpatient Medications Prior to Visit  Medication Sig Dispense Refill  . amLODipine (NORVASC) 5 MG tablet Take 5 mg by mouth daily.     [name] aspirin 81 MG tablet Take 81 mg by mouth daily.    . cyanocobalamin (,VITAMIN B-12,) 1000 MCG/ML injection Inject 1,000 mcg into the muscle every 30 (thirty)  days.      . ezetimibe (ZETIA) 10 MG tablet Take 10 mg by mouth daily.      [name] ketoconazole (NIZORAL) 2 % shampoo Apply 1 application topically 3 (three) times a week.     . levothyroxine (SYNTHROID, LEVOTHROID) 200 MCG tablet Take 200-300 mcg by mouth See admin instructions. Take 300 mcg in the morning on Sun and Wed. Take 200 mcg in the morning on Mon, Tue, Thurs, Fri, and Sat    . nitroGLYCERIN (NITROSTAT) 0.4 MG SL tablet Place 1 tablet (0.4 mg total) under the tongue  every 5 (five) minutes as needed for chest pain. 25 tablet 3  . ramipril (ALTACE) 10 MG capsule Take 10 mg by mouth daily.    . sildenafil (REVATIO) 20 MG tablet Take 80 mg by mouth daily as needed (ED).     [name] buPROPion (WELLBUTRIN XL) 150 MG 24 hr tablet Take 150 mg by mouth daily.     [name] co-enzyme Q-10 30 MG capsule Take 30 mg by mouth 3 (three) times daily.    . furosemide (LASIX) 20 MG tablet Take one tablet (20 mg) for three days only. 30 tablet 0  . simvastatin (ZOCOR) 20 MG tablet Take 10 mg by mouth daily with supper.     . TESTOSTERONE TD Inject 2 mLs as directed as directed.      No facility-administered medications prior to visit.      Allergies:   Patient has no known allergies.   Social History   Socioeconomic History  . Marital status: Married    Spouse name: Not on file  . Number of children: 2  . Years of education: Not on file  . Highest education level: Not on file  Occupational History  . Occupation: Health visitor: [employer]  . Financial resource strain: Not on file  . Food insecurity:    Worry: Not on file    Inability: Not on file  . Transportation needs:    Medical: Not on file    Non-medical: Not on file  Tobacco Use  . Smoking status: Former Smoker    Packs/day: 1.00    Types: Cigarettes    Last attempt to quit: 09/22/2000    Years since quitting: 18.1  . Smokeless tobacco: Never Used  Substance and Sexual Activity  . Alcohol use: Yes    Alcohol/week: 7.0 standard drinks    Types: 7 Glasses of wine per week  . Drug use: No  . Sexual activity: Not on file  Lifestyle  . Physical activity:    Days per week: Not on file    Minutes per session: Not on file  . Stress: Not on file  Relationships  . Social connections:    Talks on phone: Not on file    Gets together: Not on file    Attends religious service: Not on file    Active member of club or organization: Not on file    Attends meetings of clubs or organizations:  Not on file    Relationship status: Not on file  Other Topics Concern  . Not on file  Social History Narrative  . Not on file     Family History:  The patient's family history includes Coronary artery disease in his brother and brother; Diabetes in his brother; Heart attack in his father; Pancreatic cancer in his mother; Prostate cancer in his father and paternal uncle; Stroke in his brother.   ROS:  Please see the history of present illness.    ROS All other systems reviewed and are negative.   PHYSICAL EXAM:   VS:  BP (!) 146/82   Pulse 73   Ht 5\' 9"  (1.753 m)   Wt 234 lb 12.8 oz (106.5 kg)   BMI 34.67 kg/m    GEN: Well nourished, well developed, in no acute distress  HEENT: normal  Neck: no JVD, carotid bruits, or masses Cardiac: RRR; no murmurs, rubs, or gallops. Trace edema  Respiratory:  clear to auscultation bilaterally, normal work of breathing GI: soft, nontender, nondistended, + BS MS: no deformity or atrophy  Skin: warm and dry, no rash Neuro:  Alert and Oriented x 3, Strength and sensation are intact Psych: euthymic mood, full affect  Wt Readings from Last 3 Encounters:  11/16/18 234 lb 12.8 oz (106.5 kg)  07/16/18 235 lb (106.6 kg)  07/13/18 235 lb (106.6 kg)      Studies/Labs Reviewed:   EKG:  EKG is ordered today.  The ekg ordered today demonstrates normal sinus rhythm without significant ST-T wave changes, poor R wave progression anterior leads.  Recent Labs: No results found for requested labs within last 8760 hours.   Lipid Panel No results found for: CHOL, TRIG, HDL, CHOLHDL, VLDL, LDLCALC, LDLDIRECT  Additional studies/ records that were reviewed today include:   Cath 08/25/2003 HEMODYNAMIC DATA:  1. Aortic pressure was 138/86 with a mean of 109.  2. Left ventricular pressure was 135 with EDP of 21 mmHg.   ANGIOGRAPHIC DATA:  1. The left coronary artery arises and distributes normally.  The left main     coronary artery is normal.  2.  Left anterior descending artery demonstrates an eccentric 70% stenosis in     the mid LAD.  3. The left circumflex coronary artery has minor wall irregularities less     than 10%.  4. The right coronary artery had moderately severe stenosis in the mid     vessel with sequential 80% lesions.   LEFT VENTRICULOGRAPHY:  Left ventricular angiography performed in the RAO  view demonstrates normal left ventricular size and contractility with normal  systolic function.  Ejection fraction is estimated 65%.  Wall motion  analysis demonstrates normal thickening and contractility of all segments.   DESCRIPTION OF PROCEDURE:  We proceeded with intervention of both the mid  LAD and mid right coronary artery.  We addressed the mid right coronary  artery lesion first.  This is crossed and pre dilated with a 2.75 mm Quantum  Maverick balloon up to a maximum of 12 atmospheres.  We then placed the 3.0  x 28 mm Cypher drug-eluting stent across the lesion and deployed this stent  at 11 atmospheres.  We post dilated to 14 atmospheres with an excellent  angiographic result and 0% residual stenosis.  There was TIMI grade 3 flow.   We then addressed the LAD lesion.  Using the same wire we crossed the lesion  without difficulty.  This lesion was primarily stented using a 2.5 x 13 mm  Cypher drug-eluting stent deploying at 11 atmospheres and post dilating to  14 atmospheres.  This yielded an excellent angiographic result with 0%  residual stenosis.   FINAL INTERPRETATION:  1. Two vessel obstructive atherosclerotic coronary artery disease.  2. Normal left ventricular function.  3. Successful stenting of the mid right coronary artery and the mid left     anterior descending.   Cath 07/16/2010    Myoview 07/16/2018  Study Highlights    Clinically negative, electrically positive for ischemia  Horizontal ST segment depression ST segment depression was noted during stress in the II, III and aVF leads,  beginning at 6 minutes of stress, and returning to baseline after 1-5 minutes of recovery.  Hypertensive response  Normal perfusion.  LVEF 59%  Low risk scan       ASSESSMENT:    1. Progressive angina ([place])   2. Coronary artery disease involving native coronary artery of native heart with unstable angina pectoris ([place])   3. SOB (shortness of breath)   4. OSA on CPAP   5. Essential hypertension   6. Hyperlipidemia LDL goal <70   7. Hypothyroidism, unspecified type   8. Lower extremity edema      PLAN:  In order of problems listed above:  1. Progressive angina: Symptom consistent with class III angina.  Myoview in October 2019 showed normal perfusion but patient had significant ST depression in the inferior leads during stress.  His symptom is very worrisome for progressively worsening angina.  I discussed the case with DOD Dr. Ellyn Hack, we will pursue cardiac catheterization.  - Risk and benefit of procedure explained to the patient who display clear understanding and agree to proceed.  Discussed with patient possible procedural risk include bleeding, vascular injury, renal injury, arrythmia, MI, stroke and loss of limb or life.  2. CAD: On aspirin and Zocor.  3. Lower extremity edema: He has trace amount of lower extremity edema.  We will continue conservative management with leg elevation.  I do not think he necessarily need a scheduled diuretic at this time  4. Hypertension: Blood pressure elevated today, add metoprolol tartrate 12.5 mg twice daily  5. Hyperlipidemia: Continue Zocor 20 mg daily  6. Hypothyroidism: On Synthroid, managed by primary care provider    Medication Adjustments/Labs and Tests Ordered: Current medicines are reviewed at length with the patient today.  Concerns regarding medicines are outlined above.  Medication changes, Labs and Tests ordered today are listed in the Patient Instructions below. Patient Instructions  Medication Instructions:    START METOPROLOL TARTRATE 12.5 MG 2 TIMES A DAY  If you need a refill on your cardiac medications before your next appointment, please call your pharmacy.   Lab work: YOU WILL NEED TO HAVE LABS DRAWN  If you have labs (blood work) drawn today and your tests are completely normal, you will receive your results only by: [name] MyChart Message (if you have MyChart) OR . A paper copy in the mail If you have any lab test that is abnormal or we need to change your treatment, we will call you to review the results.  Testing/Procedures: You scheduled for a Heart Cath   Follow-Up: At [facility], you and your health needs are our priority.  As part of our continuing mission to provide you with exceptional heart care, we have created designated Provider Care Teams.  These Care Teams include your primary Cardiologist (physician) and Advanced Practice Providers (APPs -  Physician Assistants and Nurse Practitioners) who all work together to provide you with the care you need, when you need it. You will need a follow up appointment in 3-4 months with  Peter Martinique, MD or one of the following.       [place] [address] Dept: [phone] Loc: [phone]  SAMAR DASS  11/16/2018  You are scheduled for a Cardiac Catheterization  on Tuesday, March 3 with Dr. Peter Martinique.  1. Please arrive at the [facility] (Main Entrance A) at [facility]: [address] [place], [place] [address] at 9:00 AM (This time is two hours before your procedure to ensure your preparation). Free valet parking service is available.   Special note: Every effort is made to have your procedure done on time. Please understand that emergencies sometimes delay scheduled procedures.  2. Diet: Do not eat solid foods after midnight.  The patient may have clear liquids until 5am upon the day of the  procedure.  3. Labs: You will need to return on Wednesday November 17, 2018- Friday November 19, 2018 to have blood drawn. You do not need to be fasting.  4. Medication instructions in preparation for your procedure:   Contrast Allergy: No   On the morning of your procedure, take your Aspirin and any morning medicines NOT listed above.  You may use sips of water.  5. Plan for one night stay--bring personal belongings. 6. Bring a current list of your medications and current insurance cards. 7. You MUST have a responsible person to drive you home. 8. Someone MUST be with you the first 24 hours after you arrive home or your discharge will be delayed. 9. Please wear clothes that are easy to get on and off and wear slip-on shoes.  Thank you for allowing [place] to care for you!   -- [facility]    Signed, Almyra Deforest, [place]  11/17/2018 4:24 PM    [address], [place], [place]  [address] Phone: [phone]; Fax: [phone]

## 2018-11-16 NOTE — H&P (View-Only) (Signed)
Cardiology Office Note    Date:  11/17/2018   ID:  Joseph, Murphy [birth year], MRN [id number]  PCP:  Crist Infante, MD  Cardiologist: Dr. Martinique  Chief Complaint  Patient presents with  . Follow-up    seen for Dr. Martinique.     History of Present Illness:  [name] is a 68 y.o. male with past medical history of HTN, HLD, CAD, OSA on CPAP,  hypothyroidism and benign paroxysmal positional vertigo.  Patient had stent to mid LAD and RCA in 2004.  Last cardiac catheterization was performed on 06/2010 which showed nonobstructive disease, patent stent.  He was seen by Fabian Sharp in August 2019 for dyspnea.  Echocardiogram obtained on 04/28/2018 demonstrated normal EF 60 to 65%, with grade 1 DD.  He returned in October 2019 with worsening dyspnea.  Myoview was repeated on 07/16/2018 which showed horizontal ST depression in inferior leads, hypertensive response, normal perfusion, EF 59%.   Patient presents today for cardiology office visit.  Despite normal perfusion seen on the previous stress test, he continued to have significant left-sided chest pain with exertion.  This is becoming more frequent and occurs with minimal exertion.  The chest pain does not occur at rest.  He does have at least 1+ pitting edema in bilateral lower extremity, I recommended conservative management using leg elevation and salt restriction.  His symptom is concerning for progressive class III angina, I discussed the case with DOD Dr. Ellyn Hack who agree for the patient to undergo cardiac catheterization.   Past Medical History:  Diagnosis Date  . Benign paroxysmal positional vertigo   . Coronary artery disease 2004   with prior stenting of the mid LAD and mid right coronary  / cardiac cath with satifsfactory finding following abnormal stress test  . Depressive disorder, not elsewhere classified   . Gastritis   . Gluten enteropathy   . Heart palpitations    went to ER on 04/27/2006 for palpitations  .  Hypercholesteremia   . Hypertension   . Hypothyroidism   . Iron deficiency anemia, unspecified   . Myalgia and myositis, unspecified   . Personal history of colonic polyps 10/2006   hyperplastic polyp  . SOB (shortness of breath)     Past Surgical History:  Procedure Laterality Date  . CARDIAC CATHETERIZATION  08/25/2003   EF estimated 65% / stenting of the mid left  anterior descending and mid right coronary artery in 2004 with drug-eluting stent/Two vessel obstructive atherosclerotic coronary artery disease. / Normal left ventricular function. / Successful stenting of the mid right coronary artery and the mid left anterior descending  . CARDIAC CATHETERIZATION  07/17/2010   EF estimated at 65% / Left ventricular angiography was performed in the RAO view  This demonstrates normal left ventricular size and contractility with normal  systolic function / Nonobstructive atherosclerotic coronary disease with continued  excellent patency of the stents in the mid LAD & mid right coronary artery / Normal left ventricular function  . COLONOSCOPY    . OSTEOTOMY     removal of bone spur on (L) arm  . POLYPECTOMY      Current Medications: Outpatient Medications Prior to Visit  Medication Sig Dispense Refill  . amLODipine (NORVASC) 5 MG tablet Take 5 mg by mouth daily.     [name] aspirin 81 MG tablet Take 81 mg by mouth daily.    . cyanocobalamin (,VITAMIN B-12,) 1000 MCG/ML injection Inject 1,000 mcg into the muscle every 30 (thirty)  days.      . ezetimibe (ZETIA) 10 MG tablet Take 10 mg by mouth daily.      [name] ketoconazole (NIZORAL) 2 % shampoo Apply 1 application topically 3 (three) times a week.     . levothyroxine (SYNTHROID, LEVOTHROID) 200 MCG tablet Take 200-300 mcg by mouth See admin instructions. Take 300 mcg in the morning on Sun and Wed. Take 200 mcg in the morning on Mon, Tue, Thurs, Fri, and Sat    . nitroGLYCERIN (NITROSTAT) 0.4 MG SL tablet Place 1 tablet (0.4 mg total) under the tongue  every 5 (five) minutes as needed for chest pain. 25 tablet 3  . ramipril (ALTACE) 10 MG capsule Take 10 mg by mouth daily.    . sildenafil (REVATIO) 20 MG tablet Take 80 mg by mouth daily as needed (ED).     [name] buPROPion (WELLBUTRIN XL) 150 MG 24 hr tablet Take 150 mg by mouth daily.     [name] co-enzyme Q-10 30 MG capsule Take 30 mg by mouth 3 (three) times daily.    . furosemide (LASIX) 20 MG tablet Take one tablet (20 mg) for three days only. 30 tablet 0  . simvastatin (ZOCOR) 20 MG tablet Take 10 mg by mouth daily with supper.     . TESTOSTERONE TD Inject 2 mLs as directed as directed.      No facility-administered medications prior to visit.      Allergies:   Patient has no known allergies.   Social History   Socioeconomic History  . Marital status: Married    Spouse name: Not on file  . Number of children: 2  . Years of education: Not on file  . Highest education level: Not on file  Occupational History  . Occupation: Health visitor: [employer]  . Financial resource strain: Not on file  . Food insecurity:    Worry: Not on file    Inability: Not on file  . Transportation needs:    Medical: Not on file    Non-medical: Not on file  Tobacco Use  . Smoking status: Former Smoker    Packs/day: 1.00    Types: Cigarettes    Last attempt to quit: 09/22/2000    Years since quitting: 18.1  . Smokeless tobacco: Never Used  Substance and Sexual Activity  . Alcohol use: Yes    Alcohol/week: 7.0 standard drinks    Types: 7 Glasses of wine per week  . Drug use: No  . Sexual activity: Not on file  Lifestyle  . Physical activity:    Days per week: Not on file    Minutes per session: Not on file  . Stress: Not on file  Relationships  . Social connections:    Talks on phone: Not on file    Gets together: Not on file    Attends religious service: Not on file    Active member of club or organization: Not on file    Attends meetings of clubs or organizations:  Not on file    Relationship status: Not on file  Other Topics Concern  . Not on file  Social History Narrative  . Not on file     Family History:  The patient's family history includes Coronary artery disease in his brother and brother; Diabetes in his brother; Heart attack in his father; Pancreatic cancer in his mother; Prostate cancer in his father and paternal uncle; Stroke in his brother.   ROS:  Please see the history of present illness.    ROS All other systems reviewed and are negative.   PHYSICAL EXAM:   VS:  BP (!) 146/82   Pulse 73   Ht 5\' 9"  (1.753 m)   Wt 234 lb 12.8 oz (106.5 kg)   BMI 34.67 kg/m    GEN: Well nourished, well developed, in no acute distress  HEENT: normal  Neck: no JVD, carotid bruits, or masses Cardiac: RRR; no murmurs, rubs, or gallops. Trace edema  Respiratory:  clear to auscultation bilaterally, normal work of breathing GI: soft, nontender, nondistended, + BS MS: no deformity or atrophy  Skin: warm and dry, no rash Neuro:  Alert and Oriented x 3, Strength and sensation are intact Psych: euthymic mood, full affect  Wt Readings from Last 3 Encounters:  11/16/18 234 lb 12.8 oz (106.5 kg)  07/16/18 235 lb (106.6 kg)  07/13/18 235 lb (106.6 kg)      Studies/Labs Reviewed:   EKG:  EKG is ordered today.  The ekg ordered today demonstrates normal sinus rhythm without significant ST-T wave changes, poor R wave progression anterior leads.  Recent Labs: No results found for requested labs within last 8760 hours.   Lipid Panel No results found for: CHOL, TRIG, HDL, CHOLHDL, VLDL, LDLCALC, LDLDIRECT  Additional studies/ records that were reviewed today include:   Cath 08/25/2003 HEMODYNAMIC DATA:  1. Aortic pressure was 138/86 with a mean of 109.  2. Left ventricular pressure was 135 with EDP of 21 mmHg.   ANGIOGRAPHIC DATA:  1. The left coronary artery arises and distributes normally.  The left main     coronary artery is normal.  2.  Left anterior descending artery demonstrates an eccentric 70% stenosis in     the mid LAD.  3. The left circumflex coronary artery has minor wall irregularities less     than 10%.  4. The right coronary artery had moderately severe stenosis in the mid     vessel with sequential 80% lesions.   LEFT VENTRICULOGRAPHY:  Left ventricular angiography performed in the RAO  view demonstrates normal left ventricular size and contractility with normal  systolic function.  Ejection fraction is estimated 65%.  Wall motion  analysis demonstrates normal thickening and contractility of all segments.   DESCRIPTION OF PROCEDURE:  We proceeded with intervention of both the mid  LAD and mid right coronary artery.  We addressed the mid right coronary  artery lesion first.  This is crossed and pre dilated with a 2.75 mm Quantum  Maverick balloon up to a maximum of 12 atmospheres.  We then placed the 3.0  x 28 mm Cypher drug-eluting stent across the lesion and deployed this stent  at 11 atmospheres.  We post dilated to 14 atmospheres with an excellent  angiographic result and 0% residual stenosis.  There was TIMI grade 3 flow.   We then addressed the LAD lesion.  Using the same wire we crossed the lesion  without difficulty.  This lesion was primarily stented using a 2.5 x 13 mm  Cypher drug-eluting stent deploying at 11 atmospheres and post dilating to  14 atmospheres.  This yielded an excellent angiographic result with 0%  residual stenosis.   FINAL INTERPRETATION:  1. Two vessel obstructive atherosclerotic coronary artery disease.  2. Normal left ventricular function.  3. Successful stenting of the mid right coronary artery and the mid left     anterior descending.   Cath 07/16/2010    Myoview 07/16/2018  Study Highlights    Clinically negative, electrically positive for ischemia  Horizontal ST segment depression ST segment depression was noted during stress in the II, III and aVF leads,  beginning at 6 minutes of stress, and returning to baseline after 1-5 minutes of recovery.  Hypertensive response  Normal perfusion.  LVEF 59%  Low risk scan       ASSESSMENT:    1. Progressive angina ([place])   2. Coronary artery disease involving native coronary artery of native heart with unstable angina pectoris ([place])   3. SOB (shortness of breath)   4. OSA on CPAP   5. Essential hypertension   6. Hyperlipidemia LDL goal <70   7. Hypothyroidism, unspecified type   8. Lower extremity edema      PLAN:  In order of problems listed above:  1. Progressive angina: Symptom consistent with class III angina.  Myoview in October 2019 showed normal perfusion but patient had significant ST depression in the inferior leads during stress.  His symptom is very worrisome for progressively worsening angina.  I discussed the case with DOD Dr. Ellyn Hack, we will pursue cardiac catheterization.  - Risk and benefit of procedure explained to the patient who display clear understanding and agree to proceed.  Discussed with patient possible procedural risk include bleeding, vascular injury, renal injury, arrythmia, MI, stroke and loss of limb or life.  2. CAD: On aspirin and Zocor.  3. Lower extremity edema: He has trace amount of lower extremity edema.  We will continue conservative management with leg elevation.  I do not think he necessarily need a scheduled diuretic at this time  4. Hypertension: Blood pressure elevated today, add metoprolol tartrate 12.5 mg twice daily  5. Hyperlipidemia: Continue Zocor 20 mg daily  6. Hypothyroidism: On Synthroid, managed by primary care provider    Medication Adjustments/Labs and Tests Ordered: Current medicines are reviewed at length with the patient today.  Concerns regarding medicines are outlined above.  Medication changes, Labs and Tests ordered today are listed in the Patient Instructions below. Patient Instructions  Medication Instructions:    START METOPROLOL TARTRATE 12.5 MG 2 TIMES A DAY  If you need a refill on your cardiac medications before your next appointment, please call your pharmacy.   Lab work: YOU WILL NEED TO HAVE LABS DRAWN  If you have labs (blood work) drawn today and your tests are completely normal, you will receive your results only by: [name] MyChart Message (if you have MyChart) OR . A paper copy in the mail If you have any lab test that is abnormal or we need to change your treatment, we will call you to review the results.  Testing/Procedures: You scheduled for a Heart Cath   Follow-Up: At [facility], you and your health needs are our priority.  As part of our continuing mission to provide you with exceptional heart care, we have created designated Provider Care Teams.  These Care Teams include your primary Cardiologist (physician) and Advanced Practice Providers (APPs -  Physician Assistants and Nurse Practitioners) who all work together to provide you with the care you need, when you need it. You will need a follow up appointment in 3-4 months with  Peter Martinique, MD or one of the following.       [place] [address] Dept: [phone] Loc: [phone]  DUAN SCHARNHORST  11/16/2018  You are scheduled for a Cardiac Catheterization  on Tuesday, March 3 with Dr. Peter Martinique.  1. Please arrive at the [facility] (Main Entrance A) at [facility]: [address] [place], [place] [address] at 9:00 AM (This time is two hours before your procedure to ensure your preparation). Free valet parking service is available.   Special note: Every effort is made to have your procedure done on time. Please understand that emergencies sometimes delay scheduled procedures.  2. Diet: Do not eat solid foods after midnight.  The patient may have clear liquids until 5am upon the day of the  procedure.  3. Labs: You will need to return on Wednesday November 17, 2018- Friday November 19, 2018 to have blood drawn. You do not need to be fasting.  4. Medication instructions in preparation for your procedure:   Contrast Allergy: No   On the morning of your procedure, take your Aspirin and any morning medicines NOT listed above.  You may use sips of water.  5. Plan for one night stay--bring personal belongings. 6. Bring a current list of your medications and current insurance cards. 7. You MUST have a responsible person to drive you home. 8. Someone MUST be with you the first 24 hours after you arrive home or your discharge will be delayed. 9. Please wear clothes that are easy to get on and off and wear slip-on shoes.  Thank you for allowing [place] to care for you!   -- [facility] Invasive Cardiovascular services    Signed, Almyra Deforest, [place]  11/17/2018 4:24 PM    [place], [place], [place]  [address] Phone: [phone]; Fax: [phone]

## 2018-11-16 NOTE — Patient Instructions (Addendum)
Medication Instructions:  START METOPROLOL TARTRATE 12.5 MG 2 TIMES A DAY  If you need a refill on your cardiac medications before your next appointment, please call your pharmacy.   Lab work: YOU WILL NEED TO HAVE LABS DRAWN  If you have labs (blood work) drawn today and your tests are completely normal, you will receive your results only by: [name] MyChart Message (if you have MyChart) OR . A paper copy in the mail If you have any lab test that is abnormal or we need to change your treatment, we will call you to review the results.  Testing/Procedures: You scheduled for a Heart Cath   Follow-Up: At [facility], you and your health needs are our priority.  As part of our continuing mission to provide you with exceptional heart care, we have created designated Provider Care Teams.  These Care Teams include your primary Cardiologist (physician) and Advanced Practice Providers (APPs -  Physician Assistants and Nurse Practitioners) who all work together to provide you with the care you need, when you need it. You will need a follow up appointment in 3-4 months with  Joseph Martinique, MD or one of the following.       [place] [address] Dept: [phone] Loc: [phone]  [name]  11/16/2018  You are scheduled for a Cardiac Catheterization on Tuesday, March 3 with Dr. Peter Murphy.  1. Please arrive at the [facility] (Main Entrance A) at [facility]: [address] [place], [place] [address] at 9:00 AM (This time is two hours before your procedure to ensure your preparation). Free valet parking service is available.   Special note: Every effort is made to have your procedure done on time. Please understand that emergencies sometimes delay scheduled procedures.  2. Diet: Do not eat solid foods after midnight.  The patient may have clear liquids until 5am upon  the day of the procedure.  3. Labs: You will need to return on Wednesday November 17, 2018- Friday November 19, 2018 to have blood drawn. You do not need to be fasting.  4. Medication instructions in preparation for your procedure:   Contrast Allergy: No   On the morning of your procedure, take your Aspirin and any morning medicines NOT listed above.  You may use sips of water.  5. Plan for one night stay--bring personal belongings. 6. Bring a current list of your medications and current insurance cards. 7. You MUST have a responsible person to drive you home. 8. Someone MUST be with you the first 24 hours after you arrive home or your discharge will be delayed. 9. Please wear clothes that are easy to get on and off and wear slip-on shoes.  Thank you for allowing [place] to care for you!   -- [facility] Cardi[facility] services

## 2018-11-17 ENCOUNTER — Encounter: Payer: Self-pay | Admitting: Physician Assistant

## 2018-11-17 ENCOUNTER — Other Ambulatory Visit: Payer: Self-pay | Admitting: Physician Assistant

## 2018-11-17 DIAGNOSIS — I200 Unstable angina: Secondary | ICD-10-CM | POA: Diagnosis not present

## 2018-11-17 LAB — CBC
HEMOGLOBIN: 13.2 g/dL (ref 13.0–17.7)
Hematocrit: 40.2 % (ref 37.5–51.0)
MCH: 26.7 pg (ref 26.6–33.0)
MCHC: 32.8 g/dL (ref 31.5–35.7)
MCV: 81 fL (ref 79–97)
PLATELETS: 285 10*3/uL (ref 150–450)
RBC: 4.94 x10E6/uL (ref 4.14–5.80)
RDW: 16.5 % — AB (ref 11.6–15.4)
WBC: 5.2 10*3/uL (ref 3.4–10.8)

## 2018-11-17 LAB — BASIC METABOLIC PANEL
BUN/Creatinine Ratio: 17 (ref 10–24)
BUN: 15 mg/dL (ref 8–27)
CO2: 27 mmol/L (ref 20–29)
Calcium: 8.8 mg/dL (ref 8.6–10.2)
Chloride: 104 mmol/L (ref 96–106)
Creatinine, Ser: 0.89 mg/dL (ref 0.76–1.27)
GFR calc Af Amer: 103 mL/min/{1.73_m2} (ref >59)
GFR calc non Af Amer: 89 mL/min/{1.73_m2} (ref >59)
Glucose: 98 mg/dL (ref 65–99)
Potassium: 4 mmol/L (ref 3.5–5.2)
SODIUM: 138 mmol/L (ref 134–144)

## 2018-11-22 ENCOUNTER — Telehealth: Payer: Self-pay | Admitting: *Deleted

## 2018-11-22 NOTE — Telephone Encounter (Signed)
Encounter not needed

## 2018-11-22 NOTE — Telephone Encounter (Signed)
Pt contacted pre-catheterization scheduled at [facility] for: Tuesday November 23, 2018 11 AM Verified arrival time and place: [place] Entrance A at: 9 AM  No solid food after midnight prior to cath, clear liquids until 5 AM day of procedure. Contrast allergy: no Verified no diabetes medications.  Hold: Sildenafil-until post procedure.  AM meds can be  taken pre-cath with sip of water including: ASA 81 mg  Confirmed patient has responsible person to drive home post procedure and observe 24 hours after arriving home: yes

## 2018-11-23 ENCOUNTER — Other Ambulatory Visit: Payer: Self-pay

## 2018-11-23 ENCOUNTER — Ambulatory Visit (HOSPITAL_COMMUNITY)
Admission: RE | Admit: 2018-11-23 | Discharge: 2018-11-23 | Disposition: A | Discharge status: Home / Self Care | Payer: Medicare Other | Attending: Cardiology | Admitting: Cardiology

## 2018-11-23 ENCOUNTER — Encounter (HOSPITAL_COMMUNITY)
Admission: RE | Disposition: A | Discharge status: Home / Self Care | Payer: Self-pay | Source: Home / Self Care | Attending: Cardiology

## 2018-11-23 DIAGNOSIS — G4733 Obstructive sleep apnea (adult) (pediatric): Secondary | ICD-10-CM | POA: Diagnosis not present

## 2018-11-23 DIAGNOSIS — I209 Angina pectoris, unspecified: Secondary | ICD-10-CM | POA: Diagnosis present

## 2018-11-23 DIAGNOSIS — E039 Hypothyroidism, unspecified: Secondary | ICD-10-CM | POA: Insufficient documentation

## 2018-11-23 DIAGNOSIS — E785 Hyperlipidemia, unspecified: Secondary | ICD-10-CM | POA: Diagnosis not present

## 2018-11-23 DIAGNOSIS — Z791 Long term (current) use of non-steroidal anti-inflammatories (NSAID): Secondary | ICD-10-CM | POA: Insufficient documentation

## 2018-11-23 DIAGNOSIS — I25119 Atherosclerotic heart disease of native coronary artery with unspecified angina pectoris: Secondary | ICD-10-CM | POA: Diagnosis not present

## 2018-11-23 DIAGNOSIS — H8110 Benign paroxysmal vertigo, unspecified ear: Secondary | ICD-10-CM | POA: Diagnosis not present

## 2018-11-23 DIAGNOSIS — I10 Essential (primary) hypertension: Secondary | ICD-10-CM | POA: Diagnosis present

## 2018-11-23 DIAGNOSIS — Z7982 Long term (current) use of aspirin: Secondary | ICD-10-CM | POA: Diagnosis not present

## 2018-11-23 DIAGNOSIS — E7800 Pure hypercholesterolemia, unspecified: Secondary | ICD-10-CM | POA: Insufficient documentation

## 2018-11-23 DIAGNOSIS — Z7902 Long term (current) use of antithrombotics/antiplatelets: Secondary | ICD-10-CM | POA: Insufficient documentation

## 2018-11-23 DIAGNOSIS — Z955 Presence of coronary angioplasty implant and graft: Secondary | ICD-10-CM | POA: Insufficient documentation

## 2018-11-23 DIAGNOSIS — Z79890 Hormone replacement therapy (postmenopausal): Secondary | ICD-10-CM | POA: Insufficient documentation

## 2018-11-23 DIAGNOSIS — Z79899 Other long term (current) drug therapy: Secondary | ICD-10-CM | POA: Insufficient documentation

## 2018-11-23 DIAGNOSIS — E669 Obesity, unspecified: Secondary | ICD-10-CM | POA: Diagnosis not present

## 2018-11-23 DIAGNOSIS — Z6833 Body mass index (BMI) 33.0-33.9, adult: Secondary | ICD-10-CM | POA: Diagnosis not present

## 2018-11-23 DIAGNOSIS — I2510 Atherosclerotic heart disease of native coronary artery without angina pectoris: Secondary | ICD-10-CM | POA: Diagnosis present

## 2018-11-23 HISTORY — PX: LEFT HEART CATH AND CORONARY ANGIOGRAPHY: CATH118249

## 2018-11-23 HISTORY — PX: CORONARY STENT INTERVENTION: CATH118234

## 2018-11-23 LAB — POCT ACTIVATED CLOTTING TIME
Activated Clotting Time: 224 s
Activated Clotting Time: 626 s

## 2018-11-23 SURGERY — LEFT HEART CATH AND CORONARY ANGIOGRAPHY
Anesthesia: LOCAL

## 2018-11-23 MED ORDER — ASPIRIN 81 MG PO TABS: 81.0000 mg | ORAL_TABLET | Freq: Every day | ORAL | Status: DC

## 2018-11-23 MED ORDER — VITAMIN D 25 MCG (1000 UNIT) PO TABS: 1000.0000 [IU] | ORAL_TABLET | Freq: Every day | ORAL | Status: DC

## 2018-11-23 MED ORDER — NITROGLYCERIN 1 MG/10 ML FOR IR/CATH LAB
INTRA_ARTERIAL | Status: DC | PRN
  Administered 2018-11-23: 100 ug via INTRACORONARY

## 2018-11-23 MED ORDER — SODIUM CHLORIDE 0.9% FLUSH: 3.0000 mL | Freq: Two times a day (BID) | INTRAVENOUS | Status: DC

## 2018-11-23 MED ORDER — HEPARIN (PORCINE) IN NACL 1000-0.9 UT/500ML-% IV SOLN
INTRAVENOUS | Status: DC | PRN
  Administered 2018-11-23 (×2): 500 mL

## 2018-11-23 MED ORDER — SODIUM CHLORIDE 0.9 % WEIGHT BASED INFUSION: 1.0000 mL/kg/h | INTRAVENOUS | Status: DC

## 2018-11-23 MED ORDER — MIDAZOLAM HCL 2 MG/2ML IJ SOLN
INTRAMUSCULAR | Status: AC
  Filled 2018-11-23: qty 2

## 2018-11-23 MED ORDER — LIDOCAINE HCL (PF) 1 % IJ SOLN
INTRAMUSCULAR | Status: DC | PRN
  Administered 2018-11-23: 2 mL via INTRADERMAL

## 2018-11-23 MED ORDER — ASPIRIN 81 MG PO CHEW: 81.0000 mg | CHEWABLE_TABLET | ORAL | Status: DC

## 2018-11-23 MED ORDER — CLOPIDOGREL BISULFATE 75 MG PO TABS: 75.0000 mg | ORAL_TABLET | Freq: Every day | ORAL | 0 refills | Status: DC

## 2018-11-23 MED ORDER — HEPARIN SODIUM (PORCINE) 1000 UNIT/ML IJ SOLN
INTRAMUSCULAR | Status: AC
  Filled 2018-11-23: qty 1

## 2018-11-23 MED ORDER — HEPARIN SODIUM (PORCINE) 1000 UNIT/ML IJ SOLN
INTRAMUSCULAR | Status: DC | PRN
  Administered 2018-11-23: 3000 [IU] via INTRAVENOUS
  Administered 2018-11-23 (×2): 5000 [IU] via INTRAVENOUS

## 2018-11-23 MED ORDER — EZETIMIBE 10 MG PO TABS: 10.0000 mg | ORAL_TABLET | Freq: Every day | ORAL | Status: DC

## 2018-11-23 MED ORDER — FENTANYL CITRATE (PF) 100 MCG/2ML IJ SOLN
INTRAMUSCULAR | Status: AC
  Filled 2018-11-23: qty 2

## 2018-11-23 MED ORDER — LIDOCAINE HCL (PF) 1 % IJ SOLN
INTRAMUSCULAR | Status: AC
  Filled 2018-11-23: qty 30

## 2018-11-23 MED ORDER — RAMIPRIL 10 MG PO CAPS: 10.0000 mg | ORAL_CAPSULE | Freq: Every day | ORAL | Status: DC

## 2018-11-23 MED ORDER — CLOPIDOGREL BISULFATE 300 MG PO TABS
ORAL_TABLET | ORAL | Status: AC
  Filled 2018-11-23: qty 2

## 2018-11-23 MED ORDER — IOHEXOL 350 MG/ML SOLN
INTRAVENOUS | Status: DC | PRN
  Administered 2018-11-23: 120 mL via INTRACARDIAC

## 2018-11-23 MED ORDER — HEPARIN (PORCINE) IN NACL 1000-0.9 UT/500ML-% IV SOLN
INTRAVENOUS | Status: AC
  Filled 2018-11-23: qty 1000

## 2018-11-23 MED ORDER — SODIUM CHLORIDE 0.9% FLUSH: 3.0000 mL | INTRAVENOUS | Status: DC | PRN

## 2018-11-23 MED ORDER — CLOPIDOGREL BISULFATE 75 MG PO TABS: 75.0000 mg | ORAL_TABLET | Freq: Every day | ORAL | Status: DC

## 2018-11-23 MED ORDER — VERAPAMIL HCL 2.5 MG/ML IV SOLN
INTRAVENOUS | Status: DC | PRN
  Administered 2018-11-23: 10 mL via INTRA_ARTERIAL

## 2018-11-23 MED ORDER — FENTANYL CITRATE (PF) 100 MCG/2ML IJ SOLN
INTRAMUSCULAR | Status: DC | PRN
  Administered 2018-11-23: 25 ug via INTRAVENOUS

## 2018-11-23 MED ORDER — SODIUM CHLORIDE 0.9 % IV SOLN: 250.0000 mL | INTRAVENOUS | Status: DC | PRN

## 2018-11-23 MED ORDER — AMLODIPINE BESYLATE 5 MG PO TABS: 5.0000 mg | ORAL_TABLET | Freq: Every day | ORAL | Status: DC

## 2018-11-23 MED ORDER — ONDANSETRON HCL 4 MG/2ML IJ SOLN: 4.0000 mg | Freq: Four times a day (QID) | INTRAMUSCULAR | Status: DC | PRN

## 2018-11-23 MED ORDER — COQ10 100 MG PO CAPS: 100.0000 mg | ORAL_CAPSULE | Freq: Every day | ORAL | Status: DC

## 2018-11-23 MED ORDER — CLOPIDOGREL BISULFATE 300 MG PO TABS
ORAL_TABLET | ORAL | Status: DC | PRN
  Administered 2018-11-23: 600 mg via ORAL

## 2018-11-23 MED ORDER — LEVOTHYROXINE SODIUM 200 MCG PO TABS: 200.0000 ug | ORAL_TABLET | ORAL | Status: DC

## 2018-11-23 MED ORDER — CLOPIDOGREL BISULFATE 75 MG PO TABS
ORAL_TABLET | ORAL | Status: AC
  Filled 2018-11-23: qty 1

## 2018-11-23 MED ORDER — VERAPAMIL HCL 2.5 MG/ML IV SOLN
INTRAVENOUS | Status: AC
  Filled 2018-11-23: qty 2

## 2018-11-23 MED ORDER — SIMVASTATIN 20 MG PO TABS: 20.0000 mg | ORAL_TABLET | Freq: Every day | ORAL | Status: DC

## 2018-11-23 MED ORDER — SODIUM CHLORIDE 0.9 % WEIGHT BASED INFUSION
3.0000 mL/kg/h | INTRAVENOUS | Status: AC
  Administered 2018-11-23: 3 mL/kg/h via INTRAVENOUS

## 2018-11-23 MED ORDER — MIDAZOLAM HCL 2 MG/2ML IJ SOLN
INTRAMUSCULAR | Status: DC | PRN
  Administered 2018-11-23: 1 mg via INTRAVENOUS

## 2018-11-23 MED ORDER — NITROGLYCERIN 1 MG/10 ML FOR IR/CATH LAB
INTRA_ARTERIAL | Status: AC
  Filled 2018-11-23: qty 10

## 2018-11-23 MED ORDER — CYANOCOBALAMIN 1000 MCG/ML IJ SOLN: 1000.0000 ug | INTRAMUSCULAR | Status: DC

## 2018-11-23 MED ORDER — ACETAMINOPHEN 325 MG PO TABS: 650.0000 mg | ORAL_TABLET | ORAL | Status: DC | PRN

## 2018-11-23 MED ORDER — BUPROPION HCL ER (XL) 300 MG PO TB24: 300.0000 mg | ORAL_TABLET | Freq: Every day | ORAL | Status: DC

## 2018-11-23 MED FILL — CLOPIDOGREL 75 MG TABLET: 75 | 30 days supply | Qty: 30 | Fill #0

## 2018-11-23 SURGICAL SUPPLY — 17 items
BALLN EMERGE MR 2.0X12 (BALLOONS) ×2
BALLN ~~LOC~~ EUPHORA RX 2.75X8 (BALLOONS) ×2
BALLOON EMERGE MR 2.0X12 (BALLOONS) IMPLANT
BALLOON ~~LOC~~ EUPHORA RX 2.75X8 (BALLOONS) IMPLANT
CATH 5FR JL3.5 JR4 ANG PIG MP (CATHETERS) ×1 IMPLANT
CATH LAUNCHER 6FR EBU3.5 (CATHETERS) ×1 IMPLANT
DEVICE RAD COMP TR BAND LRG (VASCULAR PRODUCTS) ×1 IMPLANT
GLIDESHEATH SLEND SS 6F .021 (SHEATH) ×1 IMPLANT
GUIDEWIRE INQWIRE 1.5J.035X260 (WIRE) IMPLANT
INQWIRE 1.5J .035X260CM (WIRE) ×2
KIT ENCORE 26 ADVANTAGE (KITS) ×1 IMPLANT
KIT HEART LEFT (KITS) ×2 IMPLANT
PACK CARDIAC CATHETERIZATION (CUSTOM PROCEDURE TRAY) ×2 IMPLANT
STENT RESOLUTE ONYX 2.5X12 (Permanent Stent) ×1 IMPLANT
TRANSDUCER W/STOPCOCK (MISCELLANEOUS) ×2 IMPLANT
TUBING CIL FLEX 10 FLL-RA (TUBING) ×2 IMPLANT
WIRE ASAHI PROWATER 180CM (WIRE) ×1 IMPLANT

## 2018-11-23 NOTE — Discharge Instructions (Signed)
Radial Site CareDischarge instructions (Order [id number])   Refer to this sheet in the next few weeks. These instructions provide you with information on caring for yourself after your procedure. Your caregiver may also give you more specific instructions. Your treatment has been planned according to current medical practices, but problems sometimes occur. Call your caregiver if you have any problems or questions after your procedure.  HOME CARE INSTRUCTIONS You may shower the day after the procedure.Remove the bandage (dressing) and gently wash the site with plain soap and water.Gently pat the site dry.  Do not apply powder or lotion to the site.  Do not submerge the affected site in water for 3 to 5 days.  Inspect the site at least twice daily.  Do not flex or bend the affected arm for 24 hours.  No lifting over 5 pounds (2.3 kg) for 5 days after your procedure.  Do not drive home if you are discharged the same day of the procedure. Have someone else drive you.  You may drive 24 hours after the procedure unless otherwise instructed by your caregiver.  What to expect: Any bruising will usually fade within 1 to 2 weeks.  Blood that collects in the tissue (hematoma) may be painful to the touch. It should usually decrease in size and tenderness within 1 to 2 weeks.   SEEK IMMEDIATE MEDICAL CARE IF: You have unusual pain at the radial site.  You have redness, warmth, swelling, or pain at the radial site.  You have drainage (other than a small amount of blood on the dressing).  You have chills.  You have a fever or persistent symptoms for more than 72 hours.  You have a fever and your symptoms suddenly get worse.  Your arm becomes pale, cool, tingly, or numb.  You have heavy bleeding from the site. Hold pressure on the site.   PLEASE DO NOT MISS ANY DOSES OF [name] [name]!!!!! Also keep a log of you blood pressures and bring back to your follow up appt. Please call the office with any  questions.   Patients taking blood thinners should generally stay away from medicines like ibuprofen, Advil, Motrin, naproxen, and Aleve due to risk of stomach bleeding. You may take Tylenol as directed or talk to your primary doctor about alternatives.     This sheet gives you information about how to care for yourself after your procedure. Your health care provider may also give you more specific instructions. If you have problems or questions, contact your health care provider. What can I expect after the procedure? After the procedure, it is common to have:  Bruising and tenderness at the catheter insertion area. Follow these instructions at home: Medicines  Take over-the-counter and prescription medicines only as told by your health care provider. Insertion site care  Follow instructions from your health care provider about how to take care of your insertion site. Make sure you: ? Wash your hands with soap and water before you change your bandage (dressing). If soap and water are not available, use hand sanitizer. ? Change your dressing as told by your health care provider. ? Leave stitches (sutures), skin glue, or adhesive strips in place. These skin closures may need to stay in place for 2 weeks or longer. If adhesive strip edges start to loosen and curl up, you may trim the loose edges. Do not remove adhesive strips completely unless your health care provider tells you to do that.  Check your insertion site every day  for signs of infection. Check for: ? Redness, swelling, or pain. ? Fluid or blood. ? Pus or a bad smell. ? Warmth.  Do not take baths, swim, or use a hot tub until your health care provider approves.  You may shower 24-48 hours after the procedure, or as directed by your health care provider. ? Remove the dressing and gently wash the site with plain soap and water. ? Pat the area dry with a clean towel. ? Do not rub the site. That could cause bleeding.  Do not  apply powder or lotion to the site. Activity   For 24 hours after the procedure, or as directed by your health care provider: ? Do not flex or bend the affected arm. ? Do not push or pull heavy objects with the affected arm. ? Do not drive yourself home from the hospital or clinic. You may drive 24 hours after the procedure unless your health care provider tells you not to. ? Do not operate machinery or power tools.  Do not lift anything that is heavier than 10 lb (4.5 kg), or the limit that you are told, until your health care provider says that it is safe.  Ask your health care provider when it is okay to: ? Return to work or school. ? Resume usual physical activities or sports. ? Resume sexual activity. General instructions  If the catheter site starts to bleed, raise your arm and put firm pressure on the site. If the bleeding does not stop, get help right away. This is a medical emergency.  If you went home on the same day as your procedure, a responsible adult should be with you for the first 24 hours after you arrive home.  Keep all follow-up visits as told by your health care provider. This is important. Contact a health care provider if:  You have a fever.  You have redness, swelling, or yellow drainage around your insertion site. Get help right away if:  You have unusual pain at the radial site.  The catheter insertion area swells very fast.  The insertion area is bleeding, and the bleeding does not stop when you hold steady pressure on the area.  Your arm or hand becomes pale, cool, tingly, or numb. These symptoms may represent a serious problem that is an emergency. Do not wait to see if the symptoms will go away. Get medical help right away. Call your local emergency services (911 in the [place] Do not drive yourself to the hospital. Summary  After the procedure, it is common to have bruising and tenderness at the site.  Follow instructions from your health care  provider about how to take care of your radial site wound. Check the wound every day for signs of infection.  Do not lift anything that is heavier than 10 lb (4.5 kg), or the limit that you are told, until your health care provider says that it is safe. This information is not intended to replace advice given to you by your health care provider. Make sure you discuss any questions you have with your health care provider. Document Released: 10/11/2010 Document Revised: 10/14/2017 Document Reviewed: 10/14/2017 Elsevier Interactive Patient Education  2019 [employer]. Moderate Conscious Sedation, Adult, Care After These instructions provide you with information about caring for yourself after your procedure. Your health care provider may also give you more specific instructions. Your treatment has been planned according to current medical practices, but problems sometimes occur. Call your health care provider if  you have any problems or questions after your procedure. What can I expect after the procedure? After your procedure, it is common:  To feel sleepy for several hours.  To feel clumsy and have poor balance for several hours.  To have poor judgment for several hours.  To vomit if you eat too soon. Follow these instructions at home: For at least 24 hours after the procedure:   Do not: ? Participate in activities where you could fall or become injured. ? Drive. ? Use heavy machinery. ? Drink alcohol. ? Take sleeping pills or medicines that cause drowsiness. ? Make important decisions or sign legal documents. ? Take care of children on your own.  Rest. Eating and drinking  Follow the diet recommended by your health care provider.  If you vomit: ? Drink water, juice, or soup when you can drink without vomiting. ? Make sure you have little or no nausea before eating solid foods. General instructions  Have a responsible adult stay with you until you are awake and  alert.  Take over-the-counter and prescription medicines only as told by your health care provider.  If you smoke, do not smoke without supervision.  Keep all follow-up visits as told by your health care provider. This is important. Contact a health care provider if:  You keep feeling nauseous or you keep vomiting.  You feel light-headed.  You develop a rash.  You have a fever. Get help right away if:  You have trouble breathing. This information is not intended to replace advice given to you by your health care provider. Make sure you discuss any questions you have with your health care provider. Document Released: 06/29/2013 Document Revised: 02/11/2016 Document Reviewed: 12/29/2015 Elsevier Interactive Patient Education  2019 [employer].

## 2018-11-23 NOTE — Discharge Summary (Signed)
Discharge Summary/SAME DAY PCI    Patient ID: [name],  MRN: [id number], DOB/AGE: Nov 09, 1950 68 y.o.  Admit date: 11/23/2018 Discharge date: 11/23/2018  Primary Care Provider: Crist Infante Primary Cardiologist: Dr. Martinique  Discharge Diagnoses    Principal Problem:   Angina pectoris [facility] [place]) Active Problems:   Hypertension   Coronary artery disease   Hypercholesteremia   OSA (obstructive sleep apnea)   Obesity (BMI 30-39.9)   Allergies No Known Allergies  Diagnostic Studies/Procedures    Cath: 11/23/2018   Prox RCA to Mid RCA lesion is 15% stenosed.  Previously placed Mid LAD stent (unknown type) is widely patent.  Ost 2nd Mrg lesion is 95% stenosed.  Post intervention, there is a 0% residual stenosis.  A drug-eluting stent was successfully placed using a STENT RESOLUTE ONYX 2.5X12.  The left ventricular systolic function is normal.  LV end diastolic pressure is normal.  The left ventricular ejection fraction is 55-65% by visual estimate.   1. Single vessel obstructive CAD with 95% OM 2 lesion. Prior stents in the LAD and RCA are patent.  2. Normal LV function 3. Normal LVEDP 4. Successful PCI of the OM2 with DES  Plan: DAPT for one year. Patient is a candidate for same day discharge.  _____________   History of Present Illness     68 y.o. male with past medical history of HTN, HLD, CAD, OSA on CPAP,  hypothyroidism and benign paroxysmal positional vertigo.  Patient had stent to mid LAD and RCA in 2004.  Last cardiac catheterization was performed on 06/2010 which showed nonobstructive disease, patent stent.  He was seen by Fabian Sharp in August 2019 for dyspnea.  Echocardiogram obtained on 04/28/2018 demonstrated normal EF 60 to 65%, with grade 1 DD. He returned in October 2019 with worsening dyspnea.  Myoview was repeated on 07/16/2018 which showed horizontal ST depression in inferior leads, hypertensive response, normal perfusion, EF 59%.   Patient  presented to the office on 11/16/18 for cardiology office visit.  Despite normal perfusion seen on the previous stress test, he continued to have significant left-sided chest pain with exertion.  This was becoming more frequent and occurs with minimal exertion.  The chest pain did not occur at rest.  His symptoms were concerning for progressive class III angina. The case was discussed with DOD Dr. Ellyn Hack who agreed for the patient to undergo cardiac catheterization.  Hospital Course     Underwent cardiac catheterization with Dr. Martinique noted above with single vessel CAD 95%in the OM2 with PCI/DES x1. Patent stents in the LAD and RCA with normal LV function/LVEDP. Plan for DAPT with ASA/plavix for at least one year. Seen by cardiac rehab while in short stay. No complications noted. Instructions/precautions given prior to discharge.    [name] was seen by Dr. Martinique and determined stable for discharge home. Follow up in the office has been arranged. Medications are listed below.   _____________  Discharge Vitals Blood pressure (!) 141/70, pulse (!) 55, temperature 98.1 F (36.7 C), temperature source Oral, resp. rate 14, height 5\' 9"  (1.753 m), weight 104.3 kg, SpO2 99 %.  Filed Weights   11/23/18 0920  Weight: 104.3 kg    Labs & Radiologic Studies    CBC No results for input(s): WBC, NEUTROABS, HGB, HCT, MCV, PLT in the last 72 hours. Basic Metabolic Panel No results for input(s): NA, K, CL, CO2, GLUCOSE, BUN, CREATININE, CALCIUM, MG, PHOS in the last 72 hours. Liver Function  Tests No results for input(s): AST, ALT, ALKPHOS, BILITOT, PROT, ALBUMIN in the last 72 hours. No results for input(s): LIPASE, AMYLASE in the last 72 hours. Cardiac Enzymes No results for input(s): CKTOTAL, CKMB, CKMBINDEX, TROPONINI in the last 72 hours. BNP Invalid input(s): POCBNP D-Dimer No results for input(s): DDIMER in the last 72 hours. Hemoglobin A1C No results for input(s): HGBA1C in the last  72 hours. Fasting Lipid Panel No results for input(s): CHOL, HDL, LDLCALC, TRIG, CHOLHDL, LDLDIRECT in the last 72 hours. Thyroid Function Tests No results for input(s): TSH, T4TOTAL, T3FREE, THYROIDAB in the last 72 hours.  Invalid input(s): FREET3 _____________  No results found. Disposition   Pt is being discharged home today in good condition.  Follow-up Plans & Appointments    Follow-up Information    Almyra Deforest, [place] Follow up on 12/16/2018.   Specialties:  Cardiology, Radiology Why:  at 8am for your follow up appt. Contact information: [address] [place] [address] [phone]          Discharge Instructions    Amb Referral to Cardiac Rehabilitation   Complete by:  As directed    Diagnosis:  Coronary Stents       Discharge Medications     Medication List    STOP taking these medications   ibuprofen 200 MG tablet Commonly known as:  ADVIL,MOTRIN   IBUPROFEN PM 200-38 MG Tabs Generic drug:  Ibuprofen-diphenhydrAMINE Cit   metoprolol tartrate 25 MG tablet Commonly known as:  LOPRESSOR   testosterone cypionate 200 MG/ML injection Commonly known as:  DEPOTESTOSTERONE CYPIONATE     TAKE these medications   amLODipine 5 MG tablet Commonly known as:  NORVASC Take 5 mg by mouth daily.   aspirin 81 MG tablet Take 81 mg by mouth daily.   buPROPion 300 MG 24 hr tablet Commonly known as:  WELLBUTRIN XL Take 300 mg by mouth daily.   cholecalciferol 25 MCG (1000 UT) tablet Commonly known as:  VITAMIN D3 Take 1,000 Units by mouth daily.   clopidogrel 75 MG tablet Commonly known as:  PLAVIX Take 1 tablet (75 mg total) by mouth daily.   CoQ10 100 MG Caps Take 100 mg by mouth daily.   cyanocobalamin 1000 MCG/ML injection Commonly known as:  (VITAMIN B-12) Inject 1,000 mcg into the muscle every 30 (thirty) days.   ezetimibe 10 MG tablet Commonly known as:  ZETIA Take 10 mg by mouth daily.   fluticasone 0.05 % cream Commonly  known as:  CUTIVATE Apply 1 application topically daily as needed (rash).   GLUCOSAMINE PO Take 1 tablet by mouth daily.   ketoconazole 2 % shampoo Commonly known as:  NIZORAL Apply 1 application topically 3 (three) times a week.   levothyroxine 200 MCG tablet Commonly known as:  SYNTHROID, LEVOTHROID Take 200-300 mcg by mouth See admin instructions. Take 300 mcg in the morning on Sun and Wed. Take 200 mcg in the morning on Mon, Tue, Thurs, Fri, and Sat   nitroGLYCERIN 0.4 MG SL tablet Commonly known as:  NITROSTAT Place 1 tablet (0.4 mg total) under the tongue every 5 (five) minutes as needed for chest pain.   ramipril 10 MG capsule Commonly known as:  ALTACE Take 10 mg by mouth daily.   sildenafil 20 MG tablet Commonly known as:  REVATIO Take 80 mg by mouth daily as needed (ED).   simvastatin 40 MG tablet Commonly known as:  ZOCOR Take 20 mg by mouth daily with supper.  Acute coronary syndrome (MI, NSTEMI, STEMI, etc) this admission?: No.     Outstanding Labs/Studies   N/a  Duration of Discharge Encounter   Greater than 30 minutes including physician time.  Signed, Reino Bellis NP-C 11/23/2018, 3:14 PM

## 2018-11-23 NOTE — Interval H&P Note (Signed)
History and Physical Interval Note:  11/23/2018 11:24 AM  [name]  has presented today for surgery, with the diagnosis of Angina/NH  The various methods of treatment have been discussed with the patient and family. After consideration of risks, benefits and other options for treatment, the patient has consented to  Procedure(s): LEFT HEART CATH AND CORONARY ANGIOGRAPHY (N/A) as a surgical intervention .  The patient's history has been reviewed, patient examined, no change in status, stable for surgery.  I have reviewed the patient's chart and labs.  Questions were answered to the patient's satisfaction.   Cath Lab Visit (complete for each Cath Lab visit)  Clinical Evaluation Leading to the Procedure:   ACS: No.  Non-ACS:    Anginal Classification: CCS III  Anti-ischemic medical therapy: Minimal Therapy (1 class of medications)  Non-Invasive Test Results: Low-risk stress test findings: cardiac mortality <1%/year  Prior CABG: No previous CABG        [name] [facility] 11/23/2018 11:25 AM

## 2018-11-23 NOTE — Progress Notes (Signed)
CARDIAC REHAB PHASE I   Stent education completed with pt. Pt educated on importance of ASA, Plavix, and NTG. Pt given stent card and heart healthy diet. Reviewed restrictions and exercise guidelines. Will refer to CRP II [place].   [id number] Rufina Falco, RN BSN 11/23/2018 1:36 PM

## 2018-11-24 ENCOUNTER — Encounter (HOSPITAL_COMMUNITY): Payer: Self-pay | Admitting: Cardiology

## 2018-11-26 DIAGNOSIS — H5213 Myopia, bilateral: Secondary | ICD-10-CM | POA: Diagnosis not present

## 2018-11-26 DIAGNOSIS — H52203 Unspecified astigmatism, bilateral: Secondary | ICD-10-CM | POA: Diagnosis not present

## 2018-11-26 DIAGNOSIS — H40013 Open angle with borderline findings, low risk, bilateral: Secondary | ICD-10-CM | POA: Diagnosis not present

## 2018-11-26 DIAGNOSIS — H524 Presbyopia: Secondary | ICD-10-CM | POA: Diagnosis not present

## 2018-11-26 DIAGNOSIS — H2513 Age-related nuclear cataract, bilateral: Secondary | ICD-10-CM | POA: Diagnosis not present

## 2018-11-26 DIAGNOSIS — H0288A Meibomian gland dysfunction right eye, upper and lower eyelids: Secondary | ICD-10-CM | POA: Diagnosis not present

## 2018-11-26 DIAGNOSIS — H0288B Meibomian gland dysfunction left eye, upper and lower eyelids: Secondary | ICD-10-CM | POA: Diagnosis not present

## 2018-12-03 ENCOUNTER — Telehealth (HOSPITAL_COMMUNITY): Payer: Self-pay

## 2018-12-03 NOTE — Telephone Encounter (Signed)
Attempted to call patient in regards to Cardiac Rehab - LM on VM 

## 2018-12-03 NOTE — Telephone Encounter (Signed)
Pt insurance is active and benefits verified through Medicare A/B. Co-pay $0.00, DED $198.00/$198.00 met, out of pocket $0.00/$0.00 met, co-insurance 20%. No pre-authorization required. Passport, 12/03/2018 @ 11:09AM, REF# [id number]  2ndary insurance is active and benefits verified through [employer].. Co-pay $0.00, DED $0.00/$0.00 met, out of pocket $0.00/$0.00 met, co-insurance 0%. No pre-authorization required.

## 2018-12-03 NOTE — Telephone Encounter (Signed)
Pt returned CR phone call, patient will come in for orientation on 12/23/2018 @ 815AM and will attend the 645AM exercise class. Went over insurance, patient verbalized understanding.  Mailed homework package.

## 2018-12-09 DIAGNOSIS — E538 Deficiency of other specified B group vitamins: Secondary | ICD-10-CM | POA: Diagnosis not present

## 2018-12-14 ENCOUNTER — Other Ambulatory Visit: Payer: Self-pay | Admitting: *Deleted

## 2018-12-14 NOTE — Telephone Encounter (Signed)
Called and spoke with pt in regards to CR, adv pt our department is closed due to the COVID-19 for the next 4 weeks. And we will contact him once we resume scheduling. Pt verbalized understanding.

## 2018-12-15 ENCOUNTER — Telehealth: Payer: Self-pay | Admitting: Physician Assistant

## 2018-12-15 MED ORDER — EZETIMIBE 10 MG PO TABS: 10.0000 mg | ORAL_TABLET | Freq: Every day | ORAL | 3 refills | Status: DC

## 2018-12-15 MED ORDER — CLOPIDOGREL BISULFATE 75 MG PO TABS: 75.0000 mg | ORAL_TABLET | Freq: Every day | ORAL | 0 refills | Status: DC

## 2018-12-15 MED ORDER — CLOPIDOGREL BISULFATE 75 MG PO TABS: 75.0000 mg | ORAL_TABLET | Freq: Every day | ORAL | 3 refills | Status: DC

## 2018-12-15 MED ORDER — RAMIPRIL 10 MG PO CAPS: 10.0000 mg | ORAL_CAPSULE | Freq: Every day | ORAL | 3 refills | Status: DC

## 2018-12-15 NOTE — Addendum Note (Signed)
Addended byEulas Post, Efton Thomley on: 12/15/2018 05:31 PM   Modules accepted: Orders

## 2018-12-15 NOTE — Telephone Encounter (Signed)
I called the patient to see if appointment can be rescheduled and change to evisit, he did not answer, left message that I will attempt to reach out to him again later

## 2018-12-15 NOTE — Telephone Encounter (Signed)
Patient is doing well after recent stent placement. No chest pain or shortness of breath. I refilled his plavix, Zetia and Ramipril. Will cancel tomorrow's appointment and reach out to the patient to set up televisit or evisit for next week.   Ellison Carwin, can you set it up?

## 2018-12-15 NOTE — Telephone Encounter (Signed)
Follow up  ° ° °Patient is returning your call. °

## 2018-12-16 ENCOUNTER — Ambulatory Visit: Payer: Medicare Other | Admitting: Physician Assistant

## 2018-12-16 ENCOUNTER — Telehealth: Payer: Self-pay

## 2018-12-16 NOTE — Telephone Encounter (Signed)
Left voice message for the patient to call back to set up an e-visit

## 2018-12-16 NOTE — Telephone Encounter (Signed)
Left voice message for the patient to schedule an e-visit or virtual visit with Almyra Deforest, PA-C

## 2018-12-21 ENCOUNTER — Telehealth: Payer: Self-pay

## 2018-12-21 NOTE — Telephone Encounter (Signed)
Virtual Visit Pre-Appointment Phone Call  Steps For Call:  1. Confirm consent - "In the setting of the current Covid19 crisis, you are scheduled for a (phone or video) visit with your provider on (date) at (time).  Just as we do with many in-office visits, in order for you to participate in this visit, we must obtain consent.  If you'd like, I can send this to your mychart (if signed up) or email for you to review.  Otherwise, I can obtain your verbal consent now.  All virtual visits are billed to your insurance company just like a normal visit would be.  By agreeing to a virtual visit, we'd like you to understand that the technology does not allow for your provider to perform an examination, and thus may limit your provider's ability to fully assess your condition.  Finally, though the technology is pretty good, we cannot assure that it will always work on either your or our end, and in the setting of a video visit, we may have to convert it to a phone-only visit.  In either situation, we cannot ensure that we have a secure connection.  Are you willing to proceed?"  2. Give patient instructions for WebEx download to smartphone as below if video visit  3. Advise patient to be prepared with any vital sign or heart rhythm information, their current medicines, and a piece of paper and pen handy for any instructions they may receive the day of their visit  4. Inform patient they will receive a phone call 15 minutes prior to their appointment time (may be from unknown caller ID) so they should be prepared to answer  5. Confirm that appointment type is correct in Epic appointment notes (video vs telephone)    TELEPHONE CALL NOTE  [name] has been deemed a candidate for a follow-up tele-health visit to limit community exposure during the Covid-19 pandemic. I spoke with the patient via phone to ensure availability of phone/video source, confirm preferred email & phone number, and discuss  instructions and expectations.  I reminded [name] to be prepared with any vital sign and/or heart rhythm information that could potentially be obtained via home monitoring, at the time of his visit. I reminded [name] to expect a phone call at the time of his visit if his visit.  Did the patient verbally acknowledge consent to treatment? Yes  [name], [facility] 12/21/2018 6:38 PM   DOWNLOADING THE [place]  - If Apple, go to [employer] and type in WebEx in the search bar. [place] [employer], the blue/green circle. The app is free but as with any other app downloads, their phone may require them to verify saved payment information or Apple password. The patient does NOT have to create an account.  - If Android, ask patient to go to [employer] and type in WebEx in the search bar. [place] [employer], the blue/green circle. The app is free but as with any other app downloads, their phone may require them to verify saved payment information or Android password. The patient does NOT have to create an account.   CONSENT FOR TELE-HEALTH VISIT - PLEASE REVIEW  I hereby voluntarily request, consent and authorize [facility] and its employed or contracted physicians, physician assistants, nurse practitioners or other licensed health care professionals (the Practitioner), to provide me with telemedicine health care services (the "Services") as deemed necessary by the treating Practitioner. I  acknowledge and consent to receive the Services by the Practitioner via telemedicine. I understand that the telemedicine visit will involve communicating with the Practitioner through live audiovisual communication technology and the disclosure of certain medical information by electronic transmission. I acknowledge that I have been given the opportunity to request an in-person assessment or other available alternative prior to the telemedicine visit and am  voluntarily participating in the telemedicine visit.  I understand that I have the right to withhold or withdraw my consent to the use of telemedicine in the course of my care at any time, without affecting my right to future care or treatment, and that the Practitioner or I may terminate the telemedicine visit at any time. I understand that I have the right to inspect all information obtained and/or recorded in the course of the telemedicine visit and may receive copies of available information for a reasonable fee.  I understand that some of the potential risks of receiving the Services via telemedicine include:  [name] Delay or interruption in medical evaluation due to technological equipment failure or disruption; . Information transmitted may not be sufficient (e.g. poor resolution of images) to allow for appropriate medical decision making by the Practitioner; and/or  . In rare instances, security protocols could fail, causing a breach of personal health information.  Furthermore, I acknowledge that it is my responsibility to provide information about my medical history, conditions and care that is complete and accurate to the best of my ability. I acknowledge that Practitioner's advice, recommendations, and/or decision may be based on factors not within their control, such as incomplete or inaccurate data provided by me or distortions of diagnostic images or specimens that may result from electronic transmissions. I understand that the practice of medicine is not an exact science and that Practitioner makes no warranties or guarantees regarding treatment outcomes. I acknowledge that I will receive a copy of this consent concurrently upon execution via email to the email address I last provided but may also request a printed copy by calling the office of [place].    I understand that my insurance will be billed for this visit.   I have read or had this consent read to me. . I understand the  contents of this consent, which adequately explains the benefits and risks of the Services being provided via telemedicine.  . I have been provided ample opportunity to ask questions regarding this consent and the Services and have had my questions answered to my satisfaction. . I give my informed consent for the services to be provided through the use of telemedicine in my medical care  By participating in this telemedicine visit I agree to the above.

## 2018-12-22 ENCOUNTER — Telehealth (INDEPENDENT_AMBULATORY_CARE_PROVIDER_SITE_OTHER): Payer: Medicare Other | Admitting: Physician Assistant

## 2018-12-22 DIAGNOSIS — E039 Hypothyroidism, unspecified: Secondary | ICD-10-CM | POA: Diagnosis not present

## 2018-12-22 DIAGNOSIS — E785 Hyperlipidemia, unspecified: Secondary | ICD-10-CM

## 2018-12-22 DIAGNOSIS — I2510 Atherosclerotic heart disease of native coronary artery without angina pectoris: Secondary | ICD-10-CM

## 2018-12-22 DIAGNOSIS — I10 Essential (primary) hypertension: Secondary | ICD-10-CM

## 2018-12-22 DIAGNOSIS — G4733 Obstructive sleep apnea (adult) (pediatric): Secondary | ICD-10-CM

## 2018-12-22 DIAGNOSIS — I119 Hypertensive heart disease without heart failure: Secondary | ICD-10-CM | POA: Diagnosis not present

## 2018-12-22 DIAGNOSIS — Z9989 Dependence on other enabling machines and devices: Secondary | ICD-10-CM | POA: Diagnosis not present

## 2018-12-22 DIAGNOSIS — R001 Bradycardia, unspecified: Secondary | ICD-10-CM | POA: Diagnosis not present

## 2018-12-22 MED ORDER — AMLODIPINE BESYLATE 10 MG PO TABS: 10.0000 mg | ORAL_TABLET | Freq: Every day | ORAL | 3 refills | Status: DC

## 2018-12-22 NOTE — Patient Instructions (Signed)
Medication Instructions:  INCREASE Amlodipine to 10 Mg daily   If you need a refill on your cardiac medications before your next appointment, please call your pharmacy.   Lab work: NONE  If you have labs (blood work) drawn today and your tests are completely normal, you will receive your results only by: [name] MyChart Message (if you have MyChart) OR . A paper copy in the mail If you have any lab test that is abnormal or we need to change your treatment, we will call you to review the results.  Testing/Procedures: NONE  Follow-Up: At [facility], you and your health needs are our priority.  As part of our continuing mission to provide you with exceptional heart care, we have created designated Provider Care Teams.  These Care Teams include your primary Cardiologist (physician) and Advanced Practice Providers (APPs -  Physician Assistants and Nurse Practitioners) who all work together to provide you with the care you need, when you need it. You will need a follow up appointment in 3-4 months.  Please call our office 2 months in advance to schedule this appointment.  You may see Peter Martinique, MD or one of the following Advanced Practice Providers on your designated Care Team: [place], [place] . Fabian Sharp, PA-C  Any Other Special Instructions Will Be Listed Below (If Applicable

## 2018-12-22 NOTE — Progress Notes (Signed)
Virtual Visit via Telephone Note    Evaluation Performed:  Follow-up visit  This visit type was conducted due to national recommendations for restrictions regarding the COVID-19 Pandemic (e.g. social distancing).  This format is felt to be most appropriate for this patient at this time.  All issues noted in this document were discussed and addressed.  No physical exam was performed (except for noted visual exam findings with Video Visits).  Please refer to the patient's chart (MyChart message for video visits and phone note for telephone visits) for the patient's consent to telehealth for [facility].  Date:  12/22/2018   ID:  [name], DOB Jan 04, 1951, MRN [id number]  Patient Location:  Home  Provider location:   [place]  PCP:  Crist Infante, MD  Cardiologist:  Peter Martinique, MD  Electrophysiologist:  None   Chief Complaint:  Post PCI followup  History of Present Illness:    [name] is a 68 y.o. male who presents via audio/video conferencing for a telehealth visit today.    The patient does not have symptoms concerning for COVID-19 infection (fever, chills, cough, or new shortness of breath).    Prior CV studies:   The following studies were reviewed today:  [name] is a 68 y.o. male with past medical history of HTN, HLD, CAD, OSA on CPAP,  hypothyroidism and benign paroxysmal positional vertigo.  Patient had stent to mid LAD and RCA in 2004.  Last cardiac catheterization was performed on 06/2010 which showed nonobstructive disease, patent stent.  He was seen by Fabian Sharp in August 2019 for dyspnea.  Echocardiogram obtained on 04/28/2018 demonstrated normal EF 60 to 65%, with grade 1 DD.  He returned in October 2019 with worsening dyspnea.  Myoview was repeated on 07/16/2018 which showed horizontal ST depression in inferior leads, hypertensive response, normal perfusion, EF 59%.   I last saw the patient on 11/16/2018, at which time he continued to have  significant left-sided chest pain with exertion.  Symptom was concerning for unstable angina.  I arrange for the patient to proceed with cardiac catheterization which was performed on 11/23/2018, this revealed a 95% ostial OM 2 lesion treated with resolute Onyx 2.5 x 12 mm DES, 15% proximal to mid RCA residual, widely patent mid LAD stent.  Patient was initially contacted through [email or web address] for telehealth visit, however due to LDL issues, this was transitioned to telephone visit instead.  Most recent blood pressure listed below.  His blood pressure remains elevated, heart rate borderline low.  I will able to add a beta-blocker due to relative bradycardia.  He is on ramipril and amlodipine, I will increase amlodipine to 10 mg daily.  Overall he is doing well and to continue to exercise.  Dyspnea on exertion he has experienced for the past year has completely resolved.  I suspect the previous stress test in October 2019 was a false negative stress test.  Therefore if he was to have recurrent symptoms in the future, I would be in favor of going directly to cardiac catheterization.  He does not have any lower extremity edema, orthopnea or PND.  He is doing well from cardiology perspective and can follow-up in 3 to 4 months.    Past Medical History:  Diagnosis Date  . Benign paroxysmal positional vertigo   . Coronary artery disease 2004   with prior stenting of the mid LAD and mid right coronary  / cardiac cath with satifsfactory finding following abnormal stress test  .  Depressive disorder, not elsewhere classified   . Gastritis   . Gluten enteropathy   . Heart palpitations    went to ER on 04/27/2006 for palpitations  . Hypercholesteremia   . Hypertension   . Hypothyroidism   . Iron deficiency anemia, unspecified   . Myalgia and myositis, unspecified   . Personal history of colonic polyps 10/2006   hyperplastic polyp  . SOB (shortness of breath)    Past Surgical History:  Procedure Laterality Date  .  CARDIAC CATHETERIZATION  08/25/2003   EF estimated 65% / stenting of the mid left  anterior descending and mid right coronary artery in 2004 with drug-eluting stent/Two vessel obstructive atherosclerotic coronary artery disease. / Normal left ventricular function. / Successful stenting of the mid right coronary artery and the mid left anterior descending  . CARDIAC CATHETERIZATION  07/17/2010   EF estimated at 65% / Left ventricular angiography was performed in the RAO view  This demonstrates normal left ventricular size and contractility with normal  systolic function / Nonobstructive atherosclerotic coronary disease with continued  excellent patency of the stents in the mid LAD & mid right coronary artery / Normal left ventricular function  . COLONOSCOPY    . CORONARY STENT INTERVENTION N/A 11/23/2018   Procedure: CORONARY STENT INTERVENTION;  Surgeon: Martinique, Peter M, MD;  Location: [facility];  Service: Cardiovascular;  Laterality: N/A;  . LEFT HEART CATH AND CORONARY ANGIOGRAPHY N/A 11/23/2018   Procedure: LEFT HEART CATH AND CORONARY ANGIOGRAPHY;  Surgeon: Martinique, Peter M, MD;  Location: [place] [facility];  Service: Cardiovascular;  Laterality: N/A;  . OSTEOTOMY     removal of bone spur on (L) arm  . POLYPECTOMY       No outpatient medications have been marked as taking for the 12/22/18 encounter (Telemedicine) with Almyra Deforest, PA.     Allergies:   Patient has no known allergies.   Social History   Tobacco Use  . Smoking status: Former Smoker    Packs/day: 1.00    Types: Cigarettes    Last attempt to quit: 09/22/2000    Years since quitting: 18.2  . Smokeless tobacco: Never Used  Substance Use Topics  . Alcohol use: Yes    Alcohol/week: 7.0 standard drinks    Types: 7 Glasses of wine per week  . Drug use: No     Family Hx: The patient's family history includes Coronary artery disease in his brother and brother; Diabetes in his brother; Heart attack in his father; Pancreatic  cancer in his mother; Prostate cancer in his father and paternal uncle; Stroke in his brother.  ROS:   Please see the history of present illness.     All other systems reviewed and are negative.   Labs/Other Tests and Data Reviewed:    Recent Labs: 11/17/2018: BUN 15; Creatinine, Ser 0.89; Hemoglobin 13.2; Platelets 285; Potassium 4.0; Sodium 138   Recent Lipid Panel No results found for: CHOL, TRIG, HDL, CHOLHDL, LDLCALC, LDLDIRECT  Wt Readings from Last 3 Encounters:  11/23/18 230 lb (104.3 kg)  11/16/18 234 lb 12.8 oz (106.5 kg)  07/16/18 235 lb (106.6 kg)     Objective:    Vital Signs:  There were no vitals taken for this visit.  Vitals taken at home: 159/87 heart rate 52 151/86 heart rate 52 159/88 heart rate of 57 145/80 heart rate 58 148/88 heart rate 89  Well nourished, well developed male in no acute distress.   ASSESSMENT & PLAN:  1.  CAD  -Emphasized on the compliance of aspirin and Plavix.  -He is able to exercise without further chest discomfort or exertional dyspnea.    - Continue high-dose statin follow-up in 3 to 4 months  2.  Hypertension: Blood pressure elevated, increase amlodipine to 10 mg daily.  Continue ramipril  3. Hyperlipidemia: Continue Zocor 20 mg daily.  Annual lipid panel followed by primary care provider  4. Obstructive sleep apnea on CPAP: Stable  5. Hypothyroidism: On Synthroid managed by primary care provider  6. relative bradycardia: Asymptomatic, not on beta-blocker   COVID-19 Education: The signs and symptoms of COVID-19 were discussed with the patient and how to seek care for testing (follow up with PCP or arrange E-visit).  The importance of social distancing was discussed today.  Patient Risk:   After full review of this patient's clinical status, I feel that they are at least moderate risk at this time.  Time:   Today, I have spent 13 minutes with the patient with telehealth technology discussing cardiac care and  recent cath.     Medication Adjustments/Labs and Tests Ordered: Current medicines are reviewed at length with the patient today.  Concerns regarding medicines are outlined above.  Tests Ordered: No orders of the defined types were placed in this encounter.  Medication Changes: Meds ordered this encounter  Medications  . amLODipine (NORVASC) 10 MG tablet    Sig: Take 1 tablet (10 mg total) by mouth daily.    Dispense:  90 tablet    Refill:  3    Disposition:  Follow up in 3 month(s)  Signed, Almyra Deforest, PA  12/22/2018 2:01 PM    [facility]

## 2018-12-23 ENCOUNTER — Ambulatory Visit (HOSPITAL_COMMUNITY): Payer: Medicare Other

## 2018-12-27 ENCOUNTER — Ambulatory Visit (HOSPITAL_COMMUNITY): Payer: Medicare Other

## 2018-12-29 ENCOUNTER — Ambulatory Visit (HOSPITAL_COMMUNITY): Payer: Medicare Other

## 2018-12-30 DIAGNOSIS — E538 Deficiency of other specified B group vitamins: Secondary | ICD-10-CM | POA: Diagnosis not present

## 2018-12-30 DIAGNOSIS — R809 Proteinuria, unspecified: Secondary | ICD-10-CM | POA: Diagnosis not present

## 2018-12-30 DIAGNOSIS — R7301 Impaired fasting glucose: Secondary | ICD-10-CM | POA: Diagnosis not present

## 2018-12-30 DIAGNOSIS — E7849 Other hyperlipidemia: Secondary | ICD-10-CM | POA: Diagnosis not present

## 2018-12-30 DIAGNOSIS — R82998 Other abnormal findings in urine: Secondary | ICD-10-CM | POA: Diagnosis not present

## 2018-12-30 DIAGNOSIS — Z125 Encounter for screening for malignant neoplasm of prostate: Secondary | ICD-10-CM | POA: Diagnosis not present

## 2018-12-30 DIAGNOSIS — I10 Essential (primary) hypertension: Secondary | ICD-10-CM | POA: Diagnosis not present

## 2018-12-31 ENCOUNTER — Ambulatory Visit (HOSPITAL_COMMUNITY): Payer: Medicare Other

## 2019-01-03 ENCOUNTER — Ambulatory Visit (HOSPITAL_COMMUNITY): Payer: Medicare Other

## 2019-01-05 ENCOUNTER — Ambulatory Visit (HOSPITAL_COMMUNITY): Payer: Medicare Other

## 2019-01-07 ENCOUNTER — Ambulatory Visit (HOSPITAL_COMMUNITY): Payer: Medicare Other

## 2019-01-10 ENCOUNTER — Ambulatory Visit (HOSPITAL_COMMUNITY): Payer: Medicare Other

## 2019-01-10 DIAGNOSIS — I2510 Atherosclerotic heart disease of native coronary artery without angina pectoris: Secondary | ICD-10-CM | POA: Diagnosis not present

## 2019-01-10 DIAGNOSIS — F329 Major depressive disorder, single episode, unspecified: Secondary | ICD-10-CM | POA: Diagnosis not present

## 2019-01-10 DIAGNOSIS — E669 Obesity, unspecified: Secondary | ICD-10-CM | POA: Diagnosis not present

## 2019-01-10 DIAGNOSIS — M7660 Achilles tendinitis, unspecified leg: Secondary | ICD-10-CM | POA: Insufficient documentation

## 2019-01-10 DIAGNOSIS — I209 Angina pectoris, unspecified: Secondary | ICD-10-CM | POA: Diagnosis not present

## 2019-01-10 DIAGNOSIS — Z1331 Encounter for screening for depression: Secondary | ICD-10-CM | POA: Diagnosis not present

## 2019-01-10 DIAGNOSIS — Z0000 Encounter for general adult medical examination without abnormal findings: Secondary | ICD-10-CM | POA: Diagnosis not present

## 2019-01-10 DIAGNOSIS — E291 Testicular hypofunction: Secondary | ICD-10-CM | POA: Diagnosis not present

## 2019-01-10 DIAGNOSIS — K900 Celiac disease: Secondary | ICD-10-CM | POA: Diagnosis not present

## 2019-01-10 DIAGNOSIS — M25562 Pain in left knee: Secondary | ICD-10-CM | POA: Diagnosis not present

## 2019-01-10 DIAGNOSIS — R809 Proteinuria, unspecified: Secondary | ICD-10-CM | POA: Diagnosis not present

## 2019-01-12 ENCOUNTER — Ambulatory Visit (HOSPITAL_COMMUNITY): Payer: Medicare Other

## 2019-01-12 ENCOUNTER — Telehealth (HOSPITAL_COMMUNITY): Payer: Self-pay | Admitting: *Deleted

## 2019-01-12 DIAGNOSIS — E538 Deficiency of other specified B group vitamins: Secondary | ICD-10-CM | POA: Diagnosis not present

## 2019-01-12 NOTE — Telephone Encounter (Signed)
Returned pt call from message left on voicemail. Advised pt that CR remains closed due to adherence of National recommendation for Covid-19. Pt verbalized understanding.   Pt remains interested in cardiac rehab.  Pt averages about 10,000 steps a day. Pt walks 45-60 minutes a day. Pt has hand weights at home but has not started using them. Provided general guidelines on proper warm up, reps and size weights.  Will send pt handout on hand weights, warm up and cool down.  Pt appreciative of the call and information. Cherre Huger, BSN Cardiac and Training and development officer

## 2019-01-14 ENCOUNTER — Ambulatory Visit (HOSPITAL_COMMUNITY): Payer: Medicare Other

## 2019-01-14 DIAGNOSIS — M7662 Achilles tendinitis, left leg: Secondary | ICD-10-CM | POA: Diagnosis not present

## 2019-01-17 ENCOUNTER — Ambulatory Visit (HOSPITAL_COMMUNITY): Payer: Medicare Other

## 2019-01-19 ENCOUNTER — Ambulatory Visit (HOSPITAL_COMMUNITY): Payer: Medicare Other

## 2019-01-21 ENCOUNTER — Ambulatory Visit (HOSPITAL_COMMUNITY): Payer: Medicare Other

## 2019-01-24 ENCOUNTER — Other Ambulatory Visit: Payer: Self-pay | Admitting: Cardiology

## 2019-01-24 ENCOUNTER — Ambulatory Visit (HOSPITAL_COMMUNITY): Payer: Medicare Other

## 2019-01-26 ENCOUNTER — Ambulatory Visit (HOSPITAL_COMMUNITY): Payer: Medicare Other

## 2019-01-28 ENCOUNTER — Ambulatory Visit (HOSPITAL_COMMUNITY): Payer: Medicare Other

## 2019-01-31 ENCOUNTER — Ambulatory Visit (HOSPITAL_COMMUNITY): Payer: Medicare Other

## 2019-02-02 ENCOUNTER — Ambulatory Visit (HOSPITAL_COMMUNITY): Payer: Medicare Other

## 2019-02-04 ENCOUNTER — Ambulatory Visit (HOSPITAL_COMMUNITY): Payer: Medicare Other

## 2019-02-07 ENCOUNTER — Ambulatory Visit (HOSPITAL_COMMUNITY): Payer: Medicare Other

## 2019-02-09 ENCOUNTER — Ambulatory Visit (HOSPITAL_COMMUNITY): Payer: Medicare Other

## 2019-02-11 ENCOUNTER — Ambulatory Visit (HOSPITAL_COMMUNITY): Payer: Medicare Other

## 2019-02-16 ENCOUNTER — Ambulatory Visit (HOSPITAL_COMMUNITY): Payer: Medicare Other

## 2019-02-17 DIAGNOSIS — E538 Deficiency of other specified B group vitamins: Secondary | ICD-10-CM | POA: Diagnosis not present

## 2019-02-18 ENCOUNTER — Ambulatory Visit (HOSPITAL_COMMUNITY): Payer: Medicare Other

## 2019-02-21 ENCOUNTER — Ambulatory Visit (HOSPITAL_COMMUNITY): Payer: Medicare Other

## 2019-02-23 ENCOUNTER — Ambulatory Visit (HOSPITAL_COMMUNITY): Payer: Medicare Other

## 2019-02-25 ENCOUNTER — Ambulatory Visit (HOSPITAL_COMMUNITY): Payer: Medicare Other

## 2019-02-28 ENCOUNTER — Ambulatory Visit (HOSPITAL_COMMUNITY): Payer: Medicare Other

## 2019-02-28 ENCOUNTER — Telehealth (HOSPITAL_COMMUNITY): Payer: Self-pay | Admitting: *Deleted

## 2019-03-01 ENCOUNTER — Telehealth (HOSPITAL_COMMUNITY): Payer: Self-pay | Admitting: *Deleted

## 2019-03-01 ENCOUNTER — Encounter (HOSPITAL_COMMUNITY): Payer: Self-pay

## 2019-03-01 NOTE — Telephone Encounter (Signed)
         Confirm Consent - In the setting of the current Covid19 crisis, you are scheduled for a phone visit with your Cardiac or Pulmonary team member.  Just as we do with many in-gym visits, in order for you to participate in this visit, we must obtain consent.  If you'd like, I can send this to your mychart (if signed up) or email for you to review.  Otherwise, I can obtain your verbal consent now.  By agreeing to a telephone visit, we'd like you to understand that the technology does not allow for your Cardiac or Pulmonary Rehab team member to perform a physical assessment, and thus may limit their ability to fully assess your ability to perform exercise programs. If your provider identifies any concerns that need to be evaluated in person, we will make arrangements to do so.  Finally, though the technology is pretty good, we cannot assure that it will always work on either your or our end and we cannot ensure that we have a secure connection.  Cardiac and Pulmonary Rehab Telehealth visits and "At Home" cardiac and pulmonary rehab are provided at no cost to you.        Are you willing to proceed?"        STAFF: Did the patient verbally acknowledge consent to telehealth visit? Document YES/NO here: Yes     Barnet Pall RN  Cardiac and Pulmonary Rehab Staff       03/01/19     @  9:48AM

## 2019-03-01 NOTE — Telephone Encounter (Signed)
Pt is interested in participating in Virtual Cardiac Rehab. Pt advised that Virtual Cardiac Rehab is provided at no cost to the patient.  Checklist:  1. Pt has smart device  ie smartphone and/or ipad for downloading an app  Yes 2. Reliable internet/wifi service    NO 3. Understands how to use their smartphone and navigate within an app.  Yes 4.  Reviewed with pt the scheduling process for virtual cardiac rehab.  Pt verbalized understanding.  Mr Munday is having problems with his WiFi at home and requested that he be called back at the end of the week to download the virtual cardiac rehab app.Barnet Pall, RN,BSN 03/01/2019 3:18 PM

## 2019-03-02 ENCOUNTER — Ambulatory Visit (HOSPITAL_COMMUNITY): Payer: Medicare Other

## 2019-03-04 ENCOUNTER — Telehealth (HOSPITAL_COMMUNITY): Payer: Self-pay | Admitting: *Deleted

## 2019-03-04 ENCOUNTER — Other Ambulatory Visit: Payer: Self-pay

## 2019-03-04 ENCOUNTER — Encounter (HOSPITAL_COMMUNITY)
Admission: RE | Admit: 2019-03-04 | Discharge: 2019-03-04 | Disposition: A | Discharge status: Home / Self Care | Payer: Self-pay | Source: Ambulatory Visit | Attending: Cardiology | Admitting: Cardiology

## 2019-03-04 ENCOUNTER — Ambulatory Visit (HOSPITAL_COMMUNITY): Payer: Medicare Other

## 2019-03-04 ENCOUNTER — Encounter (HOSPITAL_COMMUNITY): Payer: Self-pay

## 2019-03-04 ENCOUNTER — Telehealth: Payer: Self-pay

## 2019-03-04 ENCOUNTER — Telehealth (HOSPITAL_COMMUNITY): Payer: Self-pay

## 2019-03-04 DIAGNOSIS — I10 Essential (primary) hypertension: Secondary | ICD-10-CM

## 2019-03-04 NOTE — Telephone Encounter (Signed)
Called patient no answer.LMTC. 

## 2019-03-04 NOTE — Telephone Encounter (Signed)
He is maxed out on beta blocker and amlodipine. Also on ACEi. I would add chlorthalidone 25 mg daily. Needs BMET in 2 weeks  Peter Martinique MD, [facility]

## 2019-03-04 NOTE — Telephone Encounter (Signed)
Spoke to [name] at cardiac rehab patient started virtual rehab today.She is concerned  B/P has been elevated since he had a virtual appointment with Almyra Deforest PA 4/1.B/P today 159/87 pulse 60.B/P has been ranging in 150's/80's pulse 50's.Advised I will send message to [place] for advice.

## 2019-03-04 NOTE — Telephone Encounter (Signed)
Phone call made to Pt to set up his virtual CR application on his smart device. Pt did not answer and a message was left for Pt to return call.

## 2019-03-04 NOTE — Telephone Encounter (Addendum)
Called and spoke to pt regarding Virtual Cardiac Rehab.  Pt  was able to download the [employer] app on their smart device with no issues. Pt set up their account and received the following welcome message -"Welcome to the [place] and Pulmonary Rehabilitation program. We hope that you will find the exercise program beneficial in your recovery process. Our staff is available to assist with any questions/concerns about your exercise routine. Best wishes". Brief orientation provided to with the advisement to watch the "Intro to Rehab" series located under the Resource tab. Pt verbalized understanding. Will continue to follow and monitor pt progress with feedback as needed. [name] documented his blood pressure as 159/87 on the virtual cardiac rehab APP. Heart rate 60. Upon review of [name]'s recent blood pressures his systolic has been in the 234'Z. Will notify Dr Doug Sou office as patient is supposed to be seen for follow up in the near future.Barnet Pall, RN,BSN 03/04/2019 3:43 PM

## 2019-03-07 ENCOUNTER — Ambulatory Visit (HOSPITAL_COMMUNITY): Payer: Medicare Other

## 2019-03-07 ENCOUNTER — Encounter (HOSPITAL_COMMUNITY): Payer: Self-pay

## 2019-03-07 MED ORDER — CHLORTHALIDONE 25 MG PO TABS: 25.0000 mg | ORAL_TABLET | Freq: Every day | ORAL | 6 refills | Status: DC

## 2019-03-07 NOTE — Telephone Encounter (Signed)
Spoke to patient Dr.Jordan's advice given.Advised to have bmet in 2 weeks.Advised to call back if B/P continues to be elevated.

## 2019-03-08 ENCOUNTER — Telehealth (HOSPITAL_COMMUNITY): Payer: Self-pay | Admitting: *Deleted

## 2019-03-08 ENCOUNTER — Telehealth: Payer: Self-pay | Admitting: Cardiology

## 2019-03-08 NOTE — Progress Notes (Signed)
MATTHEWJAMES PETRASEK 68 y.o. male DOB: 07/24/1951 MRN: [id number]      Nutrition Note  No diagnosis found. Past Medical History:  Diagnosis Date  . Benign paroxysmal positional vertigo   . Coronary artery disease 2004   with prior stenting of the mid LAD and mid right coronary  / cardiac cath with satifsfactory finding following abnormal stress test  . Depressive disorder, not elsewhere classified   . Gastritis   . Gluten enteropathy   . Heart palpitations    went to ER on 04/27/2006 for palpitations  . Hypercholesteremia   . Hypertension   . Hypothyroidism   . Iron deficiency anemia, unspecified   . Myalgia and myositis, unspecified   . Personal history of colonic polyps 10/2006   hyperplastic polyp  . SOB (shortness of breath)    Meds reviewed.   Current Outpatient Medications (Endocrine & Metabolic):  .  levothyroxine (SYNTHROID, LEVOTHROID) 200 MCG tablet, Take 200-300 mcg by mouth See admin instructions. Take 300 mcg in the morning on Sun and Wed. Take 200 mcg in the morning on Mon, Tue, Thurs, Fri, and Sat  Current Outpatient Medications (Cardiovascular):  .  amLODipine (NORVASC) 10 MG tablet, Take 1 tablet (10 mg total) by mouth daily. .  chlorthalidone (HYGROTON) 25 MG tablet, Take 1 tablet (25 mg total) by mouth daily. [name]  ezetimibe (ZETIA) 10 MG tablet, Take 1 tablet (10 mg total) by mouth daily. .  nitroGLYCERIN (NITROSTAT) 0.4 MG SL tablet, Place 1 tablet (0.4 mg total) under the tongue every 5 (five) minutes as needed for chest pain. .  ramipril (ALTACE) 10 MG capsule, Take 1 capsule (10 mg total) by mouth daily. .  sildenafil (REVATIO) 20 MG tablet, Take 80 mg by mouth daily as needed (ED).  .  simvastatin (ZOCOR) 40 MG tablet, Take 20 mg by mouth daily with supper.   Current Outpatient Medications (Analgesics):  .  aspirin 81 MG tablet, Take 81 mg by mouth daily.  Current Outpatient Medications (Hematological):  .  clopidogrel (PLAVIX) 75 MG tablet, TAKE 1 TABLET ONCE  DAILY. .  cyanocobalamin (,VITAMIN B-12,) 1000 MCG/ML injection, Inject 1,000 mcg into the muscle every 30 (thirty) days.    Current Outpatient Medications (Other):  [name]  buPROPion (WELLBUTRIN XL) 300 MG 24 hr tablet, Take 300 mg by mouth daily. .  cholecalciferol (VITAMIN D3) 25 MCG (1000 UT) tablet, Take 1,000 Units by mouth daily. .  Coenzyme Q10 (COQ10) 100 MG CAPS, Take 100 mg by mouth daily. .  fluticasone (CUTIVATE) 0.05 % cream, Apply 1 application topically daily as needed (rash).  .  Glucosamine HCl (GLUCOSAMINE PO), Take 1 tablet by mouth daily. [name]  ketoconazole (NIZORAL) 2 % shampoo, Apply 1 application topically 3 (three) times a week.   HT: Ht Readings from Last 1 Encounters:  11/23/18 5\' 9"  (1.753 m)    WT: Wt Readings from Last 5 Encounters:  11/23/18 230 lb (104.3 kg)  11/16/18 234 lb 12.8 oz (106.5 kg)  07/16/18 235 lb (106.6 kg)  07/13/18 235 lb (106.6 kg)  04/28/18 235 lb (106.6 kg)     There is no height or weight on file to calculate BMI.   Current tobacco use? No  Labs:  Lipid Panel  No results found for: CHOL, TRIG, HDL, CHOLHDL, VLDL, LDLCALC, LDLDIRECT  No results found for: HGBA1C CBG (last 3)  No results for input(s): GLUCAP in the last 72 hours.  Nutrition Note Spoke with pt. Nutrition plan and goals reviewed  with pt. Pt is following a heart healthy diet. Pt reports he has a supportive spouse, who has helped with integration of heart healthy eating. Additional heart healthy eating tips reviewed (label reading, how to build a healthy plate, portion sizes, eating frequently across the day). Per discussion, pt does not use canned/convenience foods often. Pt does not add salt to food. Pt does not eat out frequently. Avoids fatty cuts of meat, and fried foods. Pt expressed understanding of the information reviewed.  Nutrition Diagnosis ? Food-and nutrition-related knowledge deficit related to lack of exposure to information as related to diagnosis of: ?  CVD  Nutrition Intervention ? Pt's individual nutrition plan and goals reviewed with pt. ? Pt given handouts for: ? heart healthy eating ? how to build a healthy plate  ? Snacks  Nutrition Goal(s):  ? Pt to identify and limit food sources of saturated fat, trans fat, refined carbohydrates and sodium ? Pt to build a healthy plate including vegetables, fruits, whole grains, and low-fat dairy products in a heart healthy meal plan. ? Pt to weigh and measure serving sizes.  Plan:  ? Will provide client-centered nutrition education as part of interdisciplinary care  Spoke with patient: 56min today Laurina Bustle, MS, RD, LDN 03/08/2019 10:11 AM

## 2019-03-08 NOTE — Telephone Encounter (Signed)
Patient called back returning Cheryl's call

## 2019-03-08 NOTE — Telephone Encounter (Signed)
Returned call to patient at # listed below phone rings busy.

## 2019-03-08 NOTE — Telephone Encounter (Signed)
New Message      Pt c/o medication issue:  1. Name of Medication: Chlorthalidone   2. How are you currently taking this medication (dosage and times per day)? 25mg  1 tablet daily   3. Are you having a reaction (difficulty breathing--STAT)? N/A  4. What is your medication issue?  Maria from the cardiac rehab called due to patient medication making him lightheaded so she wanted to speak to [place] about the issue.

## 2019-03-08 NOTE — Telephone Encounter (Signed)
Returned cal to [name] at cardiac rehab phone rings busy Called patient no answer.[place].

## 2019-03-08 NOTE — Telephone Encounter (Signed)
Follow up     [name] is returning the call and asks to be called back at [phone]

## 2019-03-08 NOTE — Telephone Encounter (Signed)
Spoke to patient he stated he started taking Chlorthalidone 25 mg daily yesterday.Stated he noticed he got alittle light headed this morning that lasted appox 1 hour.Stated B/P this morning was good 124/74,126/76,121/68 pulse 55.Stated he has felt good today.Advised to keep taking as prescribed.Advised to continue to monitor and call back if needed.

## 2019-03-08 NOTE — Telephone Encounter (Signed)
Returned call to patient at # [phone] no answer.[place].

## 2019-03-08 NOTE — Telephone Encounter (Signed)
Spoke with the patient via telephone.Patient reported feeling lightheaded this morning when using exercise bands on the virtual cardiac rehab APP. [name] documented his blood pressure at 125/69 heart rate 55. Had the patient to check his sitting and standing blood pressure at home. Sitting blood pressure 140/78 sitting. Heart rate 56. Standing blood pressure 151/89 heart rate 62. [name] says this is the second day that he has taken the chlorthalidone. Dr Doug Sou office called and notified. [name] has not taken his amlodipine or ramipril today. Medications reviewed via telephone. [name] says he does not feel "bad" but does feel a little lightheaded since he has started his new medication.Barnet Pall, RN,BSN 03/08/2019 10:57 AM

## 2019-03-09 ENCOUNTER — Ambulatory Visit (HOSPITAL_COMMUNITY): Payer: Medicare Other

## 2019-03-11 ENCOUNTER — Ambulatory Visit (HOSPITAL_COMMUNITY): Payer: Medicare Other

## 2019-03-14 ENCOUNTER — Ambulatory Visit (HOSPITAL_COMMUNITY): Payer: Medicare Other

## 2019-03-15 DIAGNOSIS — M7662 Achilles tendinitis, left leg: Secondary | ICD-10-CM | POA: Insufficient documentation

## 2019-03-16 ENCOUNTER — Ambulatory Visit (HOSPITAL_COMMUNITY): Payer: Medicare Other

## 2019-03-16 DIAGNOSIS — M7662 Achilles tendinitis, left leg: Secondary | ICD-10-CM | POA: Diagnosis not present

## 2019-03-18 ENCOUNTER — Ambulatory Visit (HOSPITAL_COMMUNITY): Payer: Medicare Other

## 2019-03-21 ENCOUNTER — Ambulatory Visit (HOSPITAL_COMMUNITY): Payer: Medicare Other

## 2019-03-21 DIAGNOSIS — I10 Essential (primary) hypertension: Secondary | ICD-10-CM | POA: Diagnosis not present

## 2019-03-21 LAB — BASIC METABOLIC PANEL
BUN/Creatinine Ratio: 12 (ref 10–24)
BUN: 10 mg/dL (ref 8–27)
CO2: 26 mmol/L (ref 20–29)
Calcium: 9.6 mg/dL (ref 8.6–10.2)
Chloride: 99 mmol/L (ref 96–106)
Creatinine, Ser: 0.84 mg/dL (ref 0.76–1.27)
GFR calc Af Amer: 105 mL/min/{1.73_m2} (ref >59)
GFR calc non Af Amer: 91 mL/min/{1.73_m2} (ref >59)
Glucose: 87 mg/dL (ref 65–99)
Potassium: 3.9 mmol/L (ref 3.5–5.2)
Sodium: 140 mmol/L (ref 134–144)

## 2019-03-23 ENCOUNTER — Ambulatory Visit (HOSPITAL_COMMUNITY): Payer: Medicare Other

## 2019-03-28 ENCOUNTER — Ambulatory Visit (HOSPITAL_COMMUNITY): Payer: Medicare Other

## 2019-03-30 ENCOUNTER — Ambulatory Visit (HOSPITAL_COMMUNITY): Payer: Medicare Other

## 2019-03-31 DIAGNOSIS — E291 Testicular hypofunction: Secondary | ICD-10-CM | POA: Diagnosis not present

## 2019-03-31 DIAGNOSIS — E538 Deficiency of other specified B group vitamins: Secondary | ICD-10-CM | POA: Diagnosis not present

## 2019-04-06 ENCOUNTER — Telehealth (HOSPITAL_COMMUNITY): Payer: Self-pay

## 2019-04-08 NOTE — Progress Notes (Signed)
[name] Date of Birth: 11-Sep-1951   History of Present Illness: [name] is seen for followup of his coronary disease. Last seen in August 2016.  He does have a history of anemia, low B12, and testosterone levels. He has a history of stenting of the mid LAD and mid right coronary in 2004 with DES. In 2011 he had a nuclear stress test. He was able to exercise for 8 minutes with significant ST segment changes. Perfusion imaging was normal. He did undergo repeat cardiac catheterization which showed nonobstructive disease and continued excellent patency of his stents. Repeat myoview in 2016 still showed ST segment changes but normal perfusion and normal EF.   Followed by Dr. Brett Fairy for evaluation of snoring and REM dependent apnea. Fitted for a dental device.   He was seen by Fabian Sharp in August 2019 for dyspnea. Echocardiogram obtained on 04/28/2018 demonstrated normal EF 60 to 65%, with grade 1 DD. He returned in October 2019 with worsening dyspnea. Myoview was repeated on 07/16/2018 which showed horizontal ST depression in inferior leads, hypertensive response, normal perfusion, EF 59%. He was seen on on 11/16/2018, at which time he continued to have significant left-sided chest pain with exertion.  Symptom was concerning for unstable angina.  He had cardiac catheterization which was performed on 11/23/2018, this revealed a 95% ostial OM 2 lesion treated with resolute Onyx 2.5 x 12 mm DES, 15% proximal to mid RCA residual, widely patent mid LAD stent.  On subsequent follow up was persistently hypertensive. Amlodipine increased and then started on chlorthalidone.   On follow up today he is doing well from a cardiac standpoint. No chest pain or SOB. He has lost 23 lbs with dietary modification. A week ago he developed sudden dizziness with flushing and vertigo. This was bad for a couple of days but then improved. Worse with turning his head. This is getting better.   Current Outpatient  Medications on File Prior to Visit  Medication Sig Dispense Refill  . amLODipine (NORVASC) 10 MG tablet Take 1 tablet (10 mg total) by mouth daily. 90 tablet 3  . aspirin 81 MG tablet Take 81 mg by mouth daily.    [name] buPROPion (WELLBUTRIN XL) 300 MG 24 hr tablet Take 300 mg by mouth daily.    . chlorthalidone (HYGROTON) 25 MG tablet Take 1 tablet (25 mg total) by mouth daily. 30 tablet 6  . cholecalciferol (VITAMIN D3) 25 MCG (1000 UT) tablet Take 1,000 Units by mouth daily.    . clopidogrel (PLAVIX) 75 MG tablet TAKE 1 TABLET ONCE DAILY. 30 tablet 11  . Coenzyme Q10 (COQ10) 100 MG CAPS Take 100 mg by mouth daily.    . cyanocobalamin (,VITAMIN B-12,) 1000 MCG/ML injection Inject 1,000 mcg into the muscle every 30 (thirty) days.      [name] ezetimibe (ZETIA) 10 MG tablet Take 1 tablet (10 mg total) by mouth daily. 90 tablet 3  . fluticasone (CUTIVATE) 0.05 % cream Apply 1 application topically daily as needed (rash).     . Glucosamine HCl (GLUCOSAMINE PO) Take 1 tablet by mouth daily.    [name] ketoconazole (NIZORAL) 2 % shampoo Apply 1 application topically 3 (three) times a week.     . levothyroxine (SYNTHROID, LEVOTHROID) 200 MCG tablet Take 200 mcg by mouth See admin instructions.     . nitroGLYCERIN (NITROSTAT) 0.4 MG SL tablet Place 1 tablet (0.4 mg total) under the tongue every 5 (five) minutes as needed for chest pain. 25 tablet  3  . ramipril (ALTACE) 10 MG capsule Take 1 capsule (10 mg total) by mouth daily. 90 capsule 3  . sildenafil (REVATIO) 20 MG tablet Take 80 mg by mouth daily as needed (ED).     . simvastatin (ZOCOR) 40 MG tablet Take 20 mg by mouth daily with supper.     No current facility-administered medications on file prior to visit.     No Known Allergies  Past Medical History:  Diagnosis Date  . Benign paroxysmal positional vertigo   . Coronary artery disease 2004   with prior stenting of the mid LAD and mid right coronary  / cardiac cath with satifsfactory finding following  abnormal stress test  . Depressive disorder, not elsewhere classified   . Gastritis   . Gluten enteropathy   . Heart palpitations    went to ER on 04/27/2006 for palpitations  . Hypercholesteremia   . Hypertension   . Hypothyroidism   . Iron deficiency anemia, unspecified   . Myalgia and myositis, unspecified   . Personal history of colonic polyps 10/2006   hyperplastic polyp  . SOB (shortness of breath)     Past Surgical History:  Procedure Laterality Date  . CARDIAC CATHETERIZATION  08/25/2003   EF estimated 65% / stenting of the mid left  anterior descending and mid right coronary artery in 2004 with drug-eluting stent/Two vessel obstructive atherosclerotic coronary artery disease. / Normal left ventricular function. / Successful stenting of the mid right coronary artery and the mid left anterior descending  . CARDIAC CATHETERIZATION  07/17/2010   EF estimated at 65% / Left ventricular angiography was performed in the RAO view  This demonstrates normal left ventricular size and contractility with normal  systolic function / Nonobstructive atherosclerotic coronary disease with continued  excellent patency of the stents in the mid LAD & mid right coronary artery / Normal left ventricular function  . COLONOSCOPY    . CORONARY STENT INTERVENTION N/A 11/23/2018   Procedure: CORONARY STENT INTERVENTION;  Surgeon: Martinique, Peter M, MD;  Location: [facility];  Service: Cardiovascular;  Laterality: N/A;  . LEFT HEART CATH AND CORONARY ANGIOGRAPHY N/A 11/23/2018   Procedure: LEFT HEART CATH AND CORONARY ANGIOGRAPHY;  Surgeon: Martinique, Peter M, MD;  Location: [facility];  Service: Cardiovascular;  Laterality: N/A;  . OSTEOTOMY     removal of bone spur on (L) arm  . POLYPECTOMY      Social History   Tobacco Use  Smoking Status Former Smoker  . Packs/day: 1.00  . Types: Cigarettes  . Quit date: 09/22/2000  . Years since quitting: 18.5  Smokeless Tobacco Never Used    Social  History   Substance and Sexual Activity  Alcohol Use Yes  . Alcohol/week: 7.0 standard drinks  . Types: 7 Glasses of wine per week    Family History  Problem Relation Age of Onset  . Heart attack Father        x2  . Prostate cancer Father   . Pancreatic cancer Mother   . Stroke Brother   . Coronary artery disease Brother   . Coronary artery disease Brother   . Prostate cancer Paternal Uncle   . Diabetes Brother     Review of Systems: As noted in history of present illness. All other systems were reviewed and are negative.  Physical Exam: BP 122/68   Pulse 80   Temp 97.7 F (36.5 C)   Ht 5\' 9"  (1.753 m)   Wt 211  lb 9.6 oz (96 kg)   SpO2 97%   BMI 31.25 kg/m  GENERAL:  Well appearing, overweight WM HEENT:  PERRL, EOMI, sclera are clear. Oropharynx is clear. NECK:  No jugular venous distention, carotid upstroke brisk and symmetric, no bruits, no thyromegaly or adenopathy LUNGS:  Clear to auscultation bilaterally CHEST:  Unremarkable HEART:  RRR,  PMI not displaced or sustained,S1 and S2 within normal limits, no S3, no S4: no clicks, no rubs, no murmurs ABD:  Soft, nontender. BS +, no masses or bruits. No hepatomegaly, no splenomegaly EXT:  2 + pulses throughout, left leg with 1-2+ edema, nontender.  no cyanosis no clubbing SKIN:  Warm and dry.  No rashes NEURO:  Alert and oriented x 3. Cranial nerves II through XII intact. PSYCH:  Cognitively intact    LABORATORY DATA: Lab Results  Component Value Date   WBC 5.2 11/17/2018   HGB 13.2 11/17/2018   HCT 40.2 11/17/2018   PLT 285 11/17/2018   GLUCOSE 87 03/21/2019   NA 140 03/21/2019   K 3.9 03/21/2019   CL 99 03/21/2019   CREATININE 0.84 03/21/2019   BUN 10 03/21/2019   CO2 26 03/21/2019   No results found for: CHOL, HDL, LDLCALC, LDLDIRECT, TRIG, CHOLHDL   Dated 08/20/16: cholesterol 146, triglycerides 127, HDL 35, LDL 86. Chemistries, TSH, CBC normal. Dated 12/30/18: cholesterol 128, triglycerides 98, HDL  33, LDL 75. A1c 5.1%. CMET, TSH, CBC normal Dated 02/24/17 A1c 5.2%.   Myoview 05/04/15: Study Highlights    The left ventricular ejection fraction is normal (55-65%).  Nuclear stress EF: 61%.  Blood pressure demonstrated a hypertensive response to exercise.  Horizontal ST segment depression ST segment depression of 2 mm was noted during stress in the III, aVF and II leads.  The study is normal.  This is a low risk study.   Low risk stress nuclear study with normal perfusion and normal left ventricular regional and global systolic function. Abnormal ECG response probably represents a "false positive" study, possibly related to hypertensive response to exercise.   Myoview 07/16/18: Study Highlights   Clinically negative, electrically positive for ischemia  Horizontal ST segment depression ST segment depression was noted during stress in the II, III and aVF leads, beginning at 6 minutes of stress, and returning to baseline after 1-5 minutes of recovery.  Hypertensive response  Normal perfusion.  LVEF 59%  Low risk scan   Echo 04/28/18: Study Conclusions  - Left ventricle: The cavity size was normal. Wall thickness was   normal. Systolic function was normal. The estimated ejection   fraction was in the range of 60% to 65%. Wall motion was normal;   there were no regional wall motion abnormalities. Doppler   parameters are consistent with abnormal left ventricular   relaxation (grade 1 diastolic dysfunction). - Mitral valve: Calcified annulus.  Impressions:  - Normal LV systolic function; mild diastolic dysfunction.  Procedures  CORONARY STENT INTERVENTION  LEFT HEART CATH AND CORONARY ANGIOGRAPHY  Conclusion    Prox RCA to Mid RCA lesion is 15% stenosed.  Previously placed Mid LAD stent (unknown type) is widely patent.  Ost 2nd Mrg lesion is 95% stenosed.  Post intervention, there is a 0% residual stenosis.  A drug-eluting stent was successfully placed  using a STENT RESOLUTE ONYX 2.5X12.  The left ventricular systolic function is normal.  LV end diastolic pressure is normal.  The left ventricular ejection fraction is 55-65% by visual estimate.   1. Single vessel obstructive CAD  with 95% OM 2 lesion. Prior stents in the LAD and RCA are patent.  2. Normal LV function 3. Normal LVEDP 4. Successful PCI of the OM2 with DES  Plan: DAPT for one year. Patient is a candidate for same day discharge.       Assessment / Plan: 1. Coronary disease. Status post stenting of the mid LAD and RCA in 2004 with DES. Cardiac catheterization in 2011 showed patent stents. Myoview in 2016 and November 2019 were normal. Presented with unstable angina in March 2020 with DES of OM branch. On DAPT for one year. He is asymptomatic.   2. HTN- control has improved significantly with weight loss. Will hold chlorthalidone now and monitor on other meds.   3. Hypercholesterolemia. On Zetia and Zocor. ideally goal LDL 70. Last LDL 75. Hopefully with weight loss he is able to achieve goal without further therapy.  4. Hypothyroidism.  5. Vertigo. Fairly classic symptoms. Improving. Offered antivert but he doesn't feel he needs this.   I will follow up in 6 months.

## 2019-04-11 ENCOUNTER — Telehealth: Payer: Medicare Other | Admitting: Physician Assistant

## 2019-04-11 DIAGNOSIS — R42 Dizziness and giddiness: Secondary | ICD-10-CM

## 2019-04-11 DIAGNOSIS — R61 Generalized hyperhidrosis: Secondary | ICD-10-CM

## 2019-04-11 DIAGNOSIS — G4489 Other headache syndrome: Secondary | ICD-10-CM

## 2019-04-11 NOTE — Progress Notes (Signed)
Based on what you shared with me, I feel your condition warrants further evaluation and I recommend that you be seen for a face to face office visit.  NOTE: If you entered your credit card information for this eVisit, you will not be charged. You may see a "hold" on your card for the $35 but that hold will drop off and you will not have a charge processed.   [name],  [name] stated you recently started taking a new BP medicine- your symptoms may be related to low blood pressure, which would be considered an emergency, or may be due to side effect of your medications. Please have a face to face evaluation of your symptoms. Hypotension or low BP can be life threatening.   If you are having a true medical emergency please call 911.     For an urgent face to face visit, [name] has five urgent care centers for your convenience:    [email or web address] to reserve your spot online an avoid wait times  [name] (New Address!) [address] [place], [place] [address] *Just off [address], across the road from [place] hours of operation: Monday-Friday, 12 PM to 6 PM  Closed Saturday & Sunday   The following sites will take your insurance:  . [facility]    [phone]                  Get Driving Directions  [address], [place] [address] . 10 am to 8 pm Monday-Friday . 12 pm to 8 pm Saturday-Sunday   . [facility] at [place]                  Get Driving Directions  [address], [place], [place] [address] . 8 am to 8 pm Monday-Friday . 9 am to 6 pm Saturday . 11 am to 6 pm Sunday   . [facility] at [place]                  Get Driving Directions   [address] [place], [place] [address] . 8 am to 8 pm Monday-Friday . 8 am to 4 pm Saturday-Sunday    . [facility]  at                     Get Driving Directions  [phone]  [address], [place], [place] [address]  . Monday-Friday, 12 PM to 6 PM    Your e-visit answers were reviewed by a board certified advanced clinical practitioner to complete your personal care plan.  Thank you for using e-Visits. I spent 5-10 minutes on review and completion of this note- Lacy Duverney [facility]

## 2019-04-14 ENCOUNTER — Telehealth: Payer: Self-pay | Admitting: Cardiology

## 2019-04-14 ENCOUNTER — Ambulatory Visit (INDEPENDENT_AMBULATORY_CARE_PROVIDER_SITE_OTHER): Payer: Medicare Other | Admitting: Cardiology

## 2019-04-14 ENCOUNTER — Encounter: Payer: Self-pay | Admitting: Cardiology

## 2019-04-14 ENCOUNTER — Other Ambulatory Visit: Payer: Self-pay

## 2019-04-14 VITALS — BP 122/68 | HR 80 | Temp 97.7°F | Ht 69.0 in | Wt 211.6 lb

## 2019-04-14 DIAGNOSIS — E785 Hyperlipidemia, unspecified: Secondary | ICD-10-CM

## 2019-04-14 DIAGNOSIS — I2510 Atherosclerotic heart disease of native coronary artery without angina pectoris: Secondary | ICD-10-CM

## 2019-04-14 DIAGNOSIS — I200 Unstable angina: Secondary | ICD-10-CM

## 2019-04-14 DIAGNOSIS — I10 Essential (primary) hypertension: Secondary | ICD-10-CM | POA: Diagnosis not present

## 2019-04-14 NOTE — Telephone Encounter (Signed)
I left a message fpor pt to call back to be pre=screened for his visit today(04-14-19).

## 2019-04-14 NOTE — Telephone Encounter (Signed)

## 2019-04-14 NOTE — Patient Instructions (Signed)
Stop chlorthalidone  Monitor BP   Follow up in 6 months

## 2019-04-27 DIAGNOSIS — E538 Deficiency of other specified B group vitamins: Secondary | ICD-10-CM | POA: Diagnosis not present

## 2019-04-27 DIAGNOSIS — E291 Testicular hypofunction: Secondary | ICD-10-CM | POA: Diagnosis not present

## 2019-05-03 ENCOUNTER — Encounter (HOSPITAL_COMMUNITY): Payer: Self-pay

## 2019-05-03 NOTE — Progress Notes (Signed)
Letter sent to Pt due to inactivity with virtual CR app. Pt has not been logging exercise. If Pt does not respond by 05/17/19 will be dropped from the virtual CR app.   Deitra Mayo BS, ACSM CEP 05/03/2019 9:20 AM

## 2019-05-13 DIAGNOSIS — F329 Major depressive disorder, single episode, unspecified: Secondary | ICD-10-CM | POA: Diagnosis not present

## 2019-05-13 DIAGNOSIS — R7301 Impaired fasting glucose: Secondary | ICD-10-CM | POA: Diagnosis not present

## 2019-05-13 DIAGNOSIS — I10 Essential (primary) hypertension: Secondary | ICD-10-CM | POA: Diagnosis not present

## 2019-05-13 DIAGNOSIS — I209 Angina pectoris, unspecified: Secondary | ICD-10-CM | POA: Diagnosis not present

## 2019-05-13 DIAGNOSIS — E538 Deficiency of other specified B group vitamins: Secondary | ICD-10-CM | POA: Diagnosis not present

## 2019-05-13 DIAGNOSIS — E785 Hyperlipidemia, unspecified: Secondary | ICD-10-CM | POA: Diagnosis not present

## 2019-05-13 DIAGNOSIS — E291 Testicular hypofunction: Secondary | ICD-10-CM | POA: Diagnosis not present

## 2019-05-13 DIAGNOSIS — K900 Celiac disease: Secondary | ICD-10-CM | POA: Diagnosis not present

## 2019-05-13 DIAGNOSIS — E039 Hypothyroidism, unspecified: Secondary | ICD-10-CM | POA: Diagnosis not present

## 2019-05-13 DIAGNOSIS — M7660 Achilles tendinitis, unspecified leg: Secondary | ICD-10-CM | POA: Diagnosis not present

## 2019-05-18 DIAGNOSIS — E538 Deficiency of other specified B group vitamins: Secondary | ICD-10-CM | POA: Diagnosis not present

## 2019-05-18 DIAGNOSIS — E038 Other specified hypothyroidism: Secondary | ICD-10-CM | POA: Diagnosis not present

## 2019-05-18 DIAGNOSIS — E291 Testicular hypofunction: Secondary | ICD-10-CM | POA: Diagnosis not present

## 2019-05-18 DIAGNOSIS — Z23 Encounter for immunization: Secondary | ICD-10-CM | POA: Diagnosis not present

## 2019-06-06 ENCOUNTER — Other Ambulatory Visit: Payer: Self-pay | Admitting: *Deleted

## 2019-06-06 DIAGNOSIS — Z20822 Contact with and (suspected) exposure to covid-19: Secondary | ICD-10-CM

## 2019-06-07 LAB — NOVEL CORONAVIRUS, NAA: SARS-CoV-2, NAA: NOT DETECTED

## 2019-06-22 DIAGNOSIS — E291 Testicular hypofunction: Secondary | ICD-10-CM | POA: Diagnosis not present

## 2019-06-22 DIAGNOSIS — E538 Deficiency of other specified B group vitamins: Secondary | ICD-10-CM | POA: Diagnosis not present

## 2019-07-19 DIAGNOSIS — E538 Deficiency of other specified B group vitamins: Secondary | ICD-10-CM | POA: Diagnosis not present

## 2019-08-24 DIAGNOSIS — E538 Deficiency of other specified B group vitamins: Secondary | ICD-10-CM | POA: Diagnosis not present

## 2019-09-13 ENCOUNTER — Ambulatory Visit: Payer: Medicare Other | Attending: Internal Medicine

## 2019-09-13 DIAGNOSIS — Z20822 Contact with and (suspected) exposure to covid-19: Secondary | ICD-10-CM

## 2019-09-14 DIAGNOSIS — M25579 Pain in unspecified ankle and joints of unspecified foot: Secondary | ICD-10-CM | POA: Insufficient documentation

## 2019-09-15 LAB — NOVEL CORONAVIRUS, NAA: SARS-CoV-2, NAA: NOT DETECTED

## 2019-10-04 NOTE — Progress Notes (Signed)
[name] Date of Birth: [birth year]   History of Present Illness: [name] is seen for followup of his coronary disease. He has a history of stenting of the mid LAD and mid right coronary in 2004 with DES. In 2011 he had a nuclear stress test. He was able to exercise for 8 minutes with significant ST segment changes. Perfusion imaging was normal. He did undergo repeat cardiac catheterization which showed nonobstructive disease and continued excellent patency of his stents. Repeat myoview in 2016 still showed ST segment changes but normal perfusion and normal EF.   Followed by Dr. Brett Fairy for evaluation of snoring and REM dependent apnea. Fitted for a dental device.   He was seen by Fabian Sharp in August 2019 for dyspnea. Echocardiogram obtained on 04/28/2018 demonstrated normal EF 60 to 65%, with grade 1 DD. He returned in October 2019 with worsening dyspnea. Myoview was repeated on 07/16/2018 which showed horizontal ST depression in inferior leads, hypertensive response, normal perfusion, EF 59%. He was seen on on 11/16/2018, at which time he continued to have significant left-sided chest pain with exertion.  Symptom was concerning for unstable angina.  He had cardiac catheterization which was performed on 11/23/2018, this revealed a 95% ostial OM 2 lesion treated with resolute Onyx 2.5 x 12 mm DES, 15% proximal to mid RCA residual, widely patent mid LAD stent.  On subsequent follow up was persistently hypertensive. Amlodipine increased and then started on chlorthalidone.   On follow up today he is doing well from a cardiac standpoint. No chest pain or SOB. His weight is stable. He is walking some. He did stop Simvastatin 2 months ago due to myalgias. Similar problems to other statins. Started on Repatha with excellent results.   Current Outpatient Medications on File Prior to Visit  Medication Sig Dispense Refill  . aspirin 81 MG tablet Take 81 mg by mouth daily.    [name] buPROPion (WELLBUTRIN  XL) 300 MG 24 hr tablet Take 300 mg by mouth daily.    . cholecalciferol (VITAMIN D3) 25 MCG (1000 UT) tablet Take 1,000 Units by mouth daily.    . clopidogrel (PLAVIX) 75 MG tablet TAKE 1 TABLET ONCE DAILY. 30 tablet 11  . Coenzyme Q10 (COQ10) 100 MG CAPS Take 100 mg by mouth daily.    . cyanocobalamin (,VITAMIN B-12,) 1000 MCG/ML injection Inject 1,000 mcg into the muscle every 30 (thirty) days.      [name] ezetimibe (ZETIA) 10 MG tablet Take 1 tablet (10 mg total) by mouth daily. 90 tablet 3  . fluticasone (CUTIVATE) 0.05 % cream Apply 1 application topically daily as needed (rash).     . Glucosamine HCl (GLUCOSAMINE PO) Take 1 tablet by mouth daily.    [name] ketoconazole (NIZORAL) 2 % shampoo Apply 1 application topically 3 (three) times a week.     . levothyroxine (SYNTHROID, LEVOTHROID) 200 MCG tablet Take 200 mcg by mouth See admin instructions.     . ramipril (ALTACE) 10 MG capsule Take 1 capsule (10 mg total) by mouth daily. 90 capsule 3  . REPATHA SURECLICK XX123456 MG/ML SOAJ Inject 1 Syringe into the skin every 14 (fourteen) days.    . sildenafil (REVATIO) 20 MG tablet Take 80 mg by mouth daily as needed (ED).     [name] testosterone cypionate (DEPOTESTOSTERONE CYPIONATE) 200 MG/ML injection testosterone cypionate 200 mg/mL intramuscular oil    . amLODipine (NORVASC) 10 MG tablet Take 1 tablet (10 mg total) by mouth daily. 90 tablet 3  .  chlorthalidone (HYGROTON) 25 MG tablet Take 1 tablet (25 mg total) by mouth daily. 30 tablet 6  . nitroGLYCERIN (NITROSTAT) 0.4 MG SL tablet Place 1 tablet (0.4 mg total) under the tongue every 5 (five) minutes as needed for chest pain. 25 tablet 3   No current facility-administered medications on file prior to visit.    No Known Allergies  Past Medical History:  Diagnosis Date  . Benign paroxysmal positional vertigo   . Coronary artery disease 2004   with prior stenting of the mid LAD and mid right coronary  / cardiac cath with satifsfactory finding following  abnormal stress test  . Depressive disorder, not elsewhere classified   . Gastritis   . Gluten enteropathy   . Heart palpitations    went to ER on 04/27/2006 for palpitations  . Hypercholesteremia   . Hypertension   . Hypothyroidism   . Iron deficiency anemia, unspecified   . Myalgia and myositis, unspecified   . Personal history of colonic polyps 10/2006   hyperplastic polyp  . SOB (shortness of breath)     Past Surgical History:  Procedure Laterality Date  . CARDIAC CATHETERIZATION  08/25/2003   EF estimated 65% / stenting of the mid left  anterior descending and mid right coronary artery in 2004 with drug-eluting stent/Two vessel obstructive atherosclerotic coronary artery disease. / Normal left ventricular function. / Successful stenting of the mid right coronary artery and the mid left anterior descending  . CARDIAC CATHETERIZATION  07/17/2010   EF estimated at 65% / Left ventricular angiography was performed in the RAO view  This demonstrates normal left ventricular size and contractility with normal  systolic function / Nonobstructive atherosclerotic coronary disease with continued  excellent patency of the stents in the mid LAD & mid right coronary artery / Normal left ventricular function  . COLONOSCOPY    . CORONARY STENT INTERVENTION N/A 11/23/2018   Procedure: CORONARY STENT INTERVENTION;  Surgeon: Martinique,  M, MD;  Location: [place] [facility];  Service: Cardiovascular;  Laterality: N/A;  . LEFT HEART CATH AND CORONARY ANGIOGRAPHY N/A 11/23/2018   Procedure: LEFT HEART CATH AND CORONARY ANGIOGRAPHY;  Surgeon: Martinique,  M, MD;  Location: [place] [facility];  Service: Cardiovascular;  Laterality: N/A;  . OSTEOTOMY     removal of bone spur on (L) arm  . POLYPECTOMY      Social History   Tobacco Use  Smoking Status Former Smoker  . Packs/day: 1.00  . Types: Cigarettes  . Quit date: 09/22/2000  . Years since quitting: 19.0  Smokeless Tobacco Never Used    Social  History   Substance and Sexual Activity  Alcohol Use Yes  . Alcohol/week: 7.0 standard drinks  . Types: 7 Glasses of wine per week    Family History  Problem Relation Age of Onset  . Heart attack Father        x2  . Prostate cancer Father   . Pancreatic cancer Mother   . Stroke Brother   . Coronary artery disease Brother   . Coronary artery disease Brother   . Prostate cancer Paternal Uncle   . Diabetes Brother     Review of Systems: As noted in history of present illness. All other systems were reviewed and are negative.  Physical Exam: BP 128/82   Pulse 61   Temp 97.9 F (36.6 C)   Ht 5\' 9"  (1.753 m)   Wt 213 lb 9.6 oz (96.9 kg)   SpO2 97%  BMI 31.54 kg/m  GENERAL:  Well appearing, overweight WM HEENT:  PERRL, EOMI, sclera are clear. Oropharynx is clear. NECK:  No jugular venous distention, carotid upstroke brisk and symmetric, no bruits, no thyromegaly or adenopathy LUNGS:  Clear to auscultation bilaterally CHEST:  Unremarkable HEART:  RRR,  PMI not displaced or sustained,S1 and S2 within normal limits, no S3, no S4: no clicks, no rubs, no murmurs ABD:  Soft, nontender. BS +, no masses or bruits. No hepatomegaly, no splenomegaly EXT:  2 + pulses throughout, left leg with 1-2+ edema, nontender.  no cyanosis no clubbing SKIN:  Warm and dry.  No rashes NEURO:  Alert and oriented x 3. Cranial nerves II through XII intact. PSYCH:  Cognitively intact    LABORATORY DATA: Lab Results  Component Value Date   WBC 5.2 11/17/2018   HGB 13.2 11/17/2018   HCT 40.2 11/17/2018   PLT 285 11/17/2018   GLUCOSE 87 03/21/2019   NA 140 03/21/2019   K 3.9 03/21/2019   CL 99 03/21/2019   CREATININE 0.84 03/21/2019   BUN 10 03/21/2019   CO2 26 03/21/2019   No results found for: CHOL, HDL, LDLCALC, LDLDIRECT, TRIG, CHOLHDL   Dated 08/20/16: cholesterol 146, triglycerides 127, HDL 35, LDL 86. Chemistries, TSH, CBC normal. Dated 12/30/18: cholesterol 128, triglycerides 98,  HDL 33, LDL 75. A1c 5.1%. CMET, TSH, CBC normal Dated 02/24/17 A1c 5.2%. Dated 09/22/19: cholesterol 85, triglycerides 198, HDL 40, LDL 5. A1c 5.3%. CBC and TSH normal.  Ecg today shows NSR rate 65 with first degree AV block. Otherwise normal. I have personally reviewed and interpreted this study.   Myoview 05/04/15: Study Highlights    The left ventricular ejection fraction is normal (55-65%).  Nuclear stress EF: 61%.  Blood pressure demonstrated a hypertensive response to exercise.  Horizontal ST segment depression ST segment depression of 2 mm was noted during stress in the III, aVF and II leads.  The study is normal.  This is a low risk study.   Low risk stress nuclear study with normal perfusion and normal left ventricular regional and global systolic function. Abnormal ECG response probably represents a "false positive" study, possibly related to hypertensive response to exercise.   Myoview 07/16/18: Study Highlights   Clinically negative, electrically positive for ischemia  Horizontal ST segment depression ST segment depression was noted during stress in the II, III and aVF leads, beginning at 6 minutes of stress, and returning to baseline after 1-5 minutes of recovery.  Hypertensive response  Normal perfusion.  LVEF 59%  Low risk scan   Echo 04/28/18: Study Conclusions  - Left ventricle: The cavity size was normal. Wall thickness was   normal. Systolic function was normal. The estimated ejection   fraction was in the range of 60% to 65%. Wall motion was normal;   there were no regional wall motion abnormalities. Doppler   parameters are consistent with abnormal left ventricular   relaxation (grade 1 diastolic dysfunction). - Mitral valve: Calcified annulus.  Impressions:  - Normal LV systolic function; mild diastolic dysfunction.  Procedures  CORONARY STENT INTERVENTION  LEFT HEART CATH AND CORONARY ANGIOGRAPHY  Conclusion    Prox RCA to Mid RCA  lesion is 15% stenosed.  Previously placed Mid LAD stent (unknown type) is widely patent.  Ost 2nd Mrg lesion is 95% stenosed.  Post intervention, there is a 0% residual stenosis.  A drug-eluting stent was successfully placed using a STENT RESOLUTE ONYX 2.5X12.  The left ventricular systolic function  is normal.  LV end diastolic pressure is normal.  The left ventricular ejection fraction is 55-65% by visual estimate.   1. Single vessel obstructive CAD with 95% OM 2 lesion. Prior stents in the LAD and RCA are patent.  2. Normal LV function 3. Normal LVEDP 4. Successful PCI of the OM2 with DES  Plan: DAPT for one year. Patient is a candidate for same day discharge.       Assessment / Plan: 1. Coronary disease. Status post stenting of the mid LAD and RCA in 2004 with DES. Cardiac catheterization in 2011 showed patent stents. Myoview in 2016 and November 2019 were normal. Presented with unstable angina in March 2020 with DES of OM branch. On DAPT for one year. May stop Plavix after March 3. He is asymptomatic.   2. HTN- well controlled on Norvasc, chlorthalidone and Altace.  3. Hypercholesterolemia. On Repatha and Zetia. Statin intolerance due to myalgias. Excellent response to Repatha with LDL 5.  4. Hypothyroidism.   I will follow up in 6 months.

## 2019-10-11 ENCOUNTER — Ambulatory Visit: Payer: Medicare Other | Attending: Internal Medicine

## 2019-10-11 ENCOUNTER — Ambulatory Visit (INDEPENDENT_AMBULATORY_CARE_PROVIDER_SITE_OTHER): Payer: Medicare Other | Admitting: Cardiology

## 2019-10-11 ENCOUNTER — Encounter: Payer: Self-pay | Admitting: Cardiology

## 2019-10-11 ENCOUNTER — Other Ambulatory Visit: Payer: Self-pay

## 2019-10-11 VITALS — BP 128/82 | HR 61 | Temp 97.9°F | Ht 69.0 in | Wt 213.6 lb

## 2019-10-11 DIAGNOSIS — Z23 Encounter for immunization: Secondary | ICD-10-CM | POA: Diagnosis not present

## 2019-10-11 DIAGNOSIS — I10 Essential (primary) hypertension: Secondary | ICD-10-CM

## 2019-10-11 DIAGNOSIS — I2510 Atherosclerotic heart disease of native coronary artery without angina pectoris: Secondary | ICD-10-CM | POA: Diagnosis not present

## 2019-10-11 DIAGNOSIS — E785 Hyperlipidemia, unspecified: Secondary | ICD-10-CM

## 2019-10-11 NOTE — Progress Notes (Signed)
   Covid-19 Vaccination Clinic  Name:  [name]    MRN: [id number] DOB: Feb 12, 1951  10/11/2019  Mr. Niece was observed post Covid-19 immunization for 15 minutes without incidence. He was provided with Vaccine Information Sheet and instruction to access the V-Safe system.   [name] was instructed to call 911 with any severe reactions post vaccine: [name] Difficulty breathing  . Swelling of your face and throat  . A fast heartbeat  . A bad rash all over your body  . Dizziness and weakness    Immunizations Administered    Name Date Dose VIS Date Route   Pfizer COVID-19 Vaccine 10/11/2019 12:49 PM 0.3 mL 09/02/2019 Intramuscular   Manufacturer: [place]   Lot: [id number]   [place]: [id number]

## 2019-10-11 NOTE — Patient Instructions (Signed)
You may stop Plavix after March 3.   Continue  Your other therapy  Follow up 6 months.

## 2019-10-26 ENCOUNTER — Encounter (INDEPENDENT_AMBULATORY_CARE_PROVIDER_SITE_OTHER): Payer: Self-pay

## 2019-10-27 DIAGNOSIS — E538 Deficiency of other specified B group vitamins: Secondary | ICD-10-CM | POA: Diagnosis not present

## 2019-10-27 DIAGNOSIS — E291 Testicular hypofunction: Secondary | ICD-10-CM | POA: Diagnosis not present

## 2019-11-01 ENCOUNTER — Ambulatory Visit: Payer: Medicare Other | Attending: Internal Medicine

## 2019-11-01 DIAGNOSIS — Z23 Encounter for immunization: Secondary | ICD-10-CM | POA: Insufficient documentation

## 2019-11-01 NOTE — Progress Notes (Signed)
   Covid-19 Vaccination Clinic  Name:  [name]    MRN: [id number] DOB: 1950/11/21  11/01/2019  Mr. [name] was observed post Covid-19 immunization for 15 minutes without incidence. He was provided with Vaccine Information Sheet and instruction to access the V-Safe system.   Mr. [name] was instructed to call 911 with any severe reactions post vaccine: [name] Difficulty breathing  . Swelling of your face and throat  . A fast heartbeat  . A bad rash all over your body  . Dizziness and weakness    Immunizations Administered    Name Date Dose VIS Date Route   Pfizer COVID-19 Vaccine 11/01/2019  1:56 PM 0.3 mL 09/02/2019 Intramuscular   Manufacturer: [place]   Lot: [id number]   [place]: [id number]

## 2019-11-03 ENCOUNTER — Other Ambulatory Visit: Payer: Self-pay | Admitting: Cardiology

## 2019-11-29 DIAGNOSIS — E291 Testicular hypofunction: Secondary | ICD-10-CM | POA: Diagnosis not present

## 2019-12-28 DIAGNOSIS — E538 Deficiency of other specified B group vitamins: Secondary | ICD-10-CM | POA: Diagnosis not present

## 2019-12-28 DIAGNOSIS — E291 Testicular hypofunction: Secondary | ICD-10-CM | POA: Diagnosis not present

## 2020-01-30 DIAGNOSIS — R7301 Impaired fasting glucose: Secondary | ICD-10-CM | POA: Diagnosis not present

## 2020-01-30 DIAGNOSIS — E7849 Other hyperlipidemia: Secondary | ICD-10-CM | POA: Diagnosis not present

## 2020-01-30 DIAGNOSIS — E538 Deficiency of other specified B group vitamins: Secondary | ICD-10-CM | POA: Diagnosis not present

## 2020-01-30 DIAGNOSIS — E038 Other specified hypothyroidism: Secondary | ICD-10-CM | POA: Diagnosis not present

## 2020-01-30 DIAGNOSIS — Z125 Encounter for screening for malignant neoplasm of prostate: Secondary | ICD-10-CM | POA: Diagnosis not present

## 2020-01-31 DIAGNOSIS — E291 Testicular hypofunction: Secondary | ICD-10-CM | POA: Diagnosis not present

## 2020-01-31 DIAGNOSIS — E538 Deficiency of other specified B group vitamins: Secondary | ICD-10-CM | POA: Diagnosis not present

## 2020-01-31 DIAGNOSIS — R82998 Other abnormal findings in urine: Secondary | ICD-10-CM | POA: Diagnosis not present

## 2020-02-06 DIAGNOSIS — I2510 Atherosclerotic heart disease of native coronary artery without angina pectoris: Secondary | ICD-10-CM | POA: Diagnosis not present

## 2020-02-06 DIAGNOSIS — Z0000 Encounter for general adult medical examination without abnormal findings: Secondary | ICD-10-CM | POA: Diagnosis not present

## 2020-02-06 DIAGNOSIS — E291 Testicular hypofunction: Secondary | ICD-10-CM | POA: Diagnosis not present

## 2020-02-06 DIAGNOSIS — E038 Other specified hypothyroidism: Secondary | ICD-10-CM | POA: Diagnosis not present

## 2020-02-06 DIAGNOSIS — I10 Essential (primary) hypertension: Secondary | ICD-10-CM | POA: Diagnosis not present

## 2020-02-06 DIAGNOSIS — E538 Deficiency of other specified B group vitamins: Secondary | ICD-10-CM | POA: Diagnosis not present

## 2020-02-06 DIAGNOSIS — L989 Disorder of the skin and subcutaneous tissue, unspecified: Secondary | ICD-10-CM | POA: Insufficient documentation

## 2020-02-06 DIAGNOSIS — H6982 Other specified disorders of Eustachian tube, left ear: Secondary | ICD-10-CM | POA: Diagnosis not present

## 2020-02-06 DIAGNOSIS — E7849 Other hyperlipidemia: Secondary | ICD-10-CM | POA: Diagnosis not present

## 2020-02-06 DIAGNOSIS — R809 Proteinuria, unspecified: Secondary | ICD-10-CM | POA: Diagnosis not present

## 2020-02-21 ENCOUNTER — Other Ambulatory Visit: Payer: Self-pay | Admitting: Physician Assistant

## 2020-03-06 DIAGNOSIS — E291 Testicular hypofunction: Secondary | ICD-10-CM | POA: Diagnosis not present

## 2020-03-06 DIAGNOSIS — E538 Deficiency of other specified B group vitamins: Secondary | ICD-10-CM | POA: Diagnosis not present

## 2020-04-04 DIAGNOSIS — E291 Testicular hypofunction: Secondary | ICD-10-CM | POA: Diagnosis not present

## 2020-04-04 DIAGNOSIS — E538 Deficiency of other specified B group vitamins: Secondary | ICD-10-CM | POA: Diagnosis not present

## 2020-04-16 ENCOUNTER — Other Ambulatory Visit: Payer: Self-pay | Admitting: Physician Assistant

## 2020-04-17 DIAGNOSIS — Z85828 Personal history of other malignant neoplasm of skin: Secondary | ICD-10-CM | POA: Diagnosis not present

## 2020-04-17 DIAGNOSIS — C44319 Basal cell carcinoma of skin of other parts of face: Secondary | ICD-10-CM | POA: Diagnosis not present

## 2020-05-02 ENCOUNTER — Other Ambulatory Visit: Payer: Self-pay | Admitting: Physician Assistant

## 2020-05-23 ENCOUNTER — Other Ambulatory Visit: Payer: Self-pay | Admitting: Physician Assistant

## 2020-05-23 DIAGNOSIS — E538 Deficiency of other specified B group vitamins: Secondary | ICD-10-CM | POA: Diagnosis not present

## 2020-05-24 DIAGNOSIS — E039 Hypothyroidism, unspecified: Secondary | ICD-10-CM | POA: Diagnosis not present

## 2020-06-01 NOTE — Progress Notes (Signed)
Virtual Visit via Telephone Note   This visit type was conducted due to national recommendations for restrictions regarding the COVID-19 Pandemic (e.g. social distancing) in an effort to limit this patient's exposure and mitigate transmission in our community.  Due to his co-morbid illnesses, this patient is at least at moderate risk for complications without adequate follow up.  This format is felt to be most appropriate for this patient at this time.  The patient did not have access to video technology/had technical difficulties with video requiring transitioning to audio format only (telephone).  All issues noted in this document were discussed and addressed.  No physical exam could be performed with this format.  Please refer to the patient's chart for his  consent to telehealth for [facility].    Date:  06/05/2020   ID:  [name], DOB [birth year], MRN [id number] The patient was identified using 2 identifiers.  Patient Location: Home Provider Location: Office/Clinic  PCP:  Crist Infante, MD  Cardiologist:  Peter Martinique, MD  Electrophysiologist:  None   Evaluation Performed:  Follow-Up Visit  Chief Complaint:  Follow-up CAD  History of Present Illness:    [name] is a 69 y.o. male with a PMH of CAD s/p PCI/DES to mLAD and [place] in 2004 with subsequent PCI/DES to [place] 11/2018, HTN, HLD, hypothyroidism, OSA with dental device, who presents for routine follow-up.  He was last evaluated by cardiology at an outpatient visit with Dr. Martinique 09/2019, at which time he was doing well from a cardiac standpoint. He was recently started on repatha after trial of simvastatin resulted in myalgias with history of the same with other statins. He discontinued plavix after completing nearly 1 year of DAPT. His last echocardiogram in 2019 showed EF 60-65%, G1DD, no RWMA, and no significant valvular abnormalities. His last ischemic evaluation was a [facility] 11/2018 which showed 95% OM2 stenosis  managed with PCI/DES, patent mLAD stent, and 15% p-mRCA stenosis with EF 55-65%.   He presents today via telephone for routine follow-up. He has been doing fairly well from a cardiac standpoint since his last visit. He reports one episode of right-sided chest pain which was tender to touch, ached for a couple hours, then resolved spontaneously. He had no other associated symptoms. Likely MSK in nature. He does report some rare orthostatic symptoms, notably when working outside on a hot day and he bends down, then stand up quickly. No syncope. He also has some left ankle swelling which he attributes to issues with his left achilles tendon. His primary complaints at this time are myalgias and pain in his left achilles which interfere with his sleep.  No complaints of exertional chest pain, SOB, DOE, orthopnea, PND, or palpitations. He reports blood pressures are typically in the 130s-140s/70s-80s at home.   The patient does not have symptoms concerning for COVID-19 infection (fever, chills, cough, or new shortness of breath).    Past Medical History:  Diagnosis Date   Benign paroxysmal positional vertigo    Coronary artery disease 2004   with prior stenting of the mid LAD and mid right coronary  / cardiac cath with satifsfactory finding following abnormal stress test   Depressive disorder, not elsewhere classified    Gastritis    Gluten enteropathy    Heart palpitations    went to ER on 04/27/2006 for palpitations   Hypercholesteremia    Hypertension    Hypothyroidism    Iron deficiency anemia, unspecified    Myalgia and  myositis, unspecified    Personal history of colonic polyps 10/2006   hyperplastic polyp   SOB (shortness of breath)    Past Surgical History:  Procedure Laterality Date   CARDIAC CATHETERIZATION  08/25/2003   EF estimated 65% / stenting of the mid left  anterior descending and mid right coronary artery in 2004 with drug-eluting stent/Two vessel obstructive  atherosclerotic coronary artery disease. / Normal left ventricular function. / Successful stenting of the mid right coronary artery and the mid left anterior descending   CARDIAC CATHETERIZATION  07/17/2010   EF estimated at 65% / Left ventricular angiography was performed in the RAO view  This demonstrates normal left ventricular size and contractility with normal  systolic function / Nonobstructive atherosclerotic coronary disease with continued  excellent patency of the stents in the mid LAD & mid right coronary artery / Normal left ventricular function   COLONOSCOPY     CORONARY STENT INTERVENTION N/A 11/23/2018   Procedure: CORONARY STENT INTERVENTION;  Surgeon: Martinique, Peter M, MD;  Location: [place] [facility];  Service: Cardiovascular;  Laterality: N/A;   LEFT HEART CATH AND CORONARY ANGIOGRAPHY N/A 11/23/2018   Procedure: LEFT HEART CATH AND CORONARY ANGIOGRAPHY;  Surgeon: Martinique, Peter M, MD;  Location: [place] [facility];  Service: Cardiovascular;  Laterality: N/A;   OSTEOTOMY     removal of bone spur on (L) arm   POLYPECTOMY       Current Meds  Medication Sig   amLODipine (NORVASC) 10 MG tablet TAKE 1 TABLET ONCE DAILY.   aspirin 81 MG tablet Take 81 mg by mouth daily.   buPROPion (WELLBUTRIN XL) 300 MG 24 hr tablet Take 300 mg by mouth daily.   chlorthalidone (HYGROTON) 25 MG tablet Take 1 tablet (25 mg total) by mouth daily.   cholecalciferol (VITAMIN D3) 25 MCG (1000 UT) tablet Take 1,000 Units by mouth daily.   Coenzyme Q10 (COQ10) 100 MG CAPS Take 100 mg by mouth daily.   cyanocobalamin (,VITAMIN B-12,) 1000 MCG/ML injection Inject 1,000 mcg into the muscle every 30 (thirty) days.     ezetimibe (ZETIA) 10 MG tablet TAKE 1 TABLET ONCE DAILY.   fluticasone (CUTIVATE) 0.05 % cream Apply 1 application topically daily as needed (rash).    ketoconazole (NIZORAL) 2 % shampoo Apply 1 application topically 3 (three) times a week.    levothyroxine (SYNTHROID, LEVOTHROID)  200 MCG tablet Take 200 mcg by mouth See admin instructions.    ramipril (ALTACE) 10 MG capsule Take 2 capsules (20 mg total) by mouth daily.   REPATHA SURECLICK 545 MG/ML SOAJ Inject 1 Syringe into the skin every 14 (fourteen) days.   sildenafil (REVATIO) 20 MG tablet Take 80 mg by mouth daily as needed (ED).    testosterone cypionate (DEPOTESTOSTERONE CYPIONATE) 200 MG/ML injection testosterone cypionate 200 mg/mL intramuscular oil   [DISCONTINUED] ramipril (ALTACE) 10 MG capsule TAKE (1) CAPSULE DAILY.     Allergies:   Patient has no known allergies.   Social History   Tobacco Use   Smoking status: Former Smoker    Packs/day: 1.00    Types: Cigarettes    Quit date: 09/22/2000    Years since quitting: 19.7   Smokeless tobacco: Never Used  Vaping Use   Vaping Use: Never used  Substance Use Topics   Alcohol use: Yes    Alcohol/week: 7.0 standard drinks    Types: 7 Glasses of wine per week   Drug use: No     Family Hx: The  patient's family history includes Coronary artery disease in his brother and brother; Diabetes in his brother; Heart attack in his father; Pancreatic cancer in his mother; Prostate cancer in his father and paternal uncle; Stroke in his brother.  ROS:   Please see the history of present illness.     All other systems reviewed and are negative.   Prior CV studies:   The following studies were reviewed today:  LHC 11/2018:  Prox RCA to Mid RCA lesion is 15% stenosed.  Previously placed Mid LAD stent (unknown type) is widely patent.  Ost 2nd Mrg lesion is 95% stenosed.  Post intervention, there is a 0% residual stenosis.  A drug-eluting stent was successfully placed using a STENT RESOLUTE ONYX 2.5X12.  The left ventricular systolic function is normal.  LV end diastolic pressure is normal.  The left ventricular ejection fraction is 55-65% by visual estimate.   1. Single vessel obstructive CAD with 95% OM 2 lesion. Prior stents in the LAD  and RCA are patent.  2. Normal LV function 3. Normal LVEDP 4. Successful PCI of the OM2 with DES  Plan: DAPT for one year. Patient is a candidate for same day discharge.   Echocardiogram 2019: - Left ventricle: The cavity size was normal. Wall thickness was  normal. Systolic function was normal. The estimated ejection  fraction was in the range of 60% to 65%. Wall motion was normal;  there were no regional wall motion abnormalities. Doppler  parameters are consistent with abnormal left ventricular  relaxation (grade 1 diastolic dysfunction).  - Mitral valve: Calcified annulus.   Impressions:   - Normal LV systolic function; mild diastolic dysfunction.   Labs/Other Tests and Data Reviewed:    EKG:  An ECG dated 10/01/19 was personally reviewed today and demonstrated:  sinus rhythm with 1st degree AV block, rate 65 bpm, no STE/D, no TWI.  Recent Labs: No results found for requested labs within last 8760 hours.   Recent Lipid Panel No results found for: CHOL, TRIG, HDL, CHOLHDL, LDLCALC, LDLDIRECT  Wt Readings from Last 3 Encounters:  06/05/20 209 lb (94.8 kg)  10/11/19 213 lb 9.6 oz (96.9 kg)  04/14/19 211 lb 9.6 oz (96 kg)     Objective:    Vital Signs:  BP (!) 145/77    Pulse 72    Ht 5\' 10"  (1.778 m)    Wt 209 lb (94.8 kg)    BMI 29.99 kg/m    VITAL SIGNS:  reviewed GEN:  no acute distress RESPIRATORY:  speaking in full sentences without SOB CARDIOVASCULAR:  no peripheral edema PSYCH:  normal affect  ASSESSMENT & PLAN:    1. CAD s/p PCI/DES to mLAD and mRCA in 2004 with subsequent PCI/DES to OM2 11/2018: no anginal complaints - Continue aspirin, zetia, and repatha - Continue amlodipine  2. HTN: BP 147/77 today - Will increase ramipril to 20mg  daily to reach BP goal of <130/80 - Will check BMET in 2 weeks for close monitoring of his kidney function/electrolytes  - Continue amlodipine and chlorthalidone  3. HLD: LDL 65 01/2020; doing well on repatha and  zetia; prior statin intolerance - Continue repatha and zetia  4. Hypothyroidism: managed by PCP  - Continue levothyroxine  5. Achilles pain: has seen ortho in the past - Encouraged following back up with ortho and perhaps PT to help  6. Myalgias: unclear etiology. Does have family history of autoimmune disorders - Encouraged PCP follow-up to consider rheumatologic work-up.    Time:  Today, I have spent 16 minutes with the patient with telehealth technology discussing the above problems.     Medication Adjustments/Labs and Tests Ordered: Current medicines are reviewed at length with the patient today.  Concerns regarding medicines are outlined above.   Tests Ordered: Orders Placed This Encounter  Procedures   Basic metabolic panel    Medication Changes: Meds ordered this encounter  Medications   ramipril (ALTACE) 10 MG capsule    Sig: Take 2 capsules (20 mg total) by mouth daily.    Dispense:  180 capsule    Refill:  1    Follow Up:  In Person in 6 month(s) with Dr. Martinique  Signed, Abigail Butts, PA-C  06/05/2020 9:38 AM    [place]

## 2020-06-05 ENCOUNTER — Telehealth: Payer: Self-pay

## 2020-06-05 ENCOUNTER — Telehealth (INDEPENDENT_AMBULATORY_CARE_PROVIDER_SITE_OTHER): Payer: Medicare Other | Admitting: Medical

## 2020-06-05 ENCOUNTER — Encounter: Payer: Self-pay | Admitting: Medical

## 2020-06-05 VITALS — BP 145/77 | HR 72 | Ht 70.0 in | Wt 209.0 lb

## 2020-06-05 DIAGNOSIS — Z79899 Other long term (current) drug therapy: Secondary | ICD-10-CM | POA: Diagnosis not present

## 2020-06-05 DIAGNOSIS — I10 Essential (primary) hypertension: Secondary | ICD-10-CM | POA: Diagnosis not present

## 2020-06-05 DIAGNOSIS — I2510 Atherosclerotic heart disease of native coronary artery without angina pectoris: Secondary | ICD-10-CM | POA: Diagnosis not present

## 2020-06-05 DIAGNOSIS — E039 Hypothyroidism, unspecified: Secondary | ICD-10-CM | POA: Diagnosis not present

## 2020-06-05 DIAGNOSIS — E785 Hyperlipidemia, unspecified: Secondary | ICD-10-CM | POA: Diagnosis not present

## 2020-06-05 MED ORDER — RAMIPRIL 10 MG PO CAPS: 20.0000 mg | ORAL_CAPSULE | Freq: Every day | ORAL | 1 refills | Status: DC

## 2020-06-05 NOTE — Telephone Encounter (Signed)
°  Patient Consent for Virtual Visit         [name] has provided verbal consent on 06/05/2020 for a virtual visit (video or telephone).   CONSENT FOR VIRTUAL VISIT FOR:  [name]  By participating in this virtual visit I agree to the following:  I hereby voluntarily request, consent and authorize [facility] and its employed or contracted physicians, physician assistants, nurse practitioners or other licensed health care professionals (the Practitioner), to provide me with telemedicine health care services (the Services") as deemed necessary by the treating Practitioner. I acknowledge and consent to receive the Services by the Practitioner via telemedicine. I understand that the telemedicine visit will involve communicating with the Practitioner through live audiovisual communication technology and the disclosure of certain medical information by electronic transmission. I acknowledge that I have been given the opportunity to request an in-person assessment or other available alternative prior to the telemedicine visit and am voluntarily participating in the telemedicine visit.  I understand that I have the right to withhold or withdraw my consent to the use of telemedicine in the course of my care at any time, without affecting my right to future care or treatment, and that the Practitioner or I may terminate the telemedicine visit at any time. I understand that I have the right to inspect all information obtained and/or recorded in the course of the telemedicine visit and may receive copies of available information for a reasonable fee.  I understand that some of the potential risks of receiving the Services via telemedicine include:   Delay or interruption in medical evaluation due to technological equipment failure or disruption;  Information transmitted may not be sufficient (e.g. poor resolution of images) to allow for appropriate medical decision making by the Practitioner;  and/or   In rare instances, security protocols could fail, causing a breach of personal health information.  Furthermore, I acknowledge that it is my responsibility to provide information about my medical history, conditions and care that is complete and accurate to the best of my ability. I acknowledge that Practitioner's advice, recommendations, and/or decision may be based on factors not within their control, such as incomplete or inaccurate data provided by me or distortions of diagnostic images or specimens that may result from electronic transmissions. I understand that the practice of medicine is not an exact science and that Practitioner makes no warranties or guarantees regarding treatment outcomes. I acknowledge that a copy of this consent can be made available to me via my patient portal ([facility]), or I can request a printed copy by calling the office of [facility].    I understand that my insurance will be billed for this visit.   I have read or had this consent read to me.  I understand the contents of this consent, which adequately explains the benefits and risks of the Services being provided via telemedicine.   I have been provided ample opportunity to ask questions regarding this consent and the Services and have had my questions answered to my satisfaction.  I give my informed consent for the services to be provided through the use of telemedicine in my medical care

## 2020-06-05 NOTE — Patient Instructions (Signed)
Medication Instructions:   INCREASE Rampril to 20 mg daily *If you need a refill on your cardiac medications before your next appointment, please call your pharmacy*  Lab Work: Your physician recommends that you return for lab work in 2 weeks:   BMET  If you have labs (blood work) drawn today and your tests are completely normal, you will receive your results only by: [name] MyChart Message (if you have MyChart) OR . A paper copy in the mail If you have any lab test that is abnormal or we need to change your treatment, we will call you to review the results.  Testing/Procedures: NONE ordered at this time of appointment   Follow-Up: At [facility], you and your health needs are our priority.  As part of our continuing mission to provide you with exceptional heart care, we have created designated Provider Care Teams.  These Care Teams include your primary Cardiologist (physician) and Advanced Practice Providers (APPs -  Physician Assistants and Nurse Practitioners) who all work together to provide you with the care you need, when you need it.   Your next appointment:    6 month(s)  The format for your next appointment:   In Person  Provider:   Peter Martinique, MD  Other Instructions

## 2020-06-14 ENCOUNTER — Encounter (INDEPENDENT_AMBULATORY_CARE_PROVIDER_SITE_OTHER): Payer: Self-pay

## 2020-06-20 DIAGNOSIS — Z79899 Other long term (current) drug therapy: Secondary | ICD-10-CM | POA: Diagnosis not present

## 2020-06-20 DIAGNOSIS — I2510 Atherosclerotic heart disease of native coronary artery without angina pectoris: Secondary | ICD-10-CM | POA: Diagnosis not present

## 2020-06-21 LAB — BASIC METABOLIC PANEL
BUN/Creatinine Ratio: 18 (ref 10–24)
BUN: 17 mg/dL (ref 8–27)
CO2: 28 mmol/L (ref 20–29)
Calcium: 9.4 mg/dL (ref 8.6–10.2)
Chloride: 98 mmol/L (ref 96–106)
Creatinine, Ser: 0.92 mg/dL (ref 0.76–1.27)
GFR calc Af Amer: 99 mL/min/{1.73_m2} (ref >59)
GFR calc non Af Amer: 86 mL/min/{1.73_m2} (ref >59)
Glucose: 72 mg/dL (ref 65–99)
Potassium: 4.4 mmol/L (ref 3.5–5.2)
Sodium: 141 mmol/L (ref 134–144)

## 2020-06-22 DIAGNOSIS — D509 Iron deficiency anemia, unspecified: Secondary | ICD-10-CM | POA: Diagnosis not present

## 2020-06-22 DIAGNOSIS — E291 Testicular hypofunction: Secondary | ICD-10-CM | POA: Diagnosis not present

## 2020-06-22 DIAGNOSIS — E538 Deficiency of other specified B group vitamins: Secondary | ICD-10-CM | POA: Diagnosis not present

## 2020-06-22 DIAGNOSIS — I2510 Atherosclerotic heart disease of native coronary artery without angina pectoris: Secondary | ICD-10-CM | POA: Diagnosis not present

## 2020-06-22 DIAGNOSIS — E039 Hypothyroidism, unspecified: Secondary | ICD-10-CM | POA: Diagnosis not present

## 2020-06-22 DIAGNOSIS — R7301 Impaired fasting glucose: Secondary | ICD-10-CM | POA: Diagnosis not present

## 2020-06-22 DIAGNOSIS — Z23 Encounter for immunization: Secondary | ICD-10-CM | POA: Diagnosis not present

## 2020-06-22 DIAGNOSIS — M7660 Achilles tendinitis, unspecified leg: Secondary | ICD-10-CM | POA: Diagnosis not present

## 2020-06-22 DIAGNOSIS — E785 Hyperlipidemia, unspecified: Secondary | ICD-10-CM | POA: Diagnosis not present

## 2020-06-22 DIAGNOSIS — K900 Celiac disease: Secondary | ICD-10-CM | POA: Diagnosis not present

## 2020-06-22 DIAGNOSIS — I10 Essential (primary) hypertension: Secondary | ICD-10-CM | POA: Diagnosis not present

## 2020-06-27 DIAGNOSIS — E291 Testicular hypofunction: Secondary | ICD-10-CM | POA: Diagnosis not present

## 2020-06-29 ENCOUNTER — Other Ambulatory Visit: Payer: Medicare Other

## 2020-06-29 DIAGNOSIS — Z20822 Contact with and (suspected) exposure to covid-19: Secondary | ICD-10-CM

## 2020-06-30 LAB — SARS-COV-2, NAA 2 DAY TAT

## 2020-06-30 LAB — NOVEL CORONAVIRUS, NAA: SARS-CoV-2, NAA: NOT DETECTED

## 2020-07-16 DIAGNOSIS — H2513 Age-related nuclear cataract, bilateral: Secondary | ICD-10-CM | POA: Diagnosis not present

## 2020-07-16 DIAGNOSIS — H0288B Meibomian gland dysfunction left eye, upper and lower eyelids: Secondary | ICD-10-CM | POA: Diagnosis not present

## 2020-07-16 DIAGNOSIS — H524 Presbyopia: Secondary | ICD-10-CM | POA: Diagnosis not present

## 2020-07-16 DIAGNOSIS — H0288A Meibomian gland dysfunction right eye, upper and lower eyelids: Secondary | ICD-10-CM | POA: Diagnosis not present

## 2020-07-16 DIAGNOSIS — H52203 Unspecified astigmatism, bilateral: Secondary | ICD-10-CM | POA: Diagnosis not present

## 2020-07-16 DIAGNOSIS — S86012D Strain of left Achilles tendon, subsequent encounter: Secondary | ICD-10-CM | POA: Diagnosis not present

## 2020-07-16 DIAGNOSIS — H40013 Open angle with borderline findings, low risk, bilateral: Secondary | ICD-10-CM | POA: Diagnosis not present

## 2020-07-16 DIAGNOSIS — H33322 Round hole, left eye: Secondary | ICD-10-CM | POA: Diagnosis not present

## 2020-07-16 DIAGNOSIS — H5213 Myopia, bilateral: Secondary | ICD-10-CM | POA: Diagnosis not present

## 2020-07-17 ENCOUNTER — Ambulatory Visit: Payer: Medicare Other | Attending: Internal Medicine

## 2020-07-17 DIAGNOSIS — Z23 Encounter for immunization: Secondary | ICD-10-CM

## 2020-07-17 NOTE — Progress Notes (Signed)
   Covid-19 Vaccination Clinic  Name:  GIANKARLO LEAMER    MRN: [id number] DOB: [birth year]  07/17/2020  Mr. Kowal was observed post Covid-19 immunization for 15 minutes without incident. He was provided with Vaccine Information Sheet and instruction to access the V-Safe system.   Mr. Spraggins was instructed to call 911 with any severe reactions post vaccine: [name] Difficulty breathing  . Swelling of face and throat  . A fast heartbeat  . A bad rash all over body  . Dizziness and weakness

## 2020-07-18 ENCOUNTER — Encounter (INDEPENDENT_AMBULATORY_CARE_PROVIDER_SITE_OTHER): Payer: Medicare Other | Admitting: Ophthalmology

## 2020-07-18 ENCOUNTER — Encounter (INDEPENDENT_AMBULATORY_CARE_PROVIDER_SITE_OTHER): Payer: Self-pay

## 2020-07-18 DIAGNOSIS — I10 Essential (primary) hypertension: Secondary | ICD-10-CM

## 2020-07-18 DIAGNOSIS — H43813 Vitreous degeneration, bilateral: Secondary | ICD-10-CM

## 2020-07-18 DIAGNOSIS — H35033 Hypertensive retinopathy, bilateral: Secondary | ICD-10-CM | POA: Diagnosis not present

## 2020-07-18 DIAGNOSIS — H33022 Retinal detachment with multiple breaks, left eye: Secondary | ICD-10-CM | POA: Diagnosis not present

## 2020-07-20 DIAGNOSIS — R269 Unspecified abnormalities of gait and mobility: Secondary | ICD-10-CM | POA: Diagnosis not present

## 2020-07-20 DIAGNOSIS — M7662 Achilles tendinitis, left leg: Secondary | ICD-10-CM | POA: Diagnosis not present

## 2020-07-20 DIAGNOSIS — M25672 Stiffness of left ankle, not elsewhere classified: Secondary | ICD-10-CM | POA: Diagnosis not present

## 2020-07-20 DIAGNOSIS — M6281 Muscle weakness (generalized): Secondary | ICD-10-CM | POA: Diagnosis not present

## 2020-07-23 ENCOUNTER — Encounter (INDEPENDENT_AMBULATORY_CARE_PROVIDER_SITE_OTHER): Payer: Self-pay | Admitting: Ophthalmology

## 2020-07-23 DIAGNOSIS — M6281 Muscle weakness (generalized): Secondary | ICD-10-CM | POA: Diagnosis not present

## 2020-07-23 DIAGNOSIS — R269 Unspecified abnormalities of gait and mobility: Secondary | ICD-10-CM | POA: Diagnosis not present

## 2020-07-23 DIAGNOSIS — M25672 Stiffness of left ankle, not elsewhere classified: Secondary | ICD-10-CM | POA: Diagnosis not present

## 2020-07-23 DIAGNOSIS — M7662 Achilles tendinitis, left leg: Secondary | ICD-10-CM | POA: Diagnosis not present

## 2020-07-25 DIAGNOSIS — E291 Testicular hypofunction: Secondary | ICD-10-CM | POA: Diagnosis not present

## 2020-08-01 ENCOUNTER — Other Ambulatory Visit: Payer: Self-pay

## 2020-08-01 ENCOUNTER — Encounter (INDEPENDENT_AMBULATORY_CARE_PROVIDER_SITE_OTHER): Payer: Medicare Other | Admitting: Ophthalmology

## 2020-08-01 DIAGNOSIS — H33302 Unspecified retinal break, left eye: Secondary | ICD-10-CM

## 2020-08-01 DIAGNOSIS — M25672 Stiffness of left ankle, not elsewhere classified: Secondary | ICD-10-CM | POA: Diagnosis not present

## 2020-08-01 DIAGNOSIS — R269 Unspecified abnormalities of gait and mobility: Secondary | ICD-10-CM | POA: Diagnosis not present

## 2020-08-01 DIAGNOSIS — M6281 Muscle weakness (generalized): Secondary | ICD-10-CM | POA: Diagnosis not present

## 2020-08-01 DIAGNOSIS — M7662 Achilles tendinitis, left leg: Secondary | ICD-10-CM | POA: Diagnosis not present

## 2020-08-03 ENCOUNTER — Other Ambulatory Visit: Payer: Self-pay | Admitting: Cardiology

## 2020-08-03 DIAGNOSIS — M25672 Stiffness of left ankle, not elsewhere classified: Secondary | ICD-10-CM | POA: Diagnosis not present

## 2020-08-03 DIAGNOSIS — M7662 Achilles tendinitis, left leg: Secondary | ICD-10-CM | POA: Diagnosis not present

## 2020-08-03 DIAGNOSIS — M6281 Muscle weakness (generalized): Secondary | ICD-10-CM | POA: Diagnosis not present

## 2020-08-03 DIAGNOSIS — R269 Unspecified abnormalities of gait and mobility: Secondary | ICD-10-CM | POA: Diagnosis not present

## 2020-08-07 DIAGNOSIS — M6281 Muscle weakness (generalized): Secondary | ICD-10-CM | POA: Diagnosis not present

## 2020-08-07 DIAGNOSIS — M7662 Achilles tendinitis, left leg: Secondary | ICD-10-CM | POA: Diagnosis not present

## 2020-08-07 DIAGNOSIS — R269 Unspecified abnormalities of gait and mobility: Secondary | ICD-10-CM | POA: Diagnosis not present

## 2020-08-07 DIAGNOSIS — M25672 Stiffness of left ankle, not elsewhere classified: Secondary | ICD-10-CM | POA: Diagnosis not present

## 2020-08-08 ENCOUNTER — Other Ambulatory Visit: Payer: Self-pay

## 2020-08-10 DIAGNOSIS — M25672 Stiffness of left ankle, not elsewhere classified: Secondary | ICD-10-CM | POA: Diagnosis not present

## 2020-08-10 DIAGNOSIS — M7662 Achilles tendinitis, left leg: Secondary | ICD-10-CM | POA: Diagnosis not present

## 2020-08-10 DIAGNOSIS — M6281 Muscle weakness (generalized): Secondary | ICD-10-CM | POA: Diagnosis not present

## 2020-08-10 DIAGNOSIS — R269 Unspecified abnormalities of gait and mobility: Secondary | ICD-10-CM | POA: Diagnosis not present

## 2020-08-13 DIAGNOSIS — R269 Unspecified abnormalities of gait and mobility: Secondary | ICD-10-CM | POA: Diagnosis not present

## 2020-08-13 DIAGNOSIS — M25672 Stiffness of left ankle, not elsewhere classified: Secondary | ICD-10-CM | POA: Diagnosis not present

## 2020-08-13 DIAGNOSIS — M6281 Muscle weakness (generalized): Secondary | ICD-10-CM | POA: Diagnosis not present

## 2020-08-21 DIAGNOSIS — M6281 Muscle weakness (generalized): Secondary | ICD-10-CM | POA: Diagnosis not present

## 2020-08-21 DIAGNOSIS — R269 Unspecified abnormalities of gait and mobility: Secondary | ICD-10-CM | POA: Diagnosis not present

## 2020-08-21 DIAGNOSIS — M7662 Achilles tendinitis, left leg: Secondary | ICD-10-CM | POA: Diagnosis not present

## 2020-08-21 DIAGNOSIS — M25672 Stiffness of left ankle, not elsewhere classified: Secondary | ICD-10-CM | POA: Diagnosis not present

## 2020-08-23 DIAGNOSIS — D509 Iron deficiency anemia, unspecified: Secondary | ICD-10-CM | POA: Diagnosis not present

## 2020-08-23 DIAGNOSIS — E291 Testicular hypofunction: Secondary | ICD-10-CM | POA: Diagnosis not present

## 2020-08-23 DIAGNOSIS — E538 Deficiency of other specified B group vitamins: Secondary | ICD-10-CM | POA: Diagnosis not present

## 2020-08-24 DIAGNOSIS — R269 Unspecified abnormalities of gait and mobility: Secondary | ICD-10-CM | POA: Diagnosis not present

## 2020-08-24 DIAGNOSIS — M6281 Muscle weakness (generalized): Secondary | ICD-10-CM | POA: Diagnosis not present

## 2020-08-24 DIAGNOSIS — M25672 Stiffness of left ankle, not elsewhere classified: Secondary | ICD-10-CM | POA: Diagnosis not present

## 2020-08-24 DIAGNOSIS — M7662 Achilles tendinitis, left leg: Secondary | ICD-10-CM | POA: Diagnosis not present

## 2020-08-31 DIAGNOSIS — M7662 Achilles tendinitis, left leg: Secondary | ICD-10-CM | POA: Diagnosis not present

## 2020-09-04 DIAGNOSIS — M6281 Muscle weakness (generalized): Secondary | ICD-10-CM | POA: Diagnosis not present

## 2020-09-04 DIAGNOSIS — R269 Unspecified abnormalities of gait and mobility: Secondary | ICD-10-CM | POA: Diagnosis not present

## 2020-09-04 DIAGNOSIS — M25672 Stiffness of left ankle, not elsewhere classified: Secondary | ICD-10-CM | POA: Diagnosis not present

## 2020-09-04 DIAGNOSIS — M7662 Achilles tendinitis, left leg: Secondary | ICD-10-CM | POA: Diagnosis not present

## 2020-09-06 DIAGNOSIS — M6281 Muscle weakness (generalized): Secondary | ICD-10-CM | POA: Diagnosis not present

## 2020-09-06 DIAGNOSIS — M25672 Stiffness of left ankle, not elsewhere classified: Secondary | ICD-10-CM | POA: Diagnosis not present

## 2020-09-06 DIAGNOSIS — M7662 Achilles tendinitis, left leg: Secondary | ICD-10-CM | POA: Diagnosis not present

## 2020-09-06 DIAGNOSIS — R269 Unspecified abnormalities of gait and mobility: Secondary | ICD-10-CM | POA: Diagnosis not present

## 2020-09-11 ENCOUNTER — Other Ambulatory Visit: Payer: Medicare Other

## 2020-09-11 DIAGNOSIS — Z20822 Contact with and (suspected) exposure to covid-19: Secondary | ICD-10-CM | POA: Diagnosis not present

## 2020-09-13 LAB — NOVEL CORONAVIRUS, NAA: SARS-CoV-2, NAA: NOT DETECTED

## 2020-09-13 LAB — SARS-COV-2, NAA 2 DAY TAT

## 2020-10-13 ENCOUNTER — Other Ambulatory Visit: Payer: Self-pay | Admitting: Cardiology

## 2020-10-15 ENCOUNTER — Other Ambulatory Visit: Payer: Medicare Other

## 2020-10-15 DIAGNOSIS — Z20822 Contact with and (suspected) exposure to covid-19: Secondary | ICD-10-CM

## 2020-10-16 LAB — NOVEL CORONAVIRUS, NAA: SARS-CoV-2, NAA: NOT DETECTED

## 2020-10-16 LAB — SARS-COV-2, NAA 2 DAY TAT

## 2020-11-14 ENCOUNTER — Other Ambulatory Visit: Payer: Self-pay | Admitting: Cardiology

## 2020-11-24 ENCOUNTER — Other Ambulatory Visit: Payer: Self-pay | Admitting: Physician Assistant

## 2020-12-03 ENCOUNTER — Encounter (INDEPENDENT_AMBULATORY_CARE_PROVIDER_SITE_OTHER): Payer: Medicare Other | Admitting: Ophthalmology

## 2020-12-08 ENCOUNTER — Other Ambulatory Visit: Payer: Self-pay | Admitting: Cardiology

## 2021-01-25 NOTE — Progress Notes (Deleted)
[name] Date of Birth: 11/11/1950   History of Present Illness: [name] is seen for followup of his coronary disease. He has a history of stenting of the mid LAD and mid right coronary in 2004 with DES. In 2011 he had a nuclear stress test. He was able to exercise for 8 minutes with significant ST segment changes. Perfusion imaging was normal. He did undergo repeat cardiac catheterization which showed nonobstructive disease and continued excellent patency of his stents. Repeat myoview in 2016 still showed ST segment changes but normal perfusion and normal EF.   Followed by Dr. Brett Fairy for evaluation of snoring and REM dependent apnea. Fitted for a dental device.   He was seen by Fabian Sharp in August 2019 for dyspnea. Echocardiogram obtained on 04/28/2018 demonstrated normal EF 60 to 65%, with grade 1 DD. He returned in October 2019 with worsening dyspnea. Myoview was repeated on 07/16/2018 which showed horizontal ST depression in inferior leads, hypertensive response, normal perfusion, EF 59%. He was seen on on 11/16/2018, at which time he continued to have significant left-sided chest pain with exertion.  Symptom was concerning for unstable angina.  He had cardiac catheterization which was performed on 11/23/2018, this revealed a 95% ostial OM 2 lesion treated with resolute Onyx 2.5 x 12 mm DES, 15% proximal to mid RCA residual, widely patent mid LAD stent.  On subsequent follow up was persistently hypertensive. Amlodipine increased and then started on chlorthalidone. On his last visit his ramipril was increased.   On follow up today he is doing well from a cardiac standpoint. No chest pain or SOB. His weight is stable. He is walking some. He is intolerant to statins.  Started on Repatha with excellent results.   Current Outpatient Medications on File Prior to Visit  Medication Sig Dispense Refill  . amLODipine (NORVASC) 10 MG tablet TAKE 1 TABLET ONCE DAILY. 90 tablet 0  . aspirin 81  MG tablet Take 81 mg by mouth daily.    [name] buPROPion (WELLBUTRIN XL) 300 MG 24 hr tablet Take 300 mg by mouth daily.    . chlorthalidone (HYGROTON) 25 MG tablet TAKE 1 TABLET ONCE DAILY. 30 tablet 7  . cholecalciferol (VITAMIN D3) 25 MCG (1000 UT) tablet Take 1,000 Units by mouth daily.    . Coenzyme Q10 (COQ10) 100 MG CAPS Take 100 mg by mouth daily.    . cyanocobalamin (,VITAMIN B-12,) 1000 MCG/ML injection Inject 1,000 mcg into the muscle every 30 (thirty) days.      [name] ezetimibe (ZETIA) 10 MG tablet TAKE 1 TABLET ONCE DAILY. 90 tablet 1  . fluticasone (CUTIVATE) 0.05 % cream Apply 1 application topically daily as needed (rash).     [name] ketoconazole (NIZORAL) 2 % shampoo Apply 1 application topically 3 (three) times a week.     . levothyroxine (SYNTHROID, LEVOTHROID) 200 MCG tablet Take 200 mcg by mouth See admin instructions.     . nitroGLYCERIN (NITROSTAT) 0.4 MG SL tablet Place 1 tablet (0.4 mg total) under the tongue every 5 (five) minutes as needed for chest pain. 25 tablet 3  . ramipril (ALTACE) 10 MG capsule Take 2 capsules (20 mg total) by mouth daily. 180 capsule 1  . REPATHA SURECLICK 323 MG/ML SOAJ Inject 1 Syringe into the skin every 14 (fourteen) days.    . sildenafil (REVATIO) 20 MG tablet Take 80 mg by mouth daily as needed (ED).     [name] testosterone cypionate (DEPOTESTOSTERONE CYPIONATE) 200 MG/ML injection testosterone cypionate 200  mg/mL intramuscular oil     No current facility-administered medications on file prior to visit.    No Known Allergies  Past Medical History:  Diagnosis Date  . Benign paroxysmal positional vertigo   . Coronary artery disease 2004   with prior stenting of the mid LAD and mid right coronary  / cardiac cath with satifsfactory finding following abnormal stress test  . Depressive disorder, not elsewhere classified   . Gastritis   . Gluten enteropathy   . Heart palpitations    went to ER on 04/27/2006 for palpitations  . Hypercholesteremia   .  Hypertension   . Hypothyroidism   . Iron deficiency anemia, unspecified   . Myalgia and myositis, unspecified   . Personal history of colonic polyps 10/2006   hyperplastic polyp  . SOB (shortness of breath)     Past Surgical History:  Procedure Laterality Date  . CARDIAC CATHETERIZATION  08/25/2003   EF estimated 65% / stenting of the mid left  anterior descending and mid right coronary artery in 2004 with drug-eluting stent/Two vessel obstructive atherosclerotic coronary artery disease. / Normal left ventricular function. / Successful stenting of the mid right coronary artery and the mid left anterior descending  . CARDIAC CATHETERIZATION  07/17/2010   EF estimated at 65% / Left ventricular angiography was performed in the RAO view  This demonstrates normal left ventricular size and contractility with normal  systolic function / Nonobstructive atherosclerotic coronary disease with continued  excellent patency of the stents in the mid LAD & mid right coronary artery / Normal left ventricular function  . COLONOSCOPY    . CORONARY STENT INTERVENTION N/A 11/23/2018   Procedure: CORONARY STENT INTERVENTION;  Surgeon: Martinique, Joretta Eads M, MD;  Location: [facility];  Service: Cardiovascular;  Laterality: N/A;  . LEFT HEART CATH AND CORONARY ANGIOGRAPHY N/A 11/23/2018   Procedure: LEFT HEART CATH AND CORONARY ANGIOGRAPHY;  Surgeon: Martinique, Trenita Hulme M, MD;  Location: [facility];  Service: Cardiovascular;  Laterality: N/A;  . OSTEOTOMY     removal of bone spur on (L) arm  . POLYPECTOMY      Social History   Tobacco Use  Smoking Status Former Smoker  . Packs/day: 1.00  . Types: Cigarettes  . Quit date: 09/22/2000  . Years since quitting: 20.3  Smokeless Tobacco Never Used    Social History   Substance and Sexual Activity  Alcohol Use Yes  . Alcohol/week: 7.0 standard drinks  . Types: 7 Glasses of wine per week    Family History  Problem Relation Age of Onset  . Heart attack  Father        x2  . Prostate cancer Father   . Pancreatic cancer Mother   . Stroke Brother   . Coronary artery disease Brother   . Coronary artery disease Brother   . Prostate cancer Paternal Uncle   . Diabetes Brother     Review of Systems: As noted in history of present illness. All other systems were reviewed and are negative.  Physical Exam: There were no vitals taken for this visit. GENERAL:  Well appearing, overweight WM HEENT:  PERRL, EOMI, sclera are clear. Oropharynx is clear. NECK:  No jugular venous distention, carotid upstroke brisk and symmetric, no bruits, no thyromegaly or adenopathy LUNGS:  Clear to auscultation bilaterally CHEST:  Unremarkable HEART:  RRR,  PMI not displaced or sustained,S1 and S2 within normal limits, no S3, no S4: no clicks, no rubs, no murmurs ABD:  Soft, nontender. BS +, no masses or bruits. No hepatomegaly, no splenomegaly EXT:  2 + pulses throughout, left leg with 1-2+ edema, nontender.  no cyanosis no clubbing SKIN:  Warm and dry.  No rashes NEURO:  Alert and oriented x 3. Cranial nerves II through XII intact. PSYCH:  Cognitively intact    LABORATORY DATA: Lab Results  Component Value Date   WBC 5.2 11/17/2018   HGB 13.2 11/17/2018   HCT 40.2 11/17/2018   PLT 285 11/17/2018   GLUCOSE 72 06/20/2020   NA 141 06/20/2020   K 4.4 06/20/2020   CL 98 06/20/2020   CREATININE 0.92 06/20/2020   BUN 17 06/20/2020   CO2 28 06/20/2020   No results found for: CHOL, HDL, LDLCALC, LDLDIRECT, TRIG, CHOLHDL   Dated 08/20/16: cholesterol 146, triglycerides 127, HDL 35, LDL 86. Chemistries, TSH, CBC normal. Dated 12/30/18: cholesterol 128, triglycerides 98, HDL 33, LDL 75. A1c 5.1%. CMET, TSH, CBC normal Dated 02/24/17 A1c 5.2%. Dated 09/22/19: cholesterol 85, triglycerides 198, HDL 40, LDL 5. A1c 5.3%. CBC and TSH normal. Dated 01/30/20: cholesterol 147, triglycerides 204, HDL 41, LDL 65. Normal CBC, CMET Dated 11/27/20: A1c 5.4%   Ecg today  shows NSR rate 65 with first degree AV block. Otherwise normal. I have personally reviewed and interpreted this study.   Myoview 05/04/15: Study Highlights    The left ventricular ejection fraction is normal (55-65%).  Nuclear stress EF: 61%.  Blood pressure demonstrated a hypertensive response to exercise.  Horizontal ST segment depression ST segment depression of 2 mm was noted during stress in the III, aVF and II leads.  The study is normal.  This is a low risk study.   Low risk stress nuclear study with normal perfusion and normal left ventricular regional and global systolic function. Abnormal ECG response probably represents a "false positive" study, possibly related to hypertensive response to exercise.   Myoview 07/16/18: Study Highlights   Clinically negative, electrically positive for ischemia  Horizontal ST segment depression ST segment depression was noted during stress in the II, III and aVF leads, beginning at 6 minutes of stress, and returning to baseline after 1-5 minutes of recovery.  Hypertensive response  Normal perfusion.  LVEF 59%  Low risk scan   Echo 04/28/18: Study Conclusions  - Left ventricle: The cavity size was normal. Wall thickness was   normal. Systolic function was normal. The estimated ejection   fraction was in the range of 60% to 65%. Wall motion was normal;   there were no regional wall motion abnormalities. Doppler   parameters are consistent with abnormal left ventricular   relaxation (grade 1 diastolic dysfunction). - Mitral valve: Calcified annulus.  Impressions:  - Normal LV systolic function; mild diastolic dysfunction.  Procedures  CORONARY STENT INTERVENTION  LEFT HEART CATH AND CORONARY ANGIOGRAPHY  Conclusion    Prox RCA to Mid RCA lesion is 15% stenosed.  Previously placed Mid LAD stent (unknown type) is widely patent.  Ost 2nd Mrg lesion is 95% stenosed.  Post intervention, there is a 0% residual  stenosis.  A drug-eluting stent was successfully placed using a STENT RESOLUTE ONYX 2.5X12.  The left ventricular systolic function is normal.  LV end diastolic pressure is normal.  The left ventricular ejection fraction is 55-65% by visual estimate.   1. Single vessel obstructive CAD with 95% OM 2 lesion. Prior stents in the LAD and RCA are patent.  2. Normal LV function 3. Normal LVEDP 4. Successful PCI of the  OM2 with DES  Plan: DAPT for one year. Patient is a candidate for same day discharge.       Assessment / Plan: 1. Coronary disease. Status post stenting of the mid LAD and RCA in 2004 with DES. Cardiac catheterization in 2011 showed patent stents. Myoview in 2016 and November 2019 were normal. Presented with unstable angina in March 2020 with DES of OM branch. On DAPT for one year. May stop Plavix after March 3. He is asymptomatic.   2. HTN- well controlled on Norvasc, chlorthalidone and Altace.  3. Hypercholesterolemia. On Repatha and Zetia. Statin intolerance due to myalgias. Excellent response to Repatha with LDL 5.  4. Hypothyroidism.   I will follow up in 6 months.

## 2021-01-30 ENCOUNTER — Ambulatory Visit: Payer: Medicare Other | Admitting: Cardiology

## 2021-01-30 ENCOUNTER — Telehealth: Payer: Self-pay

## 2021-01-30 NOTE — Telephone Encounter (Signed)
Spoke to patient Dr.Jordan's appointment for this morning has been cancelled.Dr.Jordan will not be in office.Patient stated he is doing ok.Appointment rescheduled with Dr.Jordan 04/17/21 at 4:00 pm.Sooner appointment offered with extender,patient refused.

## 2021-02-04 ENCOUNTER — Telehealth: Payer: Self-pay | Admitting: Cardiology

## 2021-02-04 NOTE — Telephone Encounter (Signed)
Spoke to patient he stated his appointment with Dr.Jordan was cancelled last week.Stated he is having swelling and pain in both lower legs and ankles worse in the past 1 week.Stated hard to sleep at night due to pain.Dr.Jordan had a cancellation this Wed 5/18.Appointment scheduled at 10:20 am.

## 2021-02-04 NOTE — Telephone Encounter (Signed)
Pt state for the past 6 months he's had issue with swelling in his left ankle due to achilles issue and right ankle only intermittently. However, for the past week both have been swollen and only relieved a little with elevations. He denies SOB are any other symptoms but concerned. Pt also state he is traveling to [place] on 5/24-6/2 and wanted to make sure he is ok to travel.  Will forward to MD for recommendations.

## 2021-02-04 NOTE — Telephone Encounter (Signed)
  Pt c/o swelling: STAT is pt has developed SOB within 24 hours  1) How much weight have you gained and in what time span? Couple pounds  2) If swelling, where is the swelling located? Legs, ankles and feet  3) Are you currently taking a fluid pill? yes  4) Are you currently SOB? no  5) Do you have a log of your daily weights (if so, list)? no  6) Have you gained 3 pounds in a day or 5 pounds in a week? no  7) Have you traveled recently? No  Patient is going to be traveling to [place] 02/12/21-02/21/21 and would like to know if he is okay to travel

## 2021-02-05 NOTE — Progress Notes (Signed)
[name] Date of Birth: [birth year]   History of Present Illness: [name] is seen for followup of his coronary disease and leg swelling. He has a history of stenting of the mid LAD and mid right coronary in 2004 with DES. In 2011 he had a nuclear stress test. He was able to exercise for 8 minutes with significant ST segment changes. Perfusion imaging was normal. He did undergo repeat cardiac catheterization which showed nonobstructive disease and continued excellent patency of his stents. Repeat myoview in 2016 still showed ST segment changes but normal perfusion and normal EF.   Followed by Dr. Brett Fairy for evaluation of snoring and REM dependent apnea. Fitted for a dental device.   He was seen by Fabian Sharp in August 2019 for dyspnea. Echocardiogram obtained on 04/28/2018 demonstrated normal EF 60 to 65%, with grade 1 DD. He returned in October 2019 with worsening dyspnea. Myoview was repeated on 07/16/2018 which showed horizontal ST depression in inferior leads, hypertensive response, normal perfusion, EF 59%. He was seen on on 11/16/2018, at which time he continued to have significant left-sided chest pain with exertion.  Symptom was concerning for unstable angina.  He had cardiac catheterization which was performed on 11/23/2018, this revealed a 95% ostial OM 2 lesion treated with resolute Onyx 2.5 x 12 mm DES, 15% proximal to mid RCA residual, widely patent mid LAD stent.  On subsequent follow up was persistently hypertensive. Amlodipine increased and then started on chlorthalidone. On his last visit his ramipril was increased.   More recently he has complained of ankle swelling and pain. He states he had a left achilles tendon problem and was In a boot for a while. He had some pain and swelling in his left ankle. Over the past 2 weeks both legs are swollen and tender. No dyspnea or chest pain. No fever. Was seen by primary care yesterday. Labs ordered - CRP, sed rate, CK and BMET. Told to  reduce amlodipine to 5 mg daily and switch ramipril to Avapro 300 mg daily. He is planning to travel to [place] next week.    Current Outpatient Medications on File Prior to Visit  Medication Sig Dispense Refill  . aspirin 81 MG tablet Take 81 mg by mouth daily.    [name] buPROPion (WELLBUTRIN XL) 300 MG 24 hr tablet Take 300 mg by mouth daily.    . chlorthalidone (HYGROTON) 25 MG tablet TAKE 1 TABLET ONCE DAILY. 30 tablet 7  . cholecalciferol (VITAMIN D3) 25 MCG (1000 UT) tablet Take 1,000 Units by mouth daily.    . Coenzyme Q10 (COQ10) 100 MG CAPS Take 100 mg by mouth daily.    . cyanocobalamin (,VITAMIN B-12,) 1000 MCG/ML injection Inject 1,000 mcg into the muscle every 30 (thirty) days.    [name] ezetimibe (ZETIA) 10 MG tablet TAKE 1 TABLET ONCE DAILY. 90 tablet 1  . fluticasone (CUTIVATE) 0.05 % cream Apply 1 application topically daily as needed (rash).     [name] ketoconazole (NIZORAL) 2 % shampoo Apply 1 application topically 3 (three) times a week.     . levothyroxine (SYNTHROID, LEVOTHROID) 200 MCG tablet Take 200 mcg by mouth See admin instructions.     [name] REPATHA SURECLICK [id number] MG/ML SOAJ Inject 1 Syringe into the skin every 14 (fourteen) days.    . sildenafil (REVATIO) 20 MG tablet Take 80 mg by mouth daily as needed (ED).     [name] testosterone cypionate (DEPOTESTOSTERONE CYPIONATE) 200 MG/ML injection testosterone cypionate 200 mg/mL intramuscular oil    . [  DISCONTINUED] amLODipine (NORVASC) 10 MG tablet TAKE 1 TABLET ONCE DAILY. 90 tablet 0  . nitroGLYCERIN (NITROSTAT) 0.4 MG SL tablet Place 1 tablet (0.4 mg total) under the tongue every 5 (five) minutes as needed for chest pain. 25 tablet 3   No current facility-administered medications on file prior to visit.    No Known Allergies  Past Medical History:  Diagnosis Date  . Benign paroxysmal positional vertigo   . Coronary artery disease 2004   with prior stenting of the mid LAD and mid right coronary  / cardiac cath with satifsfactory  finding following abnormal stress test  . Depressive disorder, not elsewhere classified   . Gastritis   . Gluten enteropathy   . Heart palpitations    went to ER on 04/27/2006 for palpitations  . Hypercholesteremia   . Hypertension   . Hypothyroidism   . Iron deficiency anemia, unspecified   . Myalgia and myositis, unspecified   . Personal history of colonic polyps 10/2006   hyperplastic polyp  . SOB (shortness of breath)     Past Surgical History:  Procedure Laterality Date  . CARDIAC CATHETERIZATION  08/25/2003   EF estimated 65% / stenting of the mid left  anterior descending and mid right coronary artery in 2004 with drug-eluting stent/Two vessel obstructive atherosclerotic coronary artery disease. / Normal left ventricular function. / Successful stenting of the mid right coronary artery and the mid left anterior descending  . CARDIAC CATHETERIZATION  07/17/2010   EF estimated at 65% / Left ventricular angiography was performed in the RAO view  This demonstrates normal left ventricular size and contractility with normal  systolic function / Nonobstructive atherosclerotic coronary disease with continued  excellent patency of the stents in the mid LAD & mid right coronary artery / Normal left ventricular function  . COLONOSCOPY    . CORONARY STENT INTERVENTION N/A 11/23/2018   Procedure: CORONARY STENT INTERVENTION;  Surgeon: Martinique, Sabine Tenenbaum M, MD;  Location: [facility];  Service: Cardiovascular;  Laterality: N/A;  . LEFT HEART CATH AND CORONARY ANGIOGRAPHY N/A 11/23/2018   Procedure: LEFT HEART CATH AND CORONARY ANGIOGRAPHY;  Surgeon: Martinique, Mccartney Chuba M, MD;  Location: [facility];  Service: Cardiovascular;  Laterality: N/A;  . OSTEOTOMY     removal of bone spur on (L) arm  . POLYPECTOMY      Social History   Tobacco Use  Smoking Status Former Smoker  . Packs/day: 1.00  . Types: Cigarettes  . Quit date: 09/22/2000  . Years since quitting: 20.3  Smokeless Tobacco Never Used     Social History   Substance and Sexual Activity  Alcohol Use Yes  . Alcohol/week: 7.0 standard drinks  . Types: 7 Glasses of wine per week    Family History  Problem Relation Age of Onset  . Heart attack Father        x2  . Prostate cancer Father   . Pancreatic cancer Mother   . Stroke Brother   . Coronary artery disease Brother   . Coronary artery disease Brother   . Prostate cancer Paternal Uncle   . Diabetes Brother     Review of Systems: As noted in history of present illness. All other systems were reviewed and are negative.  Physical Exam: BP 134/82   Pulse 63   Ht 5\' 9"  (1.753 m)   Wt 224 lb (101.6 kg)   SpO2 99%   BMI 33.08 kg/m  GENERAL:  Well appearing, overweight WM HEENT:  PERRL, EOMI, sclera are clear. Oropharynx is clear. NECK:  No jugular venous distention, carotid upstroke brisk and symmetric, no bruits, no thyromegaly or adenopathy LUNGS:  Clear to auscultation bilaterally CHEST:  Unremarkable HEART:  RRR,  PMI not displaced or sustained,S1 and S2 within normal limits, no S3, no S4: no clicks, no rubs, no murmurs ABD:  Soft, nontender. BS +, no masses or bruits. No hepatomegaly, no splenomegaly EXT:  2 + pulses throughout, bilateral LE edema 2+, nontender.  no cyanosis no clubbing SKIN:  Warm and dry.  No rashes NEURO:  Alert and oriented x 3. Cranial nerves II through XII intact. PSYCH:  Cognitively intact    LABORATORY DATA: Lab Results  Component Value Date   WBC 5.2 11/17/2018   HGB 13.2 11/17/2018   HCT 40.2 11/17/2018   PLT 285 11/17/2018   GLUCOSE 72 06/20/2020   NA 141 06/20/2020   K 4.4 06/20/2020   CL 98 06/20/2020   CREATININE 0.92 06/20/2020   BUN 17 06/20/2020   CO2 28 06/20/2020   No results found for: CHOL, HDL, LDLCALC, LDLDIRECT, TRIG, CHOLHDL   Dated 08/20/16: cholesterol 146, triglycerides 127, HDL 35, LDL 86. Chemistries, TSH, CBC normal. Dated 12/30/18: cholesterol 128, triglycerides 98, HDL 33, LDL 75. A1c  5.1%. CMET, TSH, CBC normal Dated 02/24/17 A1c 5.2%. Dated 09/22/19: cholesterol 85, triglycerides 198, HDL 40, LDL 5. A1c 5.3%. CBC and TSH normal. Dated 01/30/20: cholesterol 147, triglycerides 204, HDL 41, LDL 65. Normal CBC, CMET Dated 11/27/20: A1c 5.4%   Ecg today shows NSR rate 63 with first degree AV block. Otherwise normal. I have personally reviewed and interpreted this study.   Myoview 05/04/15: Study Highlights    The left ventricular ejection fraction is normal (55-65%).  Nuclear stress EF: 61%.  Blood pressure demonstrated a hypertensive response to exercise.  Horizontal ST segment depression ST segment depression of 2 mm was noted during stress in the III, aVF and II leads.  The study is normal.  This is a low risk study.   Low risk stress nuclear study with normal perfusion and normal left ventricular regional and global systolic function. Abnormal ECG response probably represents a "false positive" study, possibly related to hypertensive response to exercise.   Myoview 07/16/18: Study Highlights   Clinically negative, electrically positive for ischemia  Horizontal ST segment depression ST segment depression was noted during stress in the II, III and aVF leads, beginning at 6 minutes of stress, and returning to baseline after 1-5 minutes of recovery.  Hypertensive response  Normal perfusion.  LVEF 59%  Low risk scan   Echo 04/28/18: Study Conclusions  - Left ventricle: The cavity size was normal. Wall thickness was   normal. Systolic function was normal. The estimated ejection   fraction was in the range of 60% to 65%. Wall motion was normal;   there were no regional wall motion abnormalities. Doppler   parameters are consistent with abnormal left ventricular   relaxation (grade 1 diastolic dysfunction). - Mitral valve: Calcified annulus.  Impressions:  - Normal LV systolic function; mild diastolic dysfunction.  Procedures  CORONARY STENT  INTERVENTION  LEFT HEART CATH AND CORONARY ANGIOGRAPHY  Conclusion    Prox RCA to Mid RCA lesion is 15% stenosed.  Previously placed Mid LAD stent (unknown type) is widely patent.  Ost 2nd Mrg lesion is 95% stenosed.  Post intervention, there is a 0% residual stenosis.  A drug-eluting stent was successfully placed using a STENT RESOLUTE ONYX 2.5X12.  The left ventricular systolic function is normal.  LV end diastolic pressure is normal.  The left ventricular ejection fraction is 55-65% by visual estimate.   1. Single vessel obstructive CAD with 95% OM 2 lesion. Prior stents in the LAD and RCA are patent.  2. Normal LV function 3. Normal LVEDP 4. Successful PCI of the OM2 with DES  Plan: DAPT for one year. Patient is a candidate for same day discharge.       Assessment / Plan: 1. Coronary disease. Status post stenting of the mid LAD and RCA in 2004 with DES. Cardiac catheterization in 2011 showed patent stents. Myoview in 2016 and November 2019 were normal. Presented with unstable angina in March 2020 with DES of OM branch.  He is asymptomatic. Ecg is stable.   2. HTN- noted changes in therapy as above. I am in agreement with this.   3. LE edema. Patient's weight is up 15 lbs. agree with reduction in amlodipine dose. await results of labs done yesterday. Will add lasix 20 mg daily. Restrict sodium intake. Will check LE venous dopplers to r/o DVT given recent achilles inflammation and inactivity. Also plan to repeat Echo.   4. Hypercholesterolemia. On Repatha and Zetia. Statin intolerance due to myalgias.   4. Hypothyroidism.   I will follow up in 2 months

## 2021-02-06 ENCOUNTER — Encounter: Payer: Self-pay | Admitting: Cardiology

## 2021-02-06 ENCOUNTER — Other Ambulatory Visit: Payer: Self-pay

## 2021-02-06 ENCOUNTER — Ambulatory Visit: Payer: Medicare Other | Admitting: Cardiology

## 2021-02-06 VITALS — BP 134/82 | HR 63 | Ht 69.0 in | Wt 224.0 lb

## 2021-02-06 DIAGNOSIS — I2510 Atherosclerotic heart disease of native coronary artery without angina pectoris: Secondary | ICD-10-CM

## 2021-02-06 DIAGNOSIS — I10 Essential (primary) hypertension: Secondary | ICD-10-CM

## 2021-02-06 DIAGNOSIS — E785 Hyperlipidemia, unspecified: Secondary | ICD-10-CM

## 2021-02-06 DIAGNOSIS — R600 Localized edema: Secondary | ICD-10-CM | POA: Diagnosis not present

## 2021-02-06 MED ORDER — AMLODIPINE BESYLATE 5 MG PO TABS: 5.0000 mg | ORAL_TABLET | Freq: Every day | ORAL | 3 refills | Status: DC

## 2021-02-06 MED ORDER — IRBESARTAN 300 MG PO TABS: 300.0000 mg | ORAL_TABLET | Freq: Every day | ORAL | Status: AC

## 2021-02-06 MED ORDER — FUROSEMIDE 20 MG PO TABS: 20.0000 mg | ORAL_TABLET | Freq: Every day | ORAL | 3 refills | Status: DC

## 2021-02-06 NOTE — Patient Instructions (Addendum)
Agree with switching ramipril to Avapro 300 mg daily and reducing amlodipine to 5 mg daily  Start lasix 20 mg daily for swelling until swelling gone then can use PRN  We will check lower extremity venous dopplers to rule out DVT.  We will schedule you for an Echocardiogram  You need to restrict your sodium intake.

## 2021-02-06 NOTE — Addendum Note (Signed)
Addended by: Kathyrn Lass on: 02/06/2021 10:47 AM   Modules accepted: Orders

## 2021-02-07 ENCOUNTER — Ambulatory Visit (HOSPITAL_COMMUNITY)
Admission: RE | Admit: 2021-02-07 | Discharge: 2021-02-07 | Disposition: A | Discharge status: Home / Self Care | Payer: Medicare Other | Source: Ambulatory Visit | Attending: Cardiology | Admitting: Cardiology

## 2021-02-07 DIAGNOSIS — R600 Localized edema: Secondary | ICD-10-CM | POA: Diagnosis present

## 2021-02-07 DIAGNOSIS — E785 Hyperlipidemia, unspecified: Secondary | ICD-10-CM | POA: Diagnosis present

## 2021-02-07 DIAGNOSIS — I10 Essential (primary) hypertension: Secondary | ICD-10-CM | POA: Insufficient documentation

## 2021-02-07 DIAGNOSIS — I2510 Atherosclerotic heart disease of native coronary artery without angina pectoris: Secondary | ICD-10-CM

## 2021-04-01 ENCOUNTER — Other Ambulatory Visit: Payer: Self-pay

## 2021-04-01 ENCOUNTER — Ambulatory Visit (HOSPITAL_COMMUNITY): Payer: Medicare Other | Attending: Cardiovascular Disease

## 2021-04-01 DIAGNOSIS — E785 Hyperlipidemia, unspecified: Secondary | ICD-10-CM | POA: Insufficient documentation

## 2021-04-01 DIAGNOSIS — R600 Localized edema: Secondary | ICD-10-CM

## 2021-04-01 DIAGNOSIS — I10 Essential (primary) hypertension: Secondary | ICD-10-CM | POA: Insufficient documentation

## 2021-04-01 DIAGNOSIS — I2510 Atherosclerotic heart disease of native coronary artery without angina pectoris: Secondary | ICD-10-CM | POA: Diagnosis present

## 2021-04-01 LAB — ECHOCARDIOGRAM COMPLETE
Area-P 1/2: 3.45 cm2
S' Lateral: 3 cm

## 2021-04-15 NOTE — Progress Notes (Signed)
[name] Date of Birth: [birth year]   History of Present Illness: [name] is seen for followup of his coronary disease and leg swelling. He has a history of stenting of the mid LAD and mid right coronary in 2004 with DES. In 2011 he had a nuclear stress test. He was able to exercise for 8 minutes with significant ST segment changes. Perfusion imaging was normal. He did undergo repeat cardiac catheterization which showed nonobstructive disease and continued excellent patency of his stents. Repeat myoview in 2016 still showed ST segment changes but normal perfusion and normal EF.   Followed by Dr. Brett Fairy for evaluation of snoring and REM dependent apnea. Fitted for a dental device.   He was seen by Fabian Sharp in August 2019 for dyspnea.  Echocardiogram obtained on 04/28/2018 demonstrated normal EF 60 to 65%, with grade 1 DD.  He returned in October 2019 with worsening dyspnea.  Myoview was repeated on 07/16/2018 which showed horizontal ST depression in inferior leads, hypertensive response, normal perfusion, EF 59%. He was seen on on 11/16/2018, at which time he continued to have significant left-sided chest pain with exertion.  Symptom was concerning for unstable angina.  He had cardiac catheterization which was performed on 11/23/2018, this revealed a 95% ostial OM 2 lesion treated with resolute Onyx 2.5 x 12 mm DES, 15% proximal to mid RCA residual, widely patent mid LAD stent.  On subsequent follow up was persistently hypertensive. Amlodipine increased and then started on chlorthalidone. On his last visit his ramipril was increased.   On his last visit he  complained of ankle swelling and pain. He states he had a left achilles tendon problem and was In a boot for a while. He had some pain and swelling in his left ankle. Over the past 2 weeks both legs are swollen and tender. No dyspnea or chest pain. No fever.  Labs ordered - CRP, sed rate, CK and BMET. Told to reduce amlodipine to 5 mg daily  and switch ramipril to Avapro 300 mg daily.  We gave him lasix to take. LE venous dopplers negative for DVT. Echo was unremarkable.   He did go on a river cruise on [place]. Did catch Covid on the way back and was treated with Paxlovid. Ankle swelling has resolved but is taking lasix about every 2 weeks. Feels well. No chest pain.   Current Outpatient Medications on File Prior to Visit  Medication Sig Dispense Refill   amLODipine (NORVASC) 5 MG tablet Take 1 tablet (5 mg total) by mouth daily. 90 tablet 3   aspirin 81 MG tablet Take 81 mg by mouth daily.     buPROPion (WELLBUTRIN XL) 300 MG 24 hr tablet Take 300 mg by mouth daily.     chlorthalidone (HYGROTON) 25 MG tablet TAKE 1 TABLET ONCE DAILY. 30 tablet 7   cholecalciferol (VITAMIN D3) 25 MCG (1000 UT) tablet Take 1,000 Units by mouth daily.     Coenzyme Q10 (COQ10) 100 MG CAPS Take 100 mg by mouth daily.     cyanocobalamin (,VITAMIN B-12,) 1000 MCG/ML injection Inject 1,000 mcg into the muscle every 30 (thirty) days.     ezetimibe (ZETIA) 10 MG tablet TAKE 1 TABLET ONCE DAILY. 90 tablet 1   fluticasone (CUTIVATE) 0.05 % cream Apply 1 application topically daily as needed (rash).      irbesartan (AVAPRO) 300 MG tablet Take 1 tablet (300 mg total) by mouth daily.     ketoconazole (NIZORAL) 2 % shampoo  Apply 1 application topically 3 (three) times a week.      levothyroxine (SYNTHROID, LEVOTHROID) 200 MCG tablet Take 200 mcg by mouth See admin instructions.      REPATHA SURECLICK [id number] MG/ML SOAJ Inject 1 Syringe into the skin every 14 (fourteen) days.     sildenafil (REVATIO) 20 MG tablet Take 80 mg by mouth daily as needed (ED).      testosterone cypionate (DEPOTESTOSTERONE CYPIONATE) 200 MG/ML injection testosterone cypionate 200 mg/mL intramuscular oil     nitroGLYCERIN (NITROSTAT) 0.4 MG SL tablet Place 1 tablet (0.4 mg total) under the tongue every 5 (five) minutes as needed for chest pain. 25 tablet 3   No current  facility-administered medications on file prior to visit.    No Known Allergies  Past Medical History:  Diagnosis Date   Benign paroxysmal positional vertigo    Coronary artery disease 2004   with prior stenting of the mid LAD and mid right coronary  / cardiac cath with satifsfactory finding following abnormal stress test   Depressive disorder, not elsewhere classified    Gastritis    Gluten enteropathy    Heart palpitations    went to ER on 04/27/2006 for palpitations   Hypercholesteremia    Hypertension    Hypothyroidism    Iron deficiency anemia, unspecified    Myalgia and myositis, unspecified    Personal history of colonic polyps 10/2006   hyperplastic polyp   SOB (shortness of breath)     Past Surgical History:  Procedure Laterality Date   CARDIAC CATHETERIZATION  08/25/2003   EF estimated 65% / stenting of the mid left  anterior descending and mid right coronary artery in 2004 with drug-eluting stent/Two vessel obstructive atherosclerotic coronary artery disease. / Normal left ventricular function. / Successful stenting of the mid right coronary artery and the mid left anterior descending   CARDIAC CATHETERIZATION  07/17/2010   EF estimated at 65% / Left ventricular angiography was performed in the RAO view  This demonstrates normal left ventricular size and contractility with normal  systolic function / Nonobstructive atherosclerotic coronary disease with continued  excellent patency of the stents in the mid LAD & mid right coronary artery / Normal left ventricular function   COLONOSCOPY     CORONARY STENT INTERVENTION N/A 11/23/2018   Procedure: CORONARY STENT INTERVENTION;  Surgeon: Martinique, Charmine Bockrath M, MD;  Location: [facility];  Service: Cardiovascular;  Laterality: N/A;   LEFT HEART CATH AND CORONARY ANGIOGRAPHY N/A 11/23/2018   Procedure: LEFT HEART CATH AND CORONARY ANGIOGRAPHY;  Surgeon: Martinique, Matheau Orona M, MD;  Location: [facility];  Service: Cardiovascular;   Laterality: N/A;   OSTEOTOMY     removal of bone spur on (L) arm   POLYPECTOMY      Social History   Tobacco Use  Smoking Status Former   Packs/day: 1.00   Types: Cigarettes   Quit date: 09/22/2000   Years since quitting: 20.5  Smokeless Tobacco Never    Social History   Substance and Sexual Activity  Alcohol Use Yes   Alcohol/week: 7.0 standard drinks   Types: 7 Glasses of wine per week    Family History  Problem Relation Age of Onset   Heart attack Father        x2   Prostate cancer Father    Pancreatic cancer Mother    Stroke Brother    Coronary artery disease Brother    Coronary artery disease Brother    Prostate cancer  Paternal Uncle    Diabetes Brother     Review of Systems: As noted in history of present illness. All other systems were reviewed and are negative.  Physical Exam: BP 120/78 (BP Location: Left Arm)   Pulse 80   Ht '5\' 9"'$  (1.753 m)   Wt 228 lb (103.4 kg)   SpO2 98%   BMI 33.67 kg/m  GENERAL:  Well appearing, overweight WM HEENT:  PERRL, EOMI, sclera are clear. Oropharynx is clear. NECK:  No jugular venous distention, carotid upstroke brisk and symmetric, no bruits, no thyromegaly or adenopathy LUNGS:  Clear to auscultation bilaterally CHEST:  Unremarkable HEART:  RRR,  PMI not displaced or sustained,S1 and S2 within normal limits, no S3, no S4: no clicks, no rubs, no murmurs ABD:  Soft, nontender. BS +, no masses or bruits. No hepatomegaly, no splenomegaly EXT:  2 + pulses throughout, no edema. no cyanosis no clubbing SKIN:  Warm and dry.  No rashes NEURO:  Alert and oriented x 3. Cranial nerves II through XII intact. PSYCH:  Cognitively intact    LABORATORY DATA: Lab Results  Component Value Date   WBC 5.2 11/17/2018   HGB 13.2 11/17/2018   HCT 40.2 11/17/2018   PLT 285 11/17/2018   GLUCOSE 72 06/20/2020   NA 141 06/20/2020   K 4.4 06/20/2020   CL 98 06/20/2020   CREATININE 0.92 06/20/2020   BUN 17 06/20/2020   CO2 28  06/20/2020   No results found for: CHOL, HDL, LDLCALC, LDLDIRECT, TRIG, CHOLHDL   Dated 08/20/16: cholesterol 146, triglycerides 127, HDL 35, LDL 86. Chemistries, TSH, CBC normal. Dated 12/30/18: cholesterol 128, triglycerides 98, HDL 33, LDL 75. A1c 5.1%. CMET, TSH, CBC normal Dated 02/24/17 A1c 5.2%. Dated 09/22/19: cholesterol 85, triglycerides 198, HDL 40, LDL 5. A1c 5.3%. CBC and TSH normal. Dated 01/30/20: cholesterol 147, triglycerides 204, HDL 41, LDL 65. Normal CBC, CMET Dated 11/27/20: A1c 5.4%   Myoview 05/04/15: Study Highlights   The left ventricular ejection fraction is normal (55-65%). Nuclear stress EF: 61%. Blood pressure demonstrated a hypertensive response to exercise. Horizontal ST segment depression ST segment depression of 2 mm was noted during stress in the III, aVF and II leads. The study is normal. This is a low risk study.   Low risk stress nuclear study with normal perfusion and normal left ventricular regional and global systolic function. Abnormal ECG response probably represents a "false positive" study, possibly related to hypertensive response to exercise.   Myoview 07/16/18: Study Highlights  Clinically negative, electrically positive for ischemia Horizontal ST segment depression ST segment depression was noted during stress in the II, III and aVF leads, beginning at 6 minutes of stress, and returning to baseline after 1-5 minutes of recovery. Hypertensive response Normal perfusion. LVEF 59% Low risk scan   Echo 04/28/18: Study Conclusions   - Left ventricle: The cavity size was normal. Wall thickness was   normal. Systolic function was normal. The estimated ejection   fraction was in the range of 60% to 65%. Wall motion was normal;   there were no regional wall motion abnormalities. Doppler   parameters are consistent with abnormal left ventricular   relaxation (grade 1 diastolic dysfunction). - Mitral valve: Calcified annulus.   Impressions:   -  Normal LV systolic function; mild diastolic dysfunction.  Procedures  CORONARY STENT INTERVENTION  LEFT HEART CATH AND CORONARY ANGIOGRAPHY  Conclusion    Prox RCA to Mid RCA lesion is 15% stenosed. Previously placed Mid LAD  stent (unknown type) is widely patent. Ost 2nd Mrg lesion is 95% stenosed. Post intervention, there is a 0% residual stenosis. A drug-eluting stent was successfully placed using a STENT RESOLUTE ONYX 2.5X12. The left ventricular systolic function is normal. LV end diastolic pressure is normal. The left ventricular ejection fraction is 55-65% by visual estimate.   1. Single vessel obstructive CAD with 95% OM 2 lesion. Prior stents in the LAD and RCA are patent.  2. Normal LV function 3. Normal LVEDP 4. Successful PCI of the OM2 with DES   Plan: DAPT for one year. Patient is a candidate for same day discharge.       Echo 04/01/21: IMPRESSIONS     1. Left ventricular ejection fraction, by estimation, is 55 to 60%. The  left ventricle has normal function. The left ventricle has no regional  wall motion abnormalities. There is mild left ventricular hypertrophy.  Left ventricular diastolic parameters  were normal.   2. Right ventricular systolic function is normal. The right ventricular  size is normal. There is normal pulmonary artery systolic pressure.   3. The mitral valve is normal in structure. No evidence of mitral valve  regurgitation. No evidence of mitral stenosis.   4. The aortic valve is tricuspid. Aortic valve regurgitation is not  visualized. Mild aortic valve sclerosis is present, with no evidence of  aortic valve stenosis.   5. The inferior vena cava is normal in size with greater than 50%  respiratory variability, suggesting right atrial pressure of 3 mmHg.    Assessment / Plan: 1. Coronary disease. Status post stenting of the mid LAD and RCA in 2004 with DES. Cardiac catheterization in 2011 showed patent stents. Myoview in 2016 and  November 2019 were normal. Presented with unstable angina in March 2020 with DES of OM branch.  He is asymptomatic. Ecg is stable.   2. HTN- well controlled.   3. LE edema. Resolved. Echo and LE dopplers negative. May have been related to higher amlodipine dose. Continue prn lasix.  4. Hypercholesterolemia. On Repatha and Zetia. Statin intolerance due to myalgias.   4. Hypothyroidism.   I will follow up in one year

## 2021-04-17 ENCOUNTER — Ambulatory Visit: Payer: Medicare Other | Admitting: Cardiology

## 2021-04-17 ENCOUNTER — Other Ambulatory Visit: Payer: Self-pay

## 2021-04-17 ENCOUNTER — Encounter: Payer: Self-pay | Admitting: Cardiology

## 2021-04-17 VITALS — BP 120/78 | HR 80 | Ht 69.0 in | Wt 228.0 lb

## 2021-04-17 DIAGNOSIS — E785 Hyperlipidemia, unspecified: Secondary | ICD-10-CM | POA: Diagnosis not present

## 2021-04-17 DIAGNOSIS — I10 Essential (primary) hypertension: Secondary | ICD-10-CM

## 2021-04-17 DIAGNOSIS — I2510 Atherosclerotic heart disease of native coronary artery without angina pectoris: Secondary | ICD-10-CM | POA: Diagnosis not present

## 2021-04-17 DIAGNOSIS — R600 Localized edema: Secondary | ICD-10-CM | POA: Diagnosis not present

## 2021-04-30 ENCOUNTER — Encounter: Payer: Self-pay | Admitting: Gastroenterology

## 2021-07-01 NOTE — Progress Notes (Deleted)
Office Visit Note  Patient: [name]             Date of Birth: Mar 28, 1951           MRN: [id number]             PCP: Crist Infante, MD Referring: Crist Infante, MD Visit Date: 07/02/2021 Occupation: @GUAROCC @  Subjective:  No chief complaint on file.   History of Present Illness: [name] is a 70 y.o. male ***   Activities of Daily Living:  Patient reports morning stiffness for *** {minute/hour:19697}.   Patient {ACTIONS;DENIES/REPORTS:21021675::"Denies"} nocturnal pain.  Difficulty dressing/grooming: {ACTIONS;DENIES/REPORTS:21021675::"Denies"} Difficulty climbing stairs: {ACTIONS;DENIES/REPORTS:21021675::"Denies"} Difficulty getting out of chair: {ACTIONS;DENIES/REPORTS:21021675::"Denies"} Difficulty using hands for taps, buttons, cutlery, and/or writing: {ACTIONS;DENIES/REPORTS:21021675::"Denies"}  No Rheumatology ROS completed.   PMFS History:  Patient Active Problem List   Diagnosis Date Noted   Angina pectoris ([place]) 11/23/2018   OSA (obstructive sleep apnea) 12/29/2016   Obesity (BMI 30-39.9) 12/29/2016   Snoring 12/29/2016   Acute allergic rhinitis due to pollen 12/29/2016   Iron deficiency anemia 05/06/2011   PA (pernicious anemia) 05/06/2011   Iron deficiency anemia, unspecified 03/11/2011   Hypertension    Coronary artery disease    Hypercholesteremia    Hypothyroidism     Past Medical History:  Diagnosis Date   Benign paroxysmal positional vertigo    Coronary artery disease 2004   with prior stenting of the mid LAD and mid right coronary  / cardiac cath with satifsfactory finding following abnormal stress test   Depressive disorder, not elsewhere classified    Gastritis    Gluten enteropathy    Heart palpitations    went to ER on 04/27/2006 for palpitations   Hypercholesteremia    Hypertension    Hypothyroidism    Iron deficiency anemia, unspecified    Myalgia and myositis, unspecified    Personal history of colonic polyps 10/2006    hyperplastic polyp   SOB (shortness of breath)     Family History  Problem Relation Age of Onset   Heart attack Father        x2   Prostate cancer Father    Pancreatic cancer Mother    Stroke Brother    Coronary artery disease Brother    Coronary artery disease Brother    Prostate cancer Paternal Uncle    Diabetes Brother    Past Surgical History:  Procedure Laterality Date   CARDIAC CATHETERIZATION  08/25/2003   EF estimated 65% / stenting of the mid left  anterior descending and mid right coronary artery in 2004 with drug-eluting stent/Two vessel obstructive atherosclerotic coronary artery disease. / Normal left ventricular function. / Successful stenting of the mid right coronary artery and the mid left anterior descending   CARDIAC CATHETERIZATION  07/17/2010   EF estimated at 65% / Left ventricular angiography was performed in the RAO view  This demonstrates normal left ventricular size and contractility with normal  systolic function / Nonobstructive atherosclerotic coronary disease with continued  excellent patency of the stents in the mid LAD & mid right coronary artery / Normal left ventricular function   COLONOSCOPY     CORONARY STENT INTERVENTION N/A 11/23/2018   Procedure: CORONARY STENT INTERVENTION;  Surgeon: Martinique, Peter M, MD;  Location: [facility];  Service: Cardiovascular;  Laterality: N/A;   LEFT HEART CATH AND CORONARY ANGIOGRAPHY N/A 11/23/2018   Procedure: LEFT HEART CATH AND CORONARY ANGIOGRAPHY;  Surgeon: Martinique, Peter M, MD;  Location: [facility];  Service: Cardiovascular;  Laterality: N/A;   OSTEOTOMY     removal of bone spur on (L) arm   POLYPECTOMY     Social History   Social History Narrative   Not on file   Immunization History  Administered Date(s) Administered   PFIZER(Purple Top)SARS-COV-2 Vaccination 10/11/2019, 11/01/2019, 07/17/2020     Objective: Vital Signs: There were no vitals taken for this visit.   Physical Exam    Musculoskeletal Exam: ***  CDAI Exam: CDAI Score: -- Patient Global: --; Provider Global: -- Swollen: --; Tender: -- Joint Exam 07/02/2021   No joint exam has been documented for this visit   There is currently no information documented on the homunculus. Go to the Rheumatology activity and complete the homunculus joint exam.  Investigation: No additional findings.  Imaging: No results found.  Recent Labs: Lab Results  Component Value Date   WBC 5.2 11/17/2018   HGB 13.2 11/17/2018   PLT 285 11/17/2018   NA 141 06/20/2020   K 4.4 06/20/2020   CL 98 06/20/2020   CO2 28 06/20/2020   GLUCOSE 72 06/20/2020   BUN 17 06/20/2020   CREATININE 0.92 06/20/2020   CALCIUM 9.4 06/20/2020   GFRAA 99 06/20/2020    Speciality Comments: No specialty comments available.  Procedures:  No procedures performed Allergies: Patient has no known allergies.   Assessment / Plan:     Visit Diagnoses: No diagnosis found.  Orders: No orders of the defined types were placed in this encounter.  No orders of the defined types were placed in this encounter.   Face-to-face time spent with patient was *** minutes. Greater than 50% of time was spent in counseling and coordination of care.  Follow-Up Instructions: No follow-ups on file.   [name] Salina, MD  Note - This record has been created using [employer].  Chart creation errors have been sought, but may not always  have been located. Such creation errors do not reflect on  the standard of medical care.

## 2021-07-02 ENCOUNTER — Ambulatory Visit: Payer: Medicare Other | Admitting: Internal Medicine

## 2021-07-16 DIAGNOSIS — M545 Low back pain, unspecified: Secondary | ICD-10-CM | POA: Insufficient documentation

## 2021-07-21 NOTE — Progress Notes (Signed)
Office Visit Note  Patient: [name]             Date of Birth: [birth year]           MRN: [id number]             PCP: Crist Infante, MD Referring: Crist Infante, MD Visit Date: 07/22/2021 Occupation: Retired, finance  Subjective:  New Patient (Initial Visit) (Total body aching)   History of Present Illness: [name] is a 70 y.o. male here for evaluation with elevated CK with muscle and body aches.  He has had some generalized joint pains ongoing for years.  Previous joint problems including cervical and lumbar spine degenerative disc disease demonstrated on previous imaging.  He has had past right shoulder arthroscopy for rotator cuff injury left shoulder with some similar joint pains but without any procedures or treatments required so far.  Trigger finger symptoms in the hands have been treated conservatively with splinting.  He has a lot of trouble with sleep only getting about 2 to 4 hours of quality sleep per night typically resting in a chair due to shoulder pain with lying flat in bed.  During the past 1 year he has noticed some new or increasing problems.  He has numbness in the fingers and toes not associated with visible changes not frequently dropping items or falling.  He has had inflammation or pain of the left Achilles tendon that causes pain when walking also aching at rest at nighttime.  He has had stiffness in the finger joints of both hands particularly worse with tightly closing his grip.  Overall his physical activity is greatly reduced due to these areas of pain that worsens with use, he previously was very physically active in swimming.  He takes Tylenol for pain which is mildly helpful.  He is unable to take NSAIDs regularly due to cardiovascular disease.   Labs reviewed CK 529 ANA neg RF neg CCP 14 ESR 1 CRP 1  Activities of Daily Living:  Patient reports morning stiffness for 12 hours.   Patient Reports nocturnal pain.  Difficulty dressing/grooming:  Reports Difficulty climbing stairs: Denies Difficulty getting out of chair: Denies Difficulty using hands for taps, buttons, cutlery, and/or writing: Reports  Review of Systems  Constitutional:  Positive for fatigue.  HENT:  Positive for mouth dryness.   Eyes:  Positive for dryness.  Respiratory:  Negative for shortness of breath.   Cardiovascular:  Positive for swelling in legs/feet.  Gastrointestinal:  Negative for constipation.  Endocrine: Positive for increased urination.  Genitourinary:  Positive for difficulty urinating.  Musculoskeletal:  Positive for joint pain, joint pain, joint swelling, muscle weakness, morning stiffness and muscle tenderness.  Skin:  Negative for rash.  Allergic/Immunologic: Negative for susceptible to infections.  Neurological:  Positive for numbness and weakness.  Hematological:  Positive for bruising/bleeding tendency.  Psychiatric/Behavioral:  Positive for sleep disturbance.    PMFS History:  Patient Active Problem List   Diagnosis Date Noted   Edema of lower extremity 07/22/2021   Groin pain 07/22/2021   Primary localized osteoarthritis of pelvic region and thigh 07/22/2021   Elevated CK 07/22/2021   Pain in left shoulder 07/22/2021   Lumbar pain 07/16/2021   Disorder of the skin and subcutaneous tissue, unspecified 02/06/2020   Pain in unspecified ankle and joints of unspecified foot 09/14/2019   Tendonitis, Achilles, left 03/15/2019   Achilles bursitis 01/10/2019   Angina pectoris ([place]) 11/23/2018   Insomnia 05/14/2018   Dysfunction  of left eustachian tube 04/02/2017   Hearing loss 04/02/2017   S/P left knee arthroscopy 01/20/2017   OSA (obstructive sleep apnea) 12/29/2016   Obesity (BMI 30-39.9) 12/29/2016   Acute allergic rhinitis due to pollen 12/29/2016   Chronic pain of left knee 12/17/2016   Celiac disease 12/02/2012   Testicular hypofunction 12/24/2011   Fatigue 11/25/2011   Impaired fasting glucose 06/12/2011   Vitamin B  deficiency 06/12/2011   Proteinuria 02/18/2011   Hypertension    Coronary artery disease    Hypercholesteremia    Hypothyroidism    Myalgia 01/27/2011   Major depression, single episode 01/27/2011   Benign paroxysmal positional vertigo 11/20/2009    Past Medical History:  Diagnosis Date   Benign paroxysmal positional vertigo    Coronary artery disease 2004   with prior stenting of the mid LAD and mid right coronary  / cardiac cath with satifsfactory finding following abnormal stress test   Depressive disorder, not elsewhere classified    Gastritis    Gluten enteropathy    Heart palpitations    went to ER on 04/27/2006 for palpitations   Hypercholesteremia    Hypertension    Hypothyroidism    Iron deficiency anemia, unspecified    Myalgia and myositis, unspecified    Personal history of colonic polyps 10/2006   hyperplastic polyp   SOB (shortness of breath)     Family History  Problem Relation Age of Onset   Pancreatic cancer Mother    Heart attack Father        x2   Prostate cancer Father    Stroke Brother    Coronary artery disease Brother    Diabetes Brother    Coronary artery disease Brother    Prostate cancer Paternal Uncle    Past Surgical History:  Procedure Laterality Date   CARDIAC CATHETERIZATION  08/25/2003   EF estimated 65% / stenting of the mid left  anterior descending and mid right coronary artery in 2004 with drug-eluting stent/Two vessel obstructive atherosclerotic coronary artery disease. / Normal left ventricular function. / Successful stenting of the mid right coronary artery and the mid left anterior descending   CARDIAC CATHETERIZATION  07/17/2010   EF estimated at 65% / Left ventricular angiography was performed in the RAO view  This demonstrates normal left ventricular size and contractility with normal  systolic function / Nonobstructive atherosclerotic coronary disease with continued  excellent patency of the stents in the mid LAD & mid right  coronary artery / Normal left ventricular function   COLONOSCOPY     CORONARY STENT INTERVENTION N/A 11/23/2018   Procedure: CORONARY STENT INTERVENTION;  Surgeon: Martinique, Peter M, MD;  Location: [facility];  Service: Cardiovascular;  Laterality: N/A;   LEFT HEART CATH AND CORONARY ANGIOGRAPHY N/A 11/23/2018   Procedure: LEFT HEART CATH AND CORONARY ANGIOGRAPHY;  Surgeon: Martinique, Peter M, MD;  Location: [facility];  Service: Cardiovascular;  Laterality: N/A;   MENISCUS REPAIR Left    OSTEOTOMY     removal of bone spur on (L) arm   POLYPECTOMY     ROTATOR CUFF REPAIR Right    Social History   Social History Narrative   Not on file   Immunization History  Administered Date(s) Administered   Influenza Split 01/03/2010, 06/12/2011, 06/03/2012, 07/20/2014   Influenza, Quadrivalent, Recombinant, Inj, Pf 05/14/2018, 05/18/2019, 06/22/2020   Influenza,inj,Quad PF,6+ Mos 07/01/2013, 07/20/2014, 08/09/2015, 08/26/2016   PFIZER(Purple Top)SARS-COV-2 Vaccination 10/11/2019, 11/01/2019, 07/17/2020   Pneumococcal Conjugate-13 01/04/2014  Pneumococcal Polysaccharide-23 11/20/2009, 01/03/2010, 12/02/2012   Td,absorbed, Preservative Free, Adult Use, Lf Unspecified 11/30/2007, 06/03/2012   Tdap 11/20/2009, 01/03/2010, 05/26/2012, 05/18/2018   Zoster, Live 11/20/2009, 01/03/2010     Objective: Vital Signs: BP 111/64 (BP Location: Right Arm, Patient Position: Sitting, Cuff Size: Normal)   Pulse 67   Resp 16   Ht $R'5\' 9"'iW$  (1.753 m)   Wt 230 lb (104.3 kg)   BMI 33.97 kg/m    Physical Exam Constitutional:      Appearance: He is obese.  Eyes:     Conjunctiva/sclera: Conjunctivae normal.  Cardiovascular:     Rate and Rhythm: Normal rate and regular rhythm.  Pulmonary:     Effort: Pulmonary effort is normal.     Breath sounds: Normal breath sounds.  Musculoskeletal:     Comments: Trace pitting pedal edema bilaterally  Skin:    General: Skin is warm and dry.     Findings: No  rash.  Neurological:     General: No focal deficit present.     Mental Status: He is alert.     Deep Tendon Reflexes: Reflexes normal.  Psychiatric:     Comments: Somewhat flat affect, fatigued     Musculoskeletal Exam:  Neck full ROM no tenderness Right shoulder full ROM, left shoulder pain with abduction horizontal and above, pain with internal rotation while partially abducted, flexion and extension okay Elbows full ROM no tenderness or swelling Wrists full ROM no tenderness or swelling Fingers full ROM no tenderness or swelling Hip rotation slightly decreased, SLR limited very tight hamstrings Knees full ROM no tenderness or swelling Ankles right achilles tenderness    Investigation: No additional findings.  Imaging: No results found.  Recent Labs: Lab Results  Component Value Date   WBC 5.2 11/17/2018   HGB 13.2 11/17/2018   PLT 285 11/17/2018   NA 141 06/20/2020   K 4.4 06/20/2020   CL 98 06/20/2020   CO2 28 06/20/2020   GLUCOSE 72 06/20/2020   BUN 17 06/20/2020   CREATININE 0.92 06/20/2020   CALCIUM 9.4 06/20/2020   GFRAA 99 06/20/2020    Speciality Comments: No specialty comments available.  Procedures:  No procedures performed Allergies: Atorvastatin   Assessment / Plan:     Visit Diagnoses: Elevated CK - Plan: CK, Aldolase, TSH  Increase CK with muscle and body pains in multiple sites.  No strength deficit or focal muscle group tenderness or edema appreciated on exam.  We will recheck this to see if there is any progression also serum aldolase and rechecking thyroid levels. If worsening can work up more extensively for myositis but lower pretest suspicion.  Hypothyroidism, unspecified type Testicular hypofunction  History of low thyroid function and testosterone he is on levothyroxine without any particular recent change in systemic symptoms.  We will recheck TSH due to common cause of myalgias or mild CK elevation.  Chronic left shoulder  pain  Rotator cuff arthropathy of the left shoulder there is no gross strength deficit at this time.  Lumbar pain  Degenerative disc disease at multiple levels he has already seen orthopedics and following up for this problem.  Does not describe typical inflammatory back pain.  Orders: Orders Placed This Encounter  Procedures   CK   Aldolase   TSH    No orders of the defined types were placed in this encounter.    Follow-Up Instructions: No follow-ups on file.   [name] Salina, MD  Note - This record has been created  using [employer].  Chart creation errors have been sought, but may not always  have been located. Such creation errors do not reflect on  the standard of medical care.

## 2021-07-22 ENCOUNTER — Ambulatory Visit: Payer: Medicare Other | Admitting: Internal Medicine

## 2021-07-22 ENCOUNTER — Encounter: Payer: Self-pay | Admitting: Internal Medicine

## 2021-07-22 ENCOUNTER — Other Ambulatory Visit: Payer: Self-pay

## 2021-07-22 VITALS — BP 111/64 | HR 67 | Resp 16 | Ht 69.0 in | Wt 230.0 lb

## 2021-07-22 DIAGNOSIS — E039 Hypothyroidism, unspecified: Secondary | ICD-10-CM

## 2021-07-22 DIAGNOSIS — G8929 Other chronic pain: Secondary | ICD-10-CM

## 2021-07-22 DIAGNOSIS — R600 Localized edema: Secondary | ICD-10-CM | POA: Insufficient documentation

## 2021-07-22 DIAGNOSIS — M25512 Pain in left shoulder: Secondary | ICD-10-CM

## 2021-07-22 DIAGNOSIS — R748 Abnormal levels of other serum enzymes: Secondary | ICD-10-CM | POA: Insufficient documentation

## 2021-07-22 DIAGNOSIS — M1610 Unilateral primary osteoarthritis, unspecified hip: Secondary | ICD-10-CM | POA: Insufficient documentation

## 2021-07-22 DIAGNOSIS — R1030 Lower abdominal pain, unspecified: Secondary | ICD-10-CM | POA: Insufficient documentation

## 2021-07-22 DIAGNOSIS — E291 Testicular hypofunction: Secondary | ICD-10-CM | POA: Diagnosis not present

## 2021-07-22 DIAGNOSIS — M545 Low back pain, unspecified: Secondary | ICD-10-CM

## 2021-07-22 NOTE — Patient Instructions (Signed)
Aldolase Test Why am I having this test? An aldolase test is used to help diagnose and monitor disorders that affect the muscles or liver. What is being tested? This test measures aldolase in your blood. Aldolase is a natural chemical (enzyme) that is found in most tissues of the body. It helps to convert blood sugar (glucose) into energy. The amount of aldolase in the blood increases when there is muscle or liver damage. Many different medicines can also cause increased levels of this enzyme. What kind of sample is taken? A blood sample is required for this test. It is usually collected by inserting a needle into a blood vessel. How do I prepare for this test? Follow instructions from your health care provider about eating or drinking restrictions. You may need to stop eating or drinking (may need to fast) for a short period of time before the test to get more accurate results. On the day before the test and the day of the test, avoid activities and exercises that take a lot of effort. Tell a health care provider about: All medicines you are taking, including vitamins, herbs, eye drops, creams, and over-the-counter medicines. Any medical conditions you have. How are the results reported? Your test results will be reported as values. Your health care provider will compare your results to normal ranges that were established after testing a large group of people (reference ranges). Reference ranges may vary among labs and hospitals. For this test, common reference ranges are: Adult: 1-7.5 units/L. Children: Newborn to 30 days: 6-32 units/L. Age 33 month to 6 years: 3-12 units/L. Age 33-17 years: 3.3-9.7 units/L. What do the results mean? If results are within the reference range, they are considered normal.  Increased levels of aldolase may be a sign of: A muscular disease, such as muscular dystrophy, dermatomyositis, or polymyositis. Muscle injury. Liver disease. Heart attack. Anemia. An  infection, such as trichinosis or mononucleosis. Decreased levels of aldolase may be a sign of: Muscle-wasting diseases. Being unable to absorb a natural sugar that is in things like fruit, fruit juices, and honey (hereditary fructose intolerance). Late-stage muscular dystrophy (MD). Talk with your health care provider about what your results mean. Questions to ask your health care provider Ask your health care provider, or the department that is doing the test: When will my results be ready? How will I get my results? What are my treatment options? What other tests do I need? What are my next steps? Summary An aldolase test is used to help diagnose and monitor disorders that affect the muscles or liver. Aldolase is a natural chemical (enzyme) that is found in most tissues of the body. The amount of aldolase in the blood increases when there is muscle or liver damage. For this test, a blood sample is usually collected by inserting a needle into a blood vessel. Talk with your health care provider about what your results mean.  Creatine Kinase Test Why am I having this test? The creatine kinase (CK) test is done to check for damage to muscle tissue in the body. When muscles are damaged, they release the enzyme CK into the bloodstream. This test can be used to help diagnose a heart attack or diseases of the skeletal muscles, brain, or spinal cord. What is being tested? The creatine kinase test may measure the following: The total amount of CK in your blood (total CK). The amount of three different forms of CK (isoenzymes) in the blood: CK-MM, which is found in your skeletal  muscles and heart. CK-MB, which is found mostly in your heart. CK-BB, which is found mostly in your brain. What kind of sample is taken? At least one blood sample is required for this test. It is usually collected by inserting a needle into a blood vessel. In some cases, you may need to have blood samples taken at  regular intervals for up to 1 week. Tell a health care provider about: All medicines you are taking, including vitamins, herbs, eye drops, creams, and over-the-counter medicines. Any blood disorders you have. Any surgeries you have had. Any medical conditions you have. Whether you are pregnant or may be pregnant. How are the results reported? Your results will be reported as values that indicate: How much total CK is in your blood, given as units per liter (units/L). How much of each measured isoenzyme is in your blood, given as a percentage. Your health care provider will compare your results to normal ranges that were established after testing a large group of people (reference values). Reference values may vary among labs and hospitals. For total CK, common reference values are ranges that vary by age: Adult or elderly (values are higher after exercise): Male: 55-170 units/L. Male: 30-135 units/L. Newborn: 68-580 units/L. Reference values for each isoenzyme are: CK-MM: 100%. CK-MB: 0%. CK-BB: 0%. What do the results mean? Results within reference ranges and values are normal. Levels of total CK that are higher than the reference ranges may mean that you have an injury or a disease affecting your heart, skeletal muscles, or brain. High levels of CK-MM may mean that you have: Certain conditions affecting the skeletal muscle. A variety of conditions can lead to breakdown of skeletal muscle (rhabdomyolysis). A recent history of surgery or injury. Conditions that cause convulsions. These are episodes of uncontrollable movement caused by sudden, intense tightening (contraction) of the muscles. High levels of CK-MB may mean that you have: A recent history of heart attack. Other conditions that cause injury to the heart muscle. High levels of CK-BB may be caused by: Taking certain psychiatric medicines. A disease that affects the brain and spinal cord (central nervous system). Certain  types of cancer. Injury to the lungs. Talk with your health care provider about what your results mean. Questions to ask your health care provider Ask your health care provider, or the department that is doing the test: When will my results be ready? How will I get my results? What are my treatment options? What other tests do I need? What are my next steps? Summary The creatine kinase (CK) test is done to check for damage to muscle tissue in the body. This test can be used to help diagnose a heart attack or diseases of the skeletal muscles, brain, or spinal cord. This test involves measuring total CK and three different forms of CK in the blood. Talk to your health care provider about what your results may mean.  Thyroid-Stimulating Hormone Test Why am I having this test? The thyroid is a gland in the lower front of the neck. It makes hormones that affect many body parts and systems, including the system that affects how quickly the body burns fuel for energy (metabolism). The pituitary gland is located just below the brain, behind the eyes and nasal passages. It helps maintain thyroid hormone levels and thyroid gland function. You may have a thyroid-stimulating hormone (TSH) test if you have possible symptoms of abnormal thyroid hormone levels. This test can help your health care provider: Diagnose a disorder  of the thyroid gland or pituitary gland. Manage your condition and treatment if you have an underactive thyroid (hypothyroidism) or an overactive thyroid (hyperthyroidism). Newborn babies may have this test done to screen for hypothyroidism that is present at birth (congenital). What is being tested? This test measures the amount of TSH in your blood. TSH may also be called thyrotropin. When the thyroid does not make enough hormones, the pituitary gland releases TSH into the bloodstream to stimulate the thyroid gland to make more hormones. What kind of sample is taken?   A blood  sample is required for this test. It is usually collected by inserting a needle into a blood vessel. For newborns, a small amount of blood may be collected from the umbilical cord, or by using a small needle to prick the baby's heel (heel stick). Tell a health care provider about: All medicines you are taking, including vitamins, herbs, eye drops, creams, and over-the-counter medicines. Any blood disorders you have. Any surgeries you have had. Any medical conditions you have. Whether you are pregnant or may be pregnant. How are the results reported? Your test results will be reported as a value that indicates how much TSH is in your blood. Your health care provider will compare your results to normal ranges that were established after testing a large group of people (reference ranges). Reference ranges may vary among labs and hospitals. For this test, common reference ranges are: Adult: 2-10 microunits/mL or 2-10 milliunits/L. Newborn: Heel stick: 3-18 microunits/mL or 3-18 milliunits/L. Umbilical cord: 9-62 microunits/mL or 3-12 milliunits/L. What do the results mean? Results that are within the reference range are considered normal. This means that you have a normal amount of TSH in your blood. Results that are higher than the reference range mean that your TSH levels are too high. This may mean: Your thyroid gland is not making enough thyroid hormones. Your thyroid medicine dosage is too low. You have a tumor on your pituitary gland. This is rare. Results that are lower than the reference range mean that your TSH levels are too low. This may be caused by hyperthyroidism or by a problem with the pituitary gland function. Talk with your health care provider about what your results mean. Questions to ask your health care provider Ask your health care provider, or the department that is doing the test: When will my results be ready? How will I get my results? What are my treatment  options? What other tests do I need? What are my next steps? Summary You may have a thyroid-stimulating hormone (TSH) test if you have possible symptoms of abnormal thyroid hormone levels. The thyroid is a gland in the lower front of the neck. It makes hormones that affect many body parts and systems. The pituitary gland is located just below the brain, behind the eyes and nasal passages. It helps maintain thyroid hormone levels and thyroid gland function. This test measures the amount of TSH in your blood. TSH is made by the pituitary gland. It may also be called thyrotropin. This information is not intended to replace advice given to you by your health care provider. Make sure you discuss any questions you have with your health care provider. Document Revised: 05/24/2020 Document Reviewed: 05/24/2020 Elsevier Patient Education  2022 [employer].

## 2021-07-23 ENCOUNTER — Other Ambulatory Visit: Payer: Self-pay | Admitting: Cardiology

## 2021-07-23 LAB — TSH: TSH: 8.01 mIU/L — ABNORMAL HIGH (ref 0.40–4.50)

## 2021-07-23 LAB — CK: Total CK: 299 U/L — ABNORMAL HIGH (ref 44–196)

## 2021-07-23 LAB — ALDOLASE: Aldolase: 8.5 U/L — ABNORMAL HIGH (ref <=8.1)

## 2021-08-05 ENCOUNTER — Other Ambulatory Visit: Payer: Self-pay | Admitting: Cardiology

## 2021-09-20 ENCOUNTER — Telehealth: Payer: Self-pay | Admitting: Cardiology

## 2021-09-20 NOTE — Telephone Encounter (Signed)
   Primary Cardiologist: Peter Martinique, MD  Chart reviewed as part of pre-operative protocol coverage. Given past medical history and time since last visit, based on ACC/AHA guidelines, DREW LIPS would be at acceptable risk for the planned procedure without further cardiovascular testing. He is able to complete > 4 METS of activity on a consistent basis without chest pain or shortness of breath. He denies presyncope, syncope, palpitations, dizziness, edema, PND, or orthopnea.  His calculated RCRI index is 0.9%, Class II for risk of MACE.  It is our preference that the patient continue low dose aspirin through the perioperative period, however if the surgeon deems it necessary to be held, please contact our office for agreement from the primary cardiologist, Dr. Martinique.  Patient was advised that if he develops new symptoms prior to surgery to contact our office to arrange a follow-up appointment.  He verbalized understanding.  I will route this recommendation to the requesting party via Epic fax function and remove from pre-op pool.  Please call with questions.  Emmaline Life, NP-C    09/20/2021, 2:21 PM [address], [address] Office [phone] Fax [phone]

## 2021-09-20 NOTE — Telephone Encounter (Signed)
° °  Pre-operative Risk Assessment    Patient Name: [name]  DOB: December 10, 1950 MRN: [id number]      Request for Surgical Clearance    Procedure:   LEFT SHOULDER ARTHROSCOPIC ROTATOR CUFF REPAIR AND SUBCHROMEO DECOMPRESSION  Date of Surgery:  Clearance 10/15/20                                 Surgeon:  Dr. Tamera Punt  Surgeon's Group or Practice Name:  [facility]  Phone number:  [phone] Fax number:  [phone]   Type of Clearance Requested:   - Medical  - Pharmacy:  Hold not sure please advise       Type of Anesthesia:   choice    Additional requests/questions:   no  Signed, Kamira J Martinique   09/20/2021, 2:01 PM

## 2022-04-14 ENCOUNTER — Encounter: Payer: Self-pay | Admitting: Neurology

## 2022-04-14 ENCOUNTER — Ambulatory Visit (INDEPENDENT_AMBULATORY_CARE_PROVIDER_SITE_OTHER): Payer: Medicare Other | Admitting: Neurology

## 2022-04-14 VITALS — BP 128/73 | HR 73 | Ht 69.0 in | Wt 231.5 lb

## 2022-04-14 DIAGNOSIS — G4752 REM sleep behavior disorder: Secondary | ICD-10-CM | POA: Diagnosis not present

## 2022-04-14 DIAGNOSIS — S76302D Unspecified injury of muscle, fascia and tendon of the posterior muscle group at thigh level, left thigh, subsequent encounter: Secondary | ICD-10-CM

## 2022-04-14 DIAGNOSIS — R0683 Snoring: Secondary | ICD-10-CM | POA: Diagnosis not present

## 2022-04-14 DIAGNOSIS — R5382 Chronic fatigue, unspecified: Secondary | ICD-10-CM

## 2022-04-14 DIAGNOSIS — Z87891 Personal history of nicotine dependence: Secondary | ICD-10-CM

## 2022-04-14 DIAGNOSIS — G478 Other sleep disorders: Secondary | ICD-10-CM | POA: Insufficient documentation

## 2022-04-14 DIAGNOSIS — G4701 Insomnia due to medical condition: Secondary | ICD-10-CM

## 2022-04-14 DIAGNOSIS — G8929 Other chronic pain: Secondary | ICD-10-CM | POA: Insufficient documentation

## 2022-04-14 DIAGNOSIS — F329 Major depressive disorder, single episode, unspecified: Secondary | ICD-10-CM | POA: Insufficient documentation

## 2022-04-14 MED ORDER — TRAZODONE HCL 50 MG PO TABS
50.0000 mg | ORAL_TABLET | Freq: Every evening | ORAL | 5 refills | Status: AC | PRN
Start: 2022-04-14 — End: ?

## 2022-04-14 NOTE — Addendum Note (Signed)
Addended by: Larey Seat on: 04/14/2022 11:12 AM   Modules accepted: Orders

## 2022-04-14 NOTE — Progress Notes (Signed)
[facility]   Provider:  Larey Seat, M D  Referring Provider: Crist Infante, MD Primary Care Physician:  Crist Infante, MD  Chief Complaint  Patient presents with   New Patient (Initial Visit)    Rm 10, alone. Pt here for re-eval of OSA, snoring and worsening sleep conditions. Last seen in 2018, had SS then and preferred to go the dental device route. Pt reports getting up at least once at night to use the bathroom. Has nights where he cant get back to sleep. Takes him hours to fall back asleep. Pt has tried to loose weight and work out more. Has tried melatonin but has d/c due to longer term SE.     HPI:  [name] is a 71 y.o. male , 04-14-2022. He is seen here as a referral  from Dr. Joylene Draft for a sleep consultation.  He was seen here before , in 2018, and was referred to Sleep dentistry.   He returns today 04-14-2022;  Pt here for re-eval of OSA, snoring and worsening sleep conditions. Last seen in 2018, had SS then and preferred to go the dental device route. Pt reports getting up at least once at night to use the bathroom. Has nights where he cant get back to sleep. Takes him hours to fall back asleep. Pt has tried to loose weight and work out more. Has tried melatonin but has d/c due to weird dreams,  and he has startd to act out dreams, not violently- sleep talking, not leaving the bed. Wakes up.   In February 2018 Mr. [name] presented with a history of coronary artery disease at a young age in his late 21s he had undergone angioplasty and stent placements has been followed by Dr. Peter Martinique.  He also has a diagnosis of hypothyroidism, high cholesterol and high blood pressures.  He does have new diagnoses, as listed in dr Perini's notes from 02-18-2022-  new stent, achilles tendonitis, boot left him with a weaker hamstring on the left. Pain interfered with sleep-.  He has now poorly restorative sleep and yes, he feels depressed, GDS was 6/ 15 points.      Chief  complaint according to patient :" I am snoring " - " I am not sure that I have apnea "   Mr. Maddy was diagnosed with coronary artery disease in his late 37s, he underwent angioplasty and stent placements. He also has hypothyroidism, hypercholesterolemia, hypertension. Over the last 18 month he has developed some cyclic recurrent insomnia, and he may go weeks fine in between.  1 week every 2 month, unaware of trigger factors. He has experienced waking up without an external cause,  Finds himself wide-awake after 2 or 3 hours of sleep. His family has multiple times mention to him that she snores and this has been a factor for many years, leaving at one time to a sleep study, maybe 15 or 20 years ago. At the time apnea was not found.   Sleep habits are as follows: Mr. Vasques goes to bed around 11 PM, his bedroom is cool and quiet and dark, he is sleeping with a special memory foam pillow for neck support, as recommended by his orthopedist. He also will sleep in supine on the special pillow.  When he has insomnia , he wakes at 2 AM, at 3 AM or 4 AM. He has not ha insomnia for the last 14 days.  The patient has been asleep promptly, but not always sleeping  through the night. He does not have nocturia. He is not woken by pain, palpitations, tightness of the chest.  Mrs. Prowse has not noted him to be restless kicking or twitching or at least has not shared this information with her husband. He has tried melatonin and ginger tea, no prescription medication.  He rises at 6.30- & AM , without difficulties. Not dependent on an alarm.  Sleep medical history and family sleep history: The patient has not undergone any ENT surgeries, there is no history of neck trauma or surgery or airway surgery. Social history:  Married,  2 children, now 18 and 38 years old.  Retired-from , "[employer] No history of shift work. Tobacco use- none ( quit 2004), ETOH , on weekends wine 2-3 glasses, caffeine use- no iced teas  or sodas, but coffee( 4-6 cups), throughout the day.  Twin brother with CAD- his brother is a Psychologist, sport and exercise, now retired.    Interval history from 12/29/2016 I have the pleasure of seeing [name] today after he had undergone a home sleep test on 10/27/2016 with a very mild AHI of 7./h and 19 minutes of desaturation time totally sinus rhythm maintained throughout the night and a high likelihood that it was a loud snorer throwing the night. [name] only has very mild obstructive sleep apnea associated with snoring however never clusters of fast and slow heart rates noted between 2 and 3 AM and at 5 AM. Likely representing a REM dependent apnea and REM dependent apnea usually is treated by CPAP. However with the overall to very mild degree of apnea I would leave it to the patient if he wants to be treated. I would feel comfortable to refer him to a dentist as well treating his snoring and reducing the overall apnea probably by about 20-50% which in his case means that he is at a normal physiologically expected apnea count.He is intending to go on a diet and this will help the most !   Review of Systems: Out of a complete 14 system review, the patient complains of only the following symptoms, and all other reviewed systems are negative.  How likely are you to doze in the following situations: 0 = not likely, 1 = slight chance, 2 = moderate chance, 3 = high chance  Sitting and Reading?  1 Watching Television?  3 Sitting inactive in a public place (theater or meeting)?  0 Lying down in the afternoon when circumstances permit?  2 Sitting and talking to someone?  3 Sitting quietly after lunch without alcohol?  1 In a car, while stopped for a few minutes in traffic?  1 As a passenger in a car for an hour without a break?  0  Total = 11/24   Epworth score still 11 , Fatigue severity score  now 52/ from 38  , depression score 6/15   Social History   Socioeconomic History   Marital status: Married     Spouse name: [name]   Number of children: 2   Years of education: Not on file   Highest education level: Master's degree (e.g., MA, MS, MEng, MEd, MSW, MBA)  Occupational History   Occupation: Health visitor: [employer]  Tobacco Use   Smoking status: Former    Packs/day: 1.00    Years: 20.00    Total pack years: 20.00    Types: Cigarettes    Quit date: 09/22/2000    Years since quitting: 21.5   Smokeless  tobacco: Never  Vaping Use   Vaping Use: Never used  Substance and Sexual Activity   Alcohol use: Yes    Alcohol/week: 3.0 standard drinks of alcohol    Types: 3 Glasses of wine per week   Drug use: No   Sexual activity: Not on file  Other Topics Concern   Not on file  Social History Narrative   Lives at home with wife   R handed   Caffeine: 3 C of coffee AM    Social Determinants of Health   Financial Resource Strain: Not on file  Food Insecurity: Not on file  Transportation Needs: Not on file  Physical Activity: Not on file  Stress: Not on file  Social Connections: Not on file  Intimate Partner Violence: Not on file    Family History  Problem Relation Age of Onset   Pancreatic cancer Mother    Heart attack Father        x2   Prostate cancer Father    Stroke Brother    Coronary artery disease Brother    Diabetes Brother    Coronary artery disease Brother    Prostate cancer Paternal Uncle     Past Medical History:  Diagnosis Date   Benign paroxysmal positional vertigo    Coronary artery disease 2004   with prior stenting of the mid LAD and mid right coronary  / cardiac cath with satifsfactory finding following abnormal stress test   Depressive disorder, not elsewhere classified    Gastritis    Gluten enteropathy    Heart palpitations    went to ER on 04/27/2006 for palpitations   Hypercholesteremia    Hypertension    Hypothyroidism    Iron deficiency anemia, unspecified    Myalgia and myositis, unspecified    Personal history of  colonic polyps 10/2006   hyperplastic polyp   SOB (shortness of breath)     Past Surgical History:  Procedure Laterality Date   CARDIAC CATHETERIZATION  08/25/2003   EF estimated 65% / stenting of the mid left  anterior descending and mid right coronary artery in 2004 with drug-eluting stent/Two vessel obstructive atherosclerotic coronary artery disease. / Normal left ventricular function. / Successful stenting of the mid right coronary artery and the mid left anterior descending   CARDIAC CATHETERIZATION  07/17/2010   EF estimated at 65% / Left ventricular angiography was performed in the RAO view  This demonstrates normal left ventricular size and contractility with normal  systolic function / Nonobstructive atherosclerotic coronary disease with continued  excellent patency of the stents in the mid LAD & mid right coronary artery / Normal left ventricular function   COLONOSCOPY     CORONARY STENT INTERVENTION N/A 11/23/2018   Procedure: CORONARY STENT INTERVENTION;  Surgeon: Martinique, Peter M, MD;  Location: [facility];  Service: Cardiovascular;  Laterality: N/A;   LEFT HEART CATH AND CORONARY ANGIOGRAPHY N/A 11/23/2018   Procedure: LEFT HEART CATH AND CORONARY ANGIOGRAPHY;  Surgeon: Martinique, Peter M, MD;  Location: [facility];  Service: Cardiovascular;  Laterality: N/A;   MENISCUS REPAIR Left    OSTEOTOMY     removal of bone spur on (L) arm   POLYPECTOMY     ROTATOR CUFF REPAIR Right     Current Outpatient Medications  Medication Sig Dispense Refill   aspirin 81 MG tablet Take 81 mg by mouth daily.     buPROPion (WELLBUTRIN XL) 300 MG 24 hr tablet Take 300 mg by mouth daily.  chlorthalidone (HYGROTON) 25 MG tablet TAKE 1 TABLET ONCE DAILY. 90 tablet 3   cholecalciferol (VITAMIN D3) 25 MCG (1000 UT) tablet Take 1,000 Units by mouth daily.     Coenzyme Q10 (COQ10) 100 MG CAPS Take 100 mg by mouth daily.     cyanocobalamin (,VITAMIN B-12,) 1000 MCG/ML injection Inject  1,000 mcg into the muscle every 30 (thirty) days.     ezetimibe (ZETIA) 10 MG tablet TAKE 1 TABLET ONCE DAILY. 90 tablet 3   fluticasone (CUTIVATE) 0.05 % cream Apply 1 application topically daily as needed (rash).      irbesartan (AVAPRO) 300 MG tablet Take 1 tablet (300 mg total) by mouth daily.     ketoconazole (NIZORAL) 2 % shampoo Apply 1 application topically 3 (three) times a week.      levothyroxine (SYNTHROID, LEVOTHROID) 200 MCG tablet Take 200 mcg by mouth See admin instructions.      REPATHA SURECLICK 970 MG/ML SOAJ Inject 1 Syringe into the skin every 14 (fourteen) days.     sildenafil (REVATIO) 20 MG tablet Take 80 mg by mouth daily as needed (ED).      testosterone cypionate (DEPOTESTOSTERONE CYPIONATE) 200 MG/ML injection testosterone cypionate 200 mg/mL intramuscular oil     amLODipine (NORVASC) 5 MG tablet Take 1 tablet (5 mg total) by mouth daily. 90 tablet 3   nitroGLYCERIN (NITROSTAT) 0.4 MG SL tablet Place 1 tablet (0.4 mg total) under the tongue every 5 (five) minutes as needed for chest pain. 25 tablet 3   No current facility-administered medications for this visit.    Allergies as of 04/14/2022 - Review Complete 04/14/2022  Allergen Reaction Noted   Atorvastatin  05/14/2021    Vitals: BP 128/73   Pulse 73   Ht '5\' 9"'$  (1.753 m)   Wt 231 lb 8 oz (105 kg)   BMI 34.19 kg/m  Last Weight:  Wt Readings from Last 1 Encounters:  04/14/22 231 lb 8 oz (105 kg)   YOV:ZCHY mass index is 34.19 kg/m.     Last Height:   Ht Readings from Last 1 Encounters:  04/14/22 '5\' 9"'$  (1.753 m)    Physical exam:  General: The patient is awake, alert and appears not in acute distress. The patient is well groomed. Head: Normocephalic, atraumatic. Neck is supple.  Mallampati 3, small oral opening, crowded teeth, biological teeth  neck circumference: 19.5 inches. Nasal airflow severely congested,  Retrognathia is not seen.  Cardiovascular:  Regular rate and rhythm , without  murmurs  or carotid bruit, and without distended neck veins. Respiratory: Lungs are clear to auscultation. Skin:  Without evidence of edema, or rash Trunk: BMI is 34.  The patient's posture is erect   Neurologic exam : The patient is awake and alert, oriented to place and time.    Mood and affect are appropriate. Voice is raspy. He appears fatigued.   Motor function: He has rotator cuff injuries, both sides. Left shoulder is higher. In PT .  His left knee is painful, patella is moveable, left hip pain. Weakness of hip flexion on the left.  He has weakness of hip adduction, strong abduction.  Foot dorsal flexion is strong.   Pain in left hip, under the gluteaus max .  Cranial nerves: Pupils are equal and briskly reactive to light.  Extraocular movements  in vertical and horizontal planes intact and without nystagmus. Visual fields by finger perimetry are intact. Hearing to finger rub intact.  Facial sensation intact to fine touch. Facial  motor strength is symmetric and tongue and uvula move midline. Shoulder shrug was symmetrical.  Deep tendon reflexes: in the  upper and lower extremities are symmetric and intact. Babinski maneuver response is downgoing.  The patient was advised of the nature of the diagnosed sleep disorder , the treatment options and risks for general a health and wellness arising from not treating the condition.  I spent more than 40 minutes of face to face time with the patient. Greater than 50% of time was spent in counseling and coordination of care. We have discussed the diagnosis and differential and I answered the patient's questions.     Assessment:  After physical and neurologic examination, review of laboratory studies,  Personal review of imaging studies, reports of other /same  Imaging studies ,  Results of polysomnography/ neurophysiology testing and pre-existing records as far as provided in visit., my assessment is   1)  Snoring, mild, possibly REM dependent  apnea and allergic rhinitis causing the patient to mouth-breathe.  2) vivid dreams, sometimes sleep taking and enactment of automatisms, this wakes him up- not typical for  REM- BD, when the patient is usually amnestic. No violent dreams.   3) pain, chronic pain - affecting sleep quality, depression level. This is orthopedic, but he has not had nerve conduction studies. EMG and NCV were brought up by the patient.  I will ask for a male Neurologist to perform this test.   4) ankle edema -prn on diuretics.     Plan:  Treatment plan and additional workup :  Attended sleep study with  expanded EEG montage for  dream disorder-   He is in favor of a HST. I will order both if he has again mild Sleep apnea, this time we start PAP therapy.   Trazodone for depression and sleep aid.   Rv in 3 months +/_  Asencion Partridge Blaiden Werth MD  04/14/2022   CC: Crist Infante, [place], [place] [address]

## 2022-04-14 NOTE — Patient Instructions (Signed)
Trazodone Tablets What is this medication? TRAZODONE (TRAZ oh done) treats insomnia , migraines, depression. It increases the amount of serotonin in the brain, a hormone that helps regulate mood. This medicine may be used for other purposes; ask your health care provider or pharmacist if you have questions. COMMON BRAND NAME(S): Desyrel What should I tell my care team before I take this medication? They need to know if you have any of these conditions: Attempted suicide or thinking about it Bipolar disorder Bleeding problems Glaucoma previous heart attack Irregular heart beat Kidney or liver disease Low levels of sodium in the blood An unusual or allergic reaction to trazodone, other medications, foods, dyes or preservatives Pregnant or trying to get pregnant Breast-feeding How should I use this medication? Take this medication by mouth with a glass of water. Follow the directions on the prescription label. Take this medication shortly after a meal or a light snack. Take your medication at regular intervals. Do not take your medication more often than directed. Do not stop taking this medication suddenly except upon the advice of your care team. Stopping this medication too quickly may cause serious side effects or your condition may worsen. A special MedGuide will be given to you by the pharmacist with each prescription and refill. Be sure to read this information carefully each time. Talk to your care team regarding the use of this medication in children. Special care may be needed. Overdosage: If you think you have taken too much of this medicine contact a poison control center or emergency room at once. NOTE: This medicine is only for you. Do not share this medicine with others. What if I miss a dose? If you miss a dose, take it as soon as you can. If it is almost time for your next dose, take only that dose. Do not take double or extra doses. What may interact with this medication? Do  not take this medication with any of the following: Certain medications for fungal infections like fluconazole, itraconazole, ketoconazole, posaconazole, voriconazole Cisapride Dronedarone Linezolid MAOIs like Carbex, Eldepryl, Marplan, Nardil, and Parnate Mesoridazine Methylene blue (injected into a vein) Pimozide Saquinavir Thioridazine This medication may also interact with the following: Alcohol Antiviral medications for HIV or AIDS Aspirin and aspirin-like medications Barbiturates like phenobarbital Certain medications for blood pressure, heart disease, irregular heart beat Certain medications for depression, anxiety, or psychotic disturbances Certain medications for migraine headache like almotriptan, eletriptan, frovatriptan, naratriptan, rizatriptan, sumatriptan, zolmitriptan Certain medications for seizures like carbamazepine and phenytoin Certain medications for sleep Certain medications that treat or prevent blood clots like dalteparin, enoxaparin, warfarin Digoxin Fentanyl Lithium NSAIDS, medications for pain and inflammation, like ibuprofen or naproxen Other medications that prolong the QT interval (cause an abnormal heart rhythm) like dofetilide Rasagiline Supplements like St. John's wort, kava kava, valerian Tramadol Tryptophan This list may not describe all possible interactions. Give your health care provider a list of all the medicines, herbs, non-prescription drugs, or dietary supplements you use. Also tell them if you smoke, drink alcohol, or use illegal drugs. Some items may interact with your medicine. What should I watch for while using this medication? Tell your care team if your symptoms do not get better or if they get worse. Visit your care team for regular checks on your progress. Because it may take several weeks to see the full effects of this medication, it is important to continue your treatment as prescribed by your care team. Watch for new or  worsening thoughts of  suicide or depression. This includes sudden changes in mood, behaviors, or thoughts. These changes can happen at any time but are more common in the beginning of treatment or after a change in dose. Call your care team right away if you experience these thoughts or worsening depression. Manic episodes may happen in patients with bipolar disorder who take this medication. Watch for changes in feelings or behaviors such as feeling anxious, nervous, agitated, panicky, irritable, hostile, aggressive, impulsive, severely restless, overly excited and hyperactive, or trouble sleeping. These changes can happen at any time but are more common in the beginning of treatment or after a change in dose. Call your care team right away if you notice any of these symptoms. You may get drowsy or dizzy. Do not drive, use machinery, or do anything that needs mental alertness until you know how this medication affects you. Do not stand or sit up quickly, especially if you are an older patient. This reduces the risk of dizzy or fainting spells. Alcohol may interfere with the effect of this medication. Avoid alcoholic drinks. This medication may cause dry eyes and blurred vision. If you wear contact lenses you may feel some discomfort. Lubricating drops may help. See your eye doctor if the problem does not go away or is severe. Your mouth may get dry. Chewing sugarless gum, sucking hard candy and drinking plenty of water may help. Contact your care team if the problem does not go away or is severe. What side effects may I notice from receiving this medication? Side effects that you should report to your care team as soon as possible: Allergic reactions--skin rash, itching, hives, swelling of the face, lips, tongue, or throat Bleeding--bloody or black, tar-like stools, red or dark brown urine, vomiting blood or brown material that looks like coffee grounds, small, red or purple spots on skin, unusual bleeding  or bruising Heart rhythm changes--fast or irregular heartbeat, dizziness, feeling faint or lightheaded, chest pain, trouble breathing Low blood pressure--dizziness, feeling faint or lightheaded, blurry vision Low sodium level--muscle weakness, fatigue, dizziness, headache, confusion Prolonged or painful erection Serotonin syndrome--irritability, confusion, fast or irregular heartbeat, muscle stiffness, twitching muscles, sweating, high fever, seizures, chills, vomiting, diarrhea Sudden eye pain or change in vision such as blurry vision, seeing halos around lights, vision loss Thoughts of suicide or self-harm, worsening mood, feelings of depression Side effects that usually do not require medical attention (report to your care team if they continue or are bothersome): Change in sex drive or performance Constipation Dizziness Drowsiness Dry mouth This list may not describe all possible side effects. Call your doctor for medical advice about side effects. You may report side effects to FDA at 1-800-FDA-1088. Where should I keep my medication? Keep out of the reach of children and pets. Store at room temperature between 15 and 30 degrees C (59 to 86 degrees F). Protect from light. Keep container tightly closed. Throw away any unused medication after the expiration date. NOTE: This sheet is a summary. It may not cover all possible information. If you have questions about this medicine, talk to your doctor, pharmacist, or health care provider.  2023 Elsevier/Gold Standard (2020-08-29 00:00:00)

## 2022-04-16 ENCOUNTER — Telehealth: Payer: Self-pay | Admitting: Neurology

## 2022-04-16 NOTE — Telephone Encounter (Signed)
LVM for pt to call back to schedule.

## 2022-04-27 ENCOUNTER — Other Ambulatory Visit: Payer: Self-pay | Admitting: Cardiology

## 2022-04-29 NOTE — Telephone Encounter (Signed)
[facility]- [employer] medicare no auth req.  Patient is scheduled at [facility] for at 9 pm.  Mailed packet to the patient.

## 2022-05-05 ENCOUNTER — Telehealth: Payer: Self-pay | Admitting: Neurology

## 2022-05-05 NOTE — Telephone Encounter (Signed)
Faxed NCV/EMG referral order to [facility] at [phone].

## 2022-05-18 ENCOUNTER — Ambulatory Visit (INDEPENDENT_AMBULATORY_CARE_PROVIDER_SITE_OTHER): Payer: Medicare Other | Admitting: Neurology

## 2022-05-18 DIAGNOSIS — G8929 Other chronic pain: Secondary | ICD-10-CM

## 2022-05-18 DIAGNOSIS — G4752 REM sleep behavior disorder: Secondary | ICD-10-CM

## 2022-05-18 DIAGNOSIS — R5382 Chronic fatigue, unspecified: Secondary | ICD-10-CM

## 2022-05-18 DIAGNOSIS — Z87891 Personal history of nicotine dependence: Secondary | ICD-10-CM

## 2022-05-18 DIAGNOSIS — F329 Major depressive disorder, single episode, unspecified: Secondary | ICD-10-CM

## 2022-05-18 DIAGNOSIS — G4701 Insomnia due to medical condition: Secondary | ICD-10-CM

## 2022-05-18 DIAGNOSIS — G478 Other sleep disorders: Secondary | ICD-10-CM

## 2022-05-18 DIAGNOSIS — R0683 Snoring: Secondary | ICD-10-CM

## 2022-06-02 NOTE — Addendum Note (Signed)
Addended by: Larey Seat on: 06/02/2022 06:28 PM   Modules accepted: Orders

## 2022-06-02 NOTE — Procedures (Signed)
[facility] POLYSOMNOGRAPHY  INTERPRETATION REPORT   STUDY DATE:  05/18/2022     PATIENT NAME:  [name]         DATE OF BIRTH:  11-17-1950  PATIENT ID:  [id number]    TYPE OF STUDY:  RBD  READING PHYSICIAN: Larey Seat, MD REFERRED BY: Crist Infante, MD  SCORING TECHNICIAN: Richard Miu, RPSGT   HISTORY:  [name] is a   71 year-old Male patient was seen on 04-14-2022.  Past medical history  : had mild sleep apnea in 2018 and was referred to sleep dentistry. Never used a dental device, but has begun to snore again and can't sleep through the night. Possible REM BD CAD, angioplasty, insomnia, hypothyroidism, high cholesterol, excessive fatigue and middle of the night arousals.  ADDITIONAL INFORMATION:  The Epworth Sleepiness Scale endorsed at at  11 /24 points (scores above or equal to 10 are suggestive of hypersomnolence). FSS endorsed at 53  /63 points.  Height: 69 in Weight: 213 lb (BMI 31) Neck Size: 20 in  MEDICATIONS: Trazodone, Aspirin, Wellbutrin XL, Hygroton, Vitamin D3, Vitamin B 12, Zetia, Cutivate, Avapro, Nizoral, Synthroid, Repatha, Revatio, Depotestosterone Cypionate, Norvasc, Nitrostat TECHNICAL DESCRIPTION: A registered sleep technologist ( RPSGT)  was in attendance for the duration of the recording.  Data collection, scoring, video monitoring, and reporting were performed in compliance with the AASM Manual for the Scoring of Sleep and Associated Events; (Hypopnea is scored based on the criteria listed in Section VIII D. 1b in the AASM Manual V2.6 using a 4% oxygen desaturation rule or Hypopnea is scored based on the criteria listed in Section VIII D. 1a in the AASM Manual V2.6 using 3% oxygen desaturation and /or arousal rule).   SLEEP CONTINUITY AND SLEEP ARCHITECTURE:  Lights-out was at 22:03: and lights-on at  04:54:, with  6.8 minutes of recording time. Total sleep time ( TST) was 332.5 minutes with a decreased sleep  efficiency at 80.9%.  Sleep latency was normal at 13.0 minutes.  REM sleep latency was increased at 100.0 minutes. Of the total sleep time, the percentage of stage N1 sleep was 4.2%, stage N2 sleep was 59%, stage N3 sleep was 15.2%, and REM sleep was 21.5%. Wake after sleep onset (WASO) time accounted for 65 minutes .  BODY POSITION:  TST was divided  between the following sleep positions: 49.8% supine;  50.2% lateral;  0% prone. Duration of total sleep and percent of total sleep in their respective position is as follows: supine 165 minutes (50%), non-supine 167 minutes (50%); right 90 minutes (27%), left 76 minutes (23%), and prone 00 minutes (0%).  Total supine REM sleep time was 20 minutes (28% of total REM sleep).  There were 2 Stage R periods observed on this study night, 13 awakenings (i.e. transitions to Stage W from any sleep stage), and 60 total stage transitions.   RESPIRATORY MONITORING:  Based on CMS criteria (using a 4% oxygen desaturation rule for scoring hypopneas), there were 13 apneas (13 obstructive; 0 central; 0 mixed), and 78 hypopneas. Apnea index was 2.3/h. Hypopnea index was 14.1/h. The apnea-hypopnea index (AHI) was 16.4/h overall. AHI was 18.9/h  in supine, and 28/h in non-supine. The REM AHI was 37.8 /h, versus NREM AHI of 11.5/h and supine REM AHI of 63.0/h).  There were 0 respiratory effort-related arousals (RERAs).  OXIMETRY: Oxyhemoglobin Saturation Nadir during sleep was at 81% from a mean saturation of 93%.   Of the Total sleep  time (TST),  hypoxemia (=<88%) was present for  9.3 minutes, or 2.8% of total sleep time.  LIMB MOVEMENTS: There were 70 periodic limb movements of sleep (12.6/h), of which 0.0/h were associated with an arousal. Non periodic limb movements were seen in REM sleep- this can be associated with REM BD  AROUSAL: There were 74 arousals in total, for an arousal index of 13 /hour.  Of these, 31 were identified as respiratory-related arousals (6 /h), 0 were  PLM-related arousals (0 /h), and 49 were non-specific arousals (9 /h). EEG:  PSG EEG was of normal amplitude and frequency, with symmetric manifestation of sleep stages. EKG: The electrocardiogram documented NSR.  The average heart rate during sleep was 56 bpm.  The heart rate during sleep varied between a minimum of 49 and a maximum of  72 bpm. AUDIO and VIDEO: Snoring was classified as moderate. no complex movements or vocalizations were captured.    IMPRESSION: 1) Sleep disordered breathing was present. Obstructive Sleep apnea was seen at a  Moderate- Mild degree.   Apnea was dependent on REM sleep.  2) Periodic limb movements were not causing arousals. Non periodic limb movements were seen in REM sleep- this can be associated with REM BD  3) Total sleep time was reduced at 332.5 minutes.  Sleep efficiency was decreased at 80.9%.     RECOMMENDATIONS: 1) Positive airway pressure titration study is recommended and ordered by the interpreting physician. If insurance will not permit an in- laboratory titration, I will order an auto- CPAP device with heated humidification, a pressure window setting of 6-16 cm water, 2 cm EPR, and mask of patient choice. 2) Non periodic limb movements were seen in REM sleep- this can be associated with REM BD. Treatment step 1 is melatonin, 3 mg at nighttime.    Larey Seat, MD

## 2022-06-03 ENCOUNTER — Telehealth: Payer: Self-pay | Admitting: Neurology

## 2022-06-03 NOTE — Telephone Encounter (Signed)
-----   Message from Larey Seat, MD sent at 06/02/2022  6:29 PM EDT ----- RECOMMENDATIONS: 1) Positive airway pressure titration study is recommended and ordered by the interpreting physician.If insurance will not permitan in- laboratory titration, I will order an auto- CPAP device with heated humidification, a pressure window setting of 6-16 cm water, 2 cm EPR, and mask of patient choice. 2) Non periodic limb movements were seen in REM sleep- this can be associated with REM BD. Treatment step 1 is melatonin, 3 mg at nighttime.

## 2022-06-03 NOTE — Telephone Encounter (Signed)
I called pt. I advised pt that Dr. Brett Fairy  reviewed their sleep study results and found that mild to moderate sleep apnea and recommends that pt be treated with a cpap. Dr. Brett Fairy recommends that pt return for a repeat sleep study in order to properly titrate the cpap and ensure a good mask fit. Pt is agreeable to returning for a titration study. I advised pt that our sleep lab will file with pt's insurance and call pt to schedule the sleep study when we hear back from the pt's insurance regarding coverage of this sleep study. Pt verbalized understanding of results. Pt had no questions at this time but was encouraged to call back if questions arise.

## 2022-06-09 NOTE — Telephone Encounter (Signed)
LVM for pt to call back to schedule sleep study.  

## 2022-07-21 ENCOUNTER — Other Ambulatory Visit: Payer: Self-pay | Admitting: Cardiology

## 2022-08-13 ENCOUNTER — Encounter: Payer: Self-pay | Admitting: Neurology

## 2022-08-13 ENCOUNTER — Telehealth: Payer: Self-pay | Admitting: Neurology

## 2022-08-13 NOTE — Telephone Encounter (Signed)
Called the patient. There was no answer. LVM advising that we were looking ahead and schedule for next week and saw where he was scheduled Monday with Janett Billow. On VM, advised the patient that this appt would not be needed since he has not completed the sleep study work up. Asked the pt to call back letting me know he got this message for [place] to take him off the schedule. Will send a mychart message as well.   ** If pt calls back, please advise we will cancel the upcoming apt on Monday 11/27. Offer to give him the sleep lab phone number to schedule the sleep study

## 2022-08-18 ENCOUNTER — Ambulatory Visit: Payer: Medicare Other | Admitting: Adult Health

## 2022-08-18 ENCOUNTER — Encounter: Payer: Self-pay | Admitting: *Deleted

## 2022-08-20 NOTE — Progress Notes (Unsigned)
[facility] [address] [place]. [place] [address] [phone]       OFFICE FOLLOW UP NOTE  Mr. [name] Date of Birth:  [birth year] Medical Record Number:  [id number]   Reason for visit:     SUBJECTIVE:   CHIEF COMPLAINT:  No chief complaint on file.   HPI:   Update 08/21/2022 JM: Patient returns for follow-up visit, previously seen by Dr. Brett Fairy 4 months ago for sleep consultation.  He has not yet completed sleep study.  He has remained on trazodone ***.   Completed EMG/NCV at [facility] which showed left tibial motor demyelinating peripheral neuropathy and left common peroneal demyelinating peripheral neuropathy, Dr. Brett Fairy recommended taking alpha lipoic acid but no further recommendations.        Consult visit 04/14/2022 Dr. Brett Fairy: [name] is a 71 y.o. male , 04-14-2022. He is seen here as a referral  from Dr. Joylene Draft for a sleep consultation.   He was seen here before , in 2018, and was referred to Sleep dentistry.   He returns today 04-14-2022;  Pt here for re-eval of OSA, snoring and worsening sleep conditions. Last seen in 2018, had SS then and preferred to go the dental device route. Pt reports getting up at least once at night to use the bathroom. Has nights where he cant get back to sleep. Takes him hours to fall back asleep. Pt has tried to loose weight and work out more. Has tried melatonin but has d/c due to weird dreams,  and he has startd to act out dreams, not violently- sleep talking, not leaving the bed. Wakes up.    In February 2018 [name] presented with a history of coronary artery disease at a young age in his late 13s he had undergone angioplasty and stent placements has been followed by Dr. Peter Martinique.  He also has a diagnosis of hypothyroidism, high cholesterol and high blood pressures.  He does have new diagnoses, as listed in dr Perini's notes from 02-18-2022-  new stent, achilles tendonitis, boot left him  with a weaker hamstring on the left. Pain interfered with sleep-.   He has now poorly restorative sleep and yes, he feels depressed, GDS was 6/ 15 points.          Chief complaint according to patient :" I am snoring " - " I am not sure that I have apnea "    [name] was diagnosed with coronary artery disease in his late 55s, he underwent angioplasty and stent placements. He also has hypothyroidism, hypercholesterolemia, hypertension. Over the last 18 month he has developed some cyclic recurrent insomnia, and he may go weeks fine in between.  1 week every 2 month, unaware of trigger factors. He has experienced waking up without an external cause,  Finds himself wide-awake after 2 or 3 hours of sleep. His family has multiple times mention to him that she snores and this has been a factor for many years, leaving at one time to a sleep study, maybe 15 or 20 years ago. At the time apnea was not found.    Sleep habits are as follows: [name] goes to bed around 11 PM, his bedroom is cool and quiet and dark, he is sleeping with a special memory foam pillow for neck support, as recommended by his orthopedist. He also will sleep in supine on the special pillow.  When he has insomnia , he wakes at 2 AM, at 3 AM or 4  AM. He has not ha insomnia for the last 14 days.  The patient has been asleep promptly, but not always sleeping through the night. He does not have nocturia. He is not woken by pain, palpitations, tightness of the chest.   Mrs. Fjelstad has not noted him to be restless kicking or twitching or at least has not shared this information with her husband. He has tried melatonin and ginger tea, no prescription medication.  He rises at 6.30- & AM , without difficulties. Not dependent on an alarm.  Sleep medical history and family sleep history: The patient has not undergone any ENT surgeries, there is no history of neck trauma or surgery or airway surgery. Social history:  Married,  2 children, now 21  and 21 years old.  Retired-from , "[employer] No history of shift work. Tobacco use- none ( quit 2004), ETOH , on weekends wine 2-3 glasses, caffeine use- no iced teas or sodas, but coffee( 4-6 cups), throughout the day.   Twin brother with CAD- his brother is a Psychologist, sport and exercise, now retired.        ROS:   14 system review of systems performed and negative with exception of ***  PMH:  Past Medical History:  Diagnosis Date   Benign paroxysmal positional vertigo    Coronary artery disease 2004   with prior stenting of the mid LAD and mid right coronary  / cardiac cath with satifsfactory finding following abnormal stress test   Depressive disorder, not elsewhere classified    Gastritis    Gluten enteropathy    Heart palpitations    went to ER on 04/27/2006 for palpitations   Hypercholesteremia    Hypertension    Hypothyroidism    Iron deficiency anemia, unspecified    Myalgia and myositis, unspecified    Personal history of colonic polyps 10/2006   hyperplastic polyp   SOB (shortness of breath)     PSH:  Past Surgical History:  Procedure Laterality Date   CARDIAC CATHETERIZATION  08/25/2003   EF estimated 65% / stenting of the mid left  anterior descending and mid right coronary artery in 2004 with drug-eluting stent/Two vessel obstructive atherosclerotic coronary artery disease. / Normal left ventricular function. / Successful stenting of the mid right coronary artery and the mid left anterior descending   CARDIAC CATHETERIZATION  07/17/2010   EF estimated at 65% / Left ventricular angiography was performed in the RAO view  This demonstrates normal left ventricular size and contractility with normal  systolic function / Nonobstructive atherosclerotic coronary disease with continued  excellent patency of the stents in the mid LAD & mid right coronary artery / Normal left ventricular function   COLONOSCOPY     CORONARY STENT INTERVENTION N/A 11/23/2018   Procedure: CORONARY  STENT INTERVENTION;  Surgeon: Martinique, Peter M, MD;  Location: [place] [facility];  Service: Cardiovascular;  Laterality: N/A;   LEFT HEART CATH AND CORONARY ANGIOGRAPHY N/A 11/23/2018   Procedure: LEFT HEART CATH AND CORONARY ANGIOGRAPHY;  Surgeon: Martinique, Peter M, MD;  Location: [place] [facility];  Service: Cardiovascular;  Laterality: N/A;   MENISCUS REPAIR Left    OSTEOTOMY     removal of bone spur on (L) arm   POLYPECTOMY     ROTATOR CUFF REPAIR Right     Social History:  Social History   Socioeconomic History   Marital status: Married    Spouse name: [name]   Number of children: 2   Years of education: Not  on file   Highest education level: Master's degree (e.g., MA, MS, MEng, MEd, MSW, MBA)  Occupational History   Occupation: Health visitor: [employer]  Tobacco Use   Smoking status: Former    Packs/day: 1.00    Years: 20.00    Total pack years: 20.00    Types: Cigarettes    Quit date: 09/22/2000    Years since quitting: 21.9   Smokeless tobacco: Never  Vaping Use   Vaping Use: Never used  Substance and Sexual Activity   Alcohol use: Yes    Alcohol/week: 3.0 standard drinks of alcohol    Types: 3 Glasses of wine per week   Drug use: No   Sexual activity: Not on file  Other Topics Concern   Not on file  Social History Narrative   Lives at home with wife   R handed   Caffeine: 3 C of coffee AM    Social Determinants of Health   Financial Resource Strain: Not on file  Food Insecurity: Not on file  Transportation Needs: Not on file  Physical Activity: Not on file  Stress: Not on file  Social Connections: Not on file  Intimate Partner Violence: Not on file    Family History:  Family History  Problem Relation Age of Onset   Pancreatic cancer Mother    Heart attack Father        x2   Prostate cancer Father    Stroke Brother    Coronary artery disease Brother    Diabetes Brother    Coronary artery disease Brother    Prostate cancer Paternal  Uncle     Medications:   Current Outpatient Medications on File Prior to Visit  Medication Sig Dispense Refill   amLODipine (NORVASC) 5 MG tablet TAKE ONE TABLET BY MOUTH EVERY DAY 90 tablet 3   aspirin 81 MG tablet Take 81 mg by mouth daily.     buPROPion (WELLBUTRIN XL) 300 MG 24 hr tablet Take 300 mg by mouth daily.     chlorthalidone (HYGROTON) 25 MG tablet TAKE 1 TABLET ONCE DAILY. 90 tablet 3   cholecalciferol (VITAMIN D3) 25 MCG (1000 UT) tablet Take 1,000 Units by mouth daily.     Coenzyme Q10 (COQ10) 100 MG CAPS Take 100 mg by mouth daily.     cyanocobalamin (,VITAMIN B-12,) 1000 MCG/ML injection Inject 1,000 mcg into the muscle every 30 (thirty) days.     ezetimibe (ZETIA) 10 MG tablet TAKE 1 TABLET ONCE DAILY. 90 tablet 3   fluticasone (CUTIVATE) 0.05 % cream Apply 1 application topically daily as needed (rash).      irbesartan (AVAPRO) 300 MG tablet Take 1 tablet (300 mg total) by mouth daily.     ketoconazole (NIZORAL) 2 % shampoo Apply 1 application topically 3 (three) times a week.      levothyroxine (SYNTHROID, LEVOTHROID) 200 MCG tablet Take 200 mcg by mouth See admin instructions.      nitroGLYCERIN (NITROSTAT) 0.4 MG SL tablet Place 1 tablet (0.4 mg total) under the tongue every 5 (five) minutes as needed for chest pain. 25 tablet 3   REPATHA SURECLICK 720 MG/ML SOAJ Inject 1 Syringe into the skin every 14 (fourteen) days.     sildenafil (REVATIO) 20 MG tablet Take 80 mg by mouth daily as needed (ED).      testosterone cypionate (DEPOTESTOSTERONE CYPIONATE) 200 MG/ML injection testosterone cypionate 200 mg/mL intramuscular oil     traZODone (DESYREL) 50 MG tablet Take  1 tablet (50 mg total) by mouth at bedtime as needed for sleep. 30 tablet 5   No current facility-administered medications on file prior to visit.    Allergies:   Allergies  Allergen Reactions   Atorvastatin     Other reaction(s): ck enzyme up and  muscle aches.      OBJECTIVE:  Physical  Exam  There were no vitals filed for this visit. There is no height or weight on file to calculate BMI. No results found.   General: well developed, well nourished, seated, in no evident distress Head: head normocephalic and atraumatic.   Neck: supple with no carotid or supraclavicular bruits Cardiovascular: regular rate and rhythm, no murmurs Musculoskeletal: no deformity Skin:  no rash/petichiae Vascular:  Normal pulses all extremities   Neurologic Exam Mental Status: Awake and fully alert. Oriented to place and time. Recent and remote memory intact. Attention span, concentration and fund of knowledge appropriate. Mood and affect appropriate.  Cranial Nerves: Pupils equal, briskly reactive to light. Extraocular movements full without nystagmus. Visual fields full to confrontation. Hearing intact. Facial sensation intact. Face, tongue, palate moves normally and symmetrically.  Motor: Normal bulk and tone. Normal strength in all tested extremity muscles Sensory.: intact to touch , pinprick , position and vibratory sensation.  Coordination: Rapid alternating movements normal in all extremities. Finger-to-nose and heel-to-shin performed accurately bilaterally. Gait and Station: Arises from chair without difficulty. Stance is normal. Gait demonstrates normal stride length and balance without use of Joseph. Tandem walk and heel toe without difficulty.  Reflexes: 1+ and symmetric. Toes downgoing.         ASSESSMENT/PLAN: CASHIS RILL is a 71 y.o. year old male        Follow up in *** or call earlier if needed   CC:  PCP: Crist Infante, MD    I spent *** minutes of face-to-face and non-face-to-face time with patient.  This included previsit chart review, lab review, study review, order entry, electronic health record documentation, patient education regarding ***   Frann Rider, AGNP-BC  [facility] [address] [place], [place]  [address]  Phone [phone] Fax [phone] Note: This document was prepared with digital dictation and possible smart phrase technology. Any transcriptional errors that result from this process are unintentional.

## 2022-08-21 ENCOUNTER — Encounter: Payer: Self-pay | Admitting: Adult Health

## 2022-08-21 ENCOUNTER — Ambulatory Visit: Payer: Medicare Other | Admitting: Adult Health

## 2022-08-21 VITALS — BP 122/66 | HR 74 | Ht 69.0 in | Wt 227.0 lb

## 2022-08-21 DIAGNOSIS — G4709 Other insomnia: Secondary | ICD-10-CM | POA: Diagnosis not present

## 2022-08-21 DIAGNOSIS — G4733 Obstructive sleep apnea (adult) (pediatric): Secondary | ICD-10-CM

## 2022-08-21 NOTE — Patient Instructions (Addendum)
Your Plan:   Order placed to start CPAP machine - you will be called to get this started. Please call 30-90 days after starting to schedule a 30-90 day follow up. Can be virtual visit if you wish  Continue use of trazodone as needed for sleep, can try half tablet if full tablet making you too drowsy. You will need to make sure you are you able to get at least 8 hours of sleep when taking.          Thank you for coming to see [place] at [facility]. I hope we have been able to provide you high quality care today.  You may receive a patient satisfaction survey over the next few weeks. We would appreciate your feedback and comments so that we may continue to improve ourselves and the health of our patients.

## 2022-08-24 ENCOUNTER — Other Ambulatory Visit: Payer: Self-pay | Admitting: Cardiology

## 2022-08-27 NOTE — Progress Notes (Unsigned)
Cardiology Office Note:    Date:  08/27/2022   ID:  [name], DOB 04-02-1951, MRN [id number]  PCP:  Crist Infante, [place] Providers Cardiologist:  Peter Martinique, MD { Click to update primary MD,subspecialty MD or APP then REFRESH:1}    Referring MD: Crist Infante, MD   No chief complaint on file. ***  History of Present Illness:    [name] is a 71 y.o. male with a hx of OSA not yet on CPAP, insomnia, CAD, HTN, HLD, and grade 1 DD without heart failure. Heart catheterization in 2004 resulted in DES-mid LAD and DES-mid RCA.  Myoview 2011 was low risk.  Repeat heart catheterization in 2011 showed nonobstructive disease and patent stents.  Myoview in 2016 was low risk.  I saw him in 2019 for evaluation prior to shoulder surgery and he reported exertional dyspnea and new lower extremity swelling.  I repeated an echocardiogram that revealed preserved EF of 69-62%, grade 1 diastolic dysfunction and MAC.  He underwent shoulder surgery without cardiac complications.  Due to ongoing symptoms nuclear stress test in 06/2018 was low risk.  He ultimately underwent repeat heart catheterization on 11/23/2018 revealed patent LAD and RCA stents, mild 15% in-stent restenosis in the mid RCA stent, and 95% stenosis in OM 2 treated with DES 2.5 x 12 mm.  Of note he has a history of ST changes on nuclear stress test but normal perfusion.  During follow-up appointments, he was persistently hypertensive.  Amlodipine and ramipril were increased and he was started on chlorthalidone.  Unfortunately he had issues with his legs and Achilles tendon with swelling and tenderness.  Amlodipine was reduced back to 5 mg daily and ramipril was switched to Avapro 300 mg daily.  He was given Lasix to take and lower extremity Dopplers were negative for DVT.  Echocardiogram was repeated and unremarkable.    He was last seen by Dr. Martinique 04/17/2021.  His ankle swelling subsequently resolved and he now takes  Lasix about every 2 weeks.   He presents for routine follow-up.  Lasix and chlorthalidone both listed on med list   CAD S/p DES-mRCA, DES-mLAD 2004 S/p DES-OM 2 2020 Continue aspirin   Hypertension 5 mg amlodipine, 25 mg chlorthalidone, 300 mg irbesartan   Hyperlipidemia with LDL less than 70 Needs updated lipid panel Who follows this? On Repatha and Zetia   OSA not yet on CPAP     Past Medical History:  Diagnosis Date   Benign paroxysmal positional vertigo    Coronary artery disease 2004   with prior stenting of the mid LAD and mid right coronary  / cardiac cath with satifsfactory finding following abnormal stress test   Depressive disorder, not elsewhere classified    Gastritis    Gluten enteropathy    Heart palpitations    went to ER on 04/27/2006 for palpitations   Hypercholesteremia    Hypertension    Hypothyroidism    Iron deficiency anemia, unspecified    Myalgia and myositis, unspecified    Personal history of colonic polyps 10/2006   hyperplastic polyp   SOB (shortness of breath)     Past Surgical History:  Procedure Laterality Date   CARDIAC CATHETERIZATION  08/25/2003   EF estimated 65% / stenting of the mid left  anterior descending and mid right coronary artery in 2004 with drug-eluting stent/Two vessel obstructive atherosclerotic coronary artery disease. / Normal left ventricular function. / Successful stenting of the mid right coronary  artery and the mid left anterior descending   CARDIAC CATHETERIZATION  07/17/2010   EF estimated at 65% / Left ventricular angiography was performed in the RAO view  This demonstrates normal left ventricular size and contractility with normal  systolic function / Nonobstructive atherosclerotic coronary disease with continued  excellent patency of the stents in the mid LAD & mid right coronary artery / Normal left ventricular function   COLONOSCOPY     CORONARY STENT INTERVENTION N/A 11/23/2018   Procedure: CORONARY  STENT INTERVENTION;  Surgeon: Martinique, Peter M, MD;  Location: [facility];  Service: Cardiovascular;  Laterality: N/A;   LEFT HEART CATH AND CORONARY ANGIOGRAPHY N/A 11/23/2018   Procedure: LEFT HEART CATH AND CORONARY ANGIOGRAPHY;  Surgeon: Martinique, Peter M, MD;  Location: [facility];  Service: Cardiovascular;  Laterality: N/A;   MENISCUS REPAIR Left    OSTEOTOMY     removal of bone spur on (L) arm   POLYPECTOMY     ROTATOR CUFF REPAIR Right     Current Medications: No outpatient medications have been marked as taking for the 09/03/22 encounter (Appointment) with Ledora Bottcher, [place].     Allergies:   Atorvastatin   Social History   Socioeconomic History   Marital status: Married    Spouse name: [name]   Number of children: 2   Years of education: Not on file   Highest education level: Master's degree (e.g., MA, MS, MEng, MEd, MSW, MBA)  Occupational History   Occupation: Health visitor: [employer]  Tobacco Use   Smoking status: Former    Packs/day: 1.00    Years: 20.00    Total pack years: 20.00    Types: Cigarettes    Quit date: 09/22/2000    Years since quitting: 21.9   Smokeless tobacco: Never  Vaping Use   Vaping Use: Never used  Substance and Sexual Activity   Alcohol use: Yes    Alcohol/week: 3.0 standard drinks of alcohol    Types: 3 Glasses of wine per week   Drug use: No   Sexual activity: Not on file  Other Topics Concern   Not on file  Social History Narrative   Lives at home with wife   R handed   Caffeine: 3 C of coffee AM    Social Determinants of Health   Financial Resource Strain: Not on file  Food Insecurity: Not on file  Transportation Needs: Not on file  Physical Activity: Not on file  Stress: Not on file  Social Connections: Not on file     Family History: The patient's ***family history includes Coronary artery disease in his brother and brother; Diabetes in his brother; Heart attack in his father;  Pancreatic cancer in his mother; Prostate cancer in his father and paternal uncle; Stroke in his brother.  ROS:   Please see the history of present illness.    *** All other systems reviewed and are negative.  EKGs/Labs/Other Studies Reviewed:    The following studies were reviewed today: ***  EKG:  EKG is *** ordered today.  The ekg ordered today demonstrates ***  Recent Labs: No results found for requested labs within last 365 days.  Recent Lipid Panel No results found for: "CHOL", "TRIG", "HDL", "CHOLHDL", "VLDL", "LDLCALC", "LDLDIRECT"   Risk Assessment/Calculations:   {Does this patient have ATRIAL FIBRILLATION?:[id number]}  No BP recorded.  {Refresh Note OR Click here to enter BP  :1}***  Physical Exam:    VS:  There were no vitals taken for this visit.    Wt Readings from Last 3 Encounters:  08/21/22 227 lb (103 kg)  04/14/22 231 lb 8 oz (105 kg)  07/22/21 230 lb (104.3 kg)     GEN: *** Well nourished, well developed in no acute distress HEENT: Normal NECK: No JVD; No carotid bruits LYMPHATICS: No lymphadenopathy CARDIAC: ***RRR, no murmurs, rubs, gallops RESPIRATORY:  Clear to auscultation without rales, wheezing or rhonchi  ABDOMEN: Soft, non-tender, non-distended MUSCULOSKELETAL:  No edema; No deformity  SKIN: Warm and dry NEUROLOGIC:  Alert and oriented x 3 PSYCHIATRIC:  Normal affect   ASSESSMENT:    No diagnosis found. PLAN:    In order of problems listed above:  ***      {Are you ordering a CV Procedure (e.g. stress test, cath, DCCV, TEE, etc)?   Press F2        :[id number]}    Medication Adjustments/Labs and Tests Ordered: Current medicines are reviewed at length with the patient today.  Concerns regarding medicines are outlined above.  No orders of the defined types were placed in this encounter.  No orders of the defined types were placed in this encounter.   There are no Patient Instructions on file for this visit.    Signed, Ledora Bottcher, [place]  08/27/2022 9:46 AM    [place]

## 2022-09-03 ENCOUNTER — Encounter: Payer: Self-pay | Admitting: Physician Assistant

## 2022-09-03 ENCOUNTER — Ambulatory Visit: Payer: Medicare Other | Attending: Physician Assistant | Admitting: Physician Assistant

## 2022-09-03 VITALS — BP 114/68 | HR 64 | Ht 69.0 in | Wt 220.0 lb

## 2022-09-03 DIAGNOSIS — I2510 Atherosclerotic heart disease of native coronary artery without angina pectoris: Secondary | ICD-10-CM | POA: Diagnosis not present

## 2022-09-03 DIAGNOSIS — E785 Hyperlipidemia, unspecified: Secondary | ICD-10-CM

## 2022-09-03 DIAGNOSIS — I10 Essential (primary) hypertension: Secondary | ICD-10-CM | POA: Diagnosis not present

## 2022-09-03 DIAGNOSIS — G4733 Obstructive sleep apnea (adult) (pediatric): Secondary | ICD-10-CM | POA: Diagnosis not present

## 2022-09-03 NOTE — Patient Instructions (Signed)
Medication Instructions:  No Changes *If you need a refill on your cardiac medications before your next appointment, please call your pharmacy*   Lab Work: No labs If you have labs (blood work) drawn today and your tests are completely normal, you will receive your results only by: [place] (if you have MyChart) OR A paper copy in the mail If you have any lab test that is abnormal or we need to change your treatment, we will call you to review the results.   Testing/Procedures: No Testing   Follow-Up: At [facility], you and your health needs are our priority.  As part of our continuing mission to provide you with exceptional heart care, we have created designated Provider Care Teams.  These Care Teams include your primary Cardiologist (physician) and Advanced Practice Providers (APPs -  Physician Assistants and Nurse Practitioners) who all work together to provide you with the care you need, when you need it.  We recommend signing up for the patient portal called "MyChart".  Sign up information is provided on this After Visit Summary.  MyChart is used to connect with patients for Virtual Visits (Telemedicine).  Patients are able to view lab/test results, encounter notes, upcoming appointments, etc.  Non-urgent messages can be sent to your provider as well.   To learn more about what you can do with MyChart, go to [email or web address].    Your next appointment:   1 year(s)  The format for your next appointment:   In Person  Provider:   Peter Martinique, MD

## 2022-09-04 ENCOUNTER — Encounter: Payer: Self-pay | Admitting: Neurology

## 2023-02-03 ENCOUNTER — Ambulatory Visit
Admission: EM | Admit: 2023-02-03 | Discharge: 2023-02-03 | Disposition: A | Discharge status: Home / Self Care | Payer: Medicare Other

## 2023-02-03 DIAGNOSIS — L84 Corns and callosities: Secondary | ICD-10-CM

## 2023-02-03 NOTE — ED Provider Notes (Signed)
[facility]    CSN: [id number] Arrival date & time: 02/03/23  1157      History   Chief Complaint Chief Complaint  Patient presents with   Foot Pain    left    HPI [name] is a 72 y.o. male [place] Jansky for evaluation of foot pain.  Patient reports left lateral foot pain for several months.  He initially thought he may have a piece of glass or something in his foot and had his wife look but no foreign body was identified.  No cuts to the area was noted.  He denies stepping on a broken glass.  He states he is developed a "hard bump" to the lateral aspect of his left foot that is painful with pressure.  No swelling, drainage, warmth, fevers.  No known injury.  Has been using Epsom salt soaks without relief.  No other concerns at this time.   Foot Pain    Past Medical History:  Diagnosis Date   Benign paroxysmal positional vertigo    Coronary artery disease 2004   with prior stenting of the mid LAD and mid right coronary  / cardiac cath with satifsfactory finding following abnormal stress test   Depressive disorder, not elsewhere classified    Gastritis    Gluten enteropathy    Heart palpitations    went to ER on 04/27/2006 for palpitations   Hypercholesteremia    Hypertension    Hypothyroidism    Iron deficiency anemia, unspecified    Myalgia and myositis, unspecified    Personal history of colonic polyps 10/2006   hyperplastic polyp   SOB (shortness of breath)     Patient Active Problem List   Diagnosis Date Noted   Non-restorative sleep 04/14/2022   Insomnia secondary to chronic pain 04/14/2022   Former cigarette smoker 04/14/2022   Dream enactment behavior 04/14/2022   Chronic fatigue 04/14/2022   Reactive depression 04/14/2022   Edema of lower extremity 07/22/2021   Groin pain 07/22/2021   Primary localized osteoarthritis of pelvic region and thigh 07/22/2021   Elevated CK 07/22/2021   Pain in left shoulder 07/22/2021   Lumbar pain 07/16/2021    Disorder of the skin and subcutaneous tissue, unspecified 02/06/2020   Pain in unspecified ankle and joints of unspecified foot 09/14/2019   Tendonitis, Achilles, left 03/15/2019   Achilles bursitis 01/10/2019   Angina pectoris (HCC) 11/23/2018   Insomnia 05/14/2018   Dysfunction of left eustachian tube 04/02/2017   Hearing loss 04/02/2017   S/P left knee arthroscopy 01/20/2017   OSA (obstructive sleep apnea) 12/29/2016   Obesity (BMI 30-39.9) 12/29/2016   Acute allergic rhinitis due to pollen 12/29/2016   Chronic pain of left knee 12/17/2016   Celiac disease 12/02/2012   Testicular hypofunction 12/24/2011   Fatigue 11/25/2011   Impaired fasting glucose 06/12/2011   Vitamin B deficiency 06/12/2011   Proteinuria 02/18/2011   Hypertension    Coronary artery disease    Hypercholesteremia    Hypothyroidism    Myalgia 01/27/2011   Major depression, single episode 01/27/2011   Benign paroxysmal positional vertigo 11/20/2009    Past Surgical History:  Procedure Laterality Date   CARDIAC CATHETERIZATION  08/25/2003   EF estimated 65% / stenting of the mid left  anterior descending and mid right coronary artery in 2004 with drug-eluting stent/Two vessel obstructive atherosclerotic coronary artery disease. / Normal left ventricular function. / Successful stenting of the mid right coronary artery and the mid left anterior descending  CARDIAC CATHETERIZATION  07/17/2010   EF estimated at 65% / Left ventricular angiography was performed in the RAO view  This demonstrates normal left ventricular size and contractility with normal  systolic function / Nonobstructive atherosclerotic coronary disease with continued  excellent patency of the stents in the mid LAD & mid right coronary artery / Normal left ventricular function   COLONOSCOPY     CORONARY STENT INTERVENTION N/A 11/23/2018   Procedure: CORONARY STENT INTERVENTION;  Surgeon: Swaziland, Peter M, MD;  Location: [facility];  Service:  Cardiovascular;  Laterality: N/A;   LEFT HEART CATH AND CORONARY ANGIOGRAPHY N/A 11/23/2018   Procedure: LEFT HEART CATH AND CORONARY ANGIOGRAPHY;  Surgeon: Swaziland, Peter M, MD;  Location: [facility];  Service: Cardiovascular;  Laterality: N/A;   MENISCUS REPAIR Left    OSTEOTOMY     removal of bone spur on (L) arm   POLYPECTOMY     ROTATOR CUFF REPAIR Right        Home Medications    Prior to Admission medications   Medication Sig Start Date End Date Taking? Authorizing Provider  amLODipine (NORVASC) 5 MG tablet TAKE ONE TABLET BY MOUTH EVERY DAY 04/29/22   [place], Peter M, MD  ARNUITY ELLIPTA 100 MCG/ACT AEPB 1 puff Inhalation Once a day as directed and rinse mouth with water after use for 30 days 08/27/22   [provider]  aspirin 81 MG tablet Take 81 mg by mouth daily.    [provider]  buPROPion (WELLBUTRIN XL) 300 MG 24 hr tablet Take 300 mg by mouth daily. 10/19/18   [provider]  chlorthalidone (HYGROTON) 25 MG tablet TAKE 1 TABLET ONCE DAILY. 04/29/22   [place], Peter M, MD  cholecalciferol (VITAMIN D3) 25 MCG (1000 UT) tablet Take 1,000 Units by mouth daily.    [provider]  Coenzyme Q10 (COQ10) 100 MG CAPS Take 100 mg by mouth daily.    [provider]  cyanocobalamin (,VITAMIN B-12,) 1000 MCG/ML injection Inject 1,000 mcg into the muscle every 30 (thirty) days.    [provider]  ezetimibe (ZETIA) 10 MG tablet TAKE 1 TABLET ONCE DAILY. 08/25/22   [place], Peter M, MD  fluticasone (CUTIVATE) 0.05 % cream Apply 1 application topically daily as needed (rash).  10/15/18   [provider]  furosemide (LASIX) 20 MG tablet TAKE ONE TABLET BY MOUTH EVERY DAY 08/25/22   [place], Peter M, MD  irbesartan (AVAPRO) 300 MG tablet Take 1 tablet (300 mg total) by mouth daily. 02/06/21   [place], Peter M, MD  ketoconazole (NIZORAL) 2 % shampoo Apply 1 application topically 3 (three) times a week.  03/29/14   [provider]  levothyroxine (SYNTHROID, LEVOTHROID) 200 MCG tablet Take 200 mcg by mouth See admin instructions.     [provider]  nitroGLYCERIN (NITROSTAT) 0.4 MG SL tablet Place 1 tablet (0.4 mg total) under the tongue every 5 (five) minutes as needed for chest pain. 07/13/18 04/22/19  Jodelle Gross, NP  REPATHA SURECLICK 140 MG/ML SOAJ Inject 1 Syringe into the skin every 14 (fourteen) days. 09/30/19   [provider]  sildenafil (REVATIO) 20 MG tablet Take 80 mg by mouth daily as needed (ED).     [provider]  testosterone cypionate (DEPOTESTOSTERONE CYPIONATE) 200 MG/ML injection testosterone cypionate 200 mg/mL intramuscular oil    [provider]  traZODone (DESYREL) 50 MG tablet Take 1 tablet (50 mg total) by mouth at bedtime  as needed for sleep. 04/14/22   Dohmeier, Porfirio Mylar, MD    Family History Family History  Problem Relation Age of Onset   Pancreatic cancer Mother    Heart attack Father        x2   Prostate cancer Father    Stroke Brother    Coronary artery disease Brother    Diabetes Brother    Coronary artery disease Brother    Prostate cancer Paternal Uncle     Social History Social History   Tobacco Use   Smoking status: Former    Packs/day: 1.00    Years: 20.00    Additional pack years: 0.00    Total pack years: 20.00    Types: Cigarettes    Quit date: 09/22/2000    Years since quitting: 22.3   Smokeless tobacco: Never  Vaping Use   Vaping Use: Never used  Substance Use Topics   Alcohol use: Yes    Alcohol/week: 3.0 standard drinks of alcohol    Types: 3 Glasses of wine per week   Drug use: No     Allergies   Atorvastatin   Review of Systems Review of Systems  Musculoskeletal:        Left foot pain      Physical Exam Triage Vital Signs ED Triage Vitals  Enc Vitals Group     BP 02/03/23 1206 134/86     Pulse Rate 02/03/23 1206 68     Resp 02/03/23 1202 16     Temp 02/03/23 1206 98.1 F (36.7 C)      Temp Source 02/03/23 1202 Oral     SpO2 02/03/23 1206 95 %     Weight --      Height --      Head Circumference --      Peak Flow --      Pain Score 02/03/23 1208 6     Pain Loc --      Pain Edu? --      Excl. in GC? --    No data found.  Updated Vital Signs BP 134/86 (BP Location: Left Arm)   Pulse 68   Temp 98.1 F (36.7 C) (Oral)   Resp 16   SpO2 95%   Visual Acuity Right Eye Distance:   Left Eye Distance:   Bilateral Distance:    Right Eye Near:   Left Eye Near:    Bilateral Near:     Physical Exam Vitals and nursing note reviewed.  Constitutional:      General: He is not in acute distress.    Appearance: Normal appearance. He is not ill-appearing.  HENT:     Head: Normocephalic and atraumatic.  Eyes:     Pupils: Pupils are equal, round, and reactive to light.  Cardiovascular:     Rate and Rhythm: Normal rate.  Pulmonary:     Effort: Pulmonary effort is normal.  Musculoskeletal:       Feet:  Feet:     Comments: Corn noted to left lateral distal foot.  No swelling, erythema, warmth.  Tender to palpation. Skin:    General: Skin is warm and dry.  Neurological:     General: No focal deficit present.     Mental Status: He is alert and oriented to person, place, and time.  Psychiatric:        Mood and Affect: Mood normal.        Behavior: Behavior normal.      UC Treatments / Results  Labs (all labs ordered are listed, but only abnormal results are displayed) Labs Reviewed - No data to display  EKG   Radiology No results found.  Procedures Procedures (including critical care time)  Medications Ordered in UC Medications - No data to display  Initial Impression / Assessment and Plan / UC Course  I have reviewed the triage vital signs and the nursing notes.  Pertinent labs & imaging results that were available during my care of the patient were reviewed by me and considered in my medical decision making (see chart for details).      Discussed with symptoms unlikely foreign body but symptoms and exam consistent with a corn/callus Will refer to podiatry for further treatment Follow-up with PCP as needed Final Clinical Impressions(s) / UC Diagnoses   Final diagnoses:  Corn of foot     Discharge Instructions      Please follow-up with [facility] for further treatment options of your corn   ED Prescriptions   None    PDMP not reviewed this encounter.   Radford Pax, NP 02/03/23 [phone]

## 2023-02-03 NOTE — ED Triage Notes (Addendum)
Pt presents to UC w/ c/o left foot pain for "several months". Pt has tried epsom salt soaks without relief. Pain is located on outer side of left foot.

## 2023-02-03 NOTE — Discharge Instructions (Addendum)
Please follow-up with [facility] for further treatment options of your corn

## 2023-02-17 ENCOUNTER — Telehealth: Payer: Self-pay | Admitting: Neurology

## 2023-03-05 ENCOUNTER — Encounter: Payer: Self-pay | Admitting: *Deleted

## 2023-03-05 NOTE — Progress Notes (Signed)
PATIENT: [name] DOB: June 03, 1951  REASON FOR VISIT: follow up HISTORY FROM: patient PRIMARY NEUROLOGIST: Dr. Vickey Huger  Virtual Visit via Video Note  I connected with [name] on 03/09/23 at  8:45 AM EDT by a video enabled telemedicine application located remotely at [facility] and verified that I am speaking with the correct person using two identifiers who was located at their own home.   I discussed the limitations of evaluation and management by telemedicine and the availability of in person appointments. The patient expressed understanding and agreed to proceed.   PATIENT: [name] DOB: 1950-11-16  REASON FOR VISIT: follow up HISTORY FROM: patient  HISTORY OF PRESENT ILLNESS: Today 03/09/23:  [name] is a 71 y.o. male with a history of obstructive sleep apnea on CPAP. Returns today for follow-up.  He reports that he did spend 17 days in [place] for his daughter's wedding and did not use the CPAP during that time.  He states that he does have to get used to putting it on nightly.  His download is below       REVIEW OF SYSTEMS: Out of a complete 14 system review of symptoms, the patient complains only of the following symptoms, and all other reviewed systems are negative.  ALLERGIES: Allergies  Allergen Reactions   Atorvastatin     Other reaction(s): ck enzyme up and  muscle aches.    HOME MEDICATIONS: Outpatient Medications Prior to Visit  Medication Sig Dispense Refill   amLODipine (NORVASC) 5 MG tablet TAKE ONE TABLET BY MOUTH EVERY DAY 90 tablet 3   ARNUITY ELLIPTA 100 MCG/ACT AEPB 1 puff Inhalation Once a day as directed and rinse mouth with water after use for 30 days     aspirin 81 MG tablet Take 81 mg by mouth daily.     buPROPion (WELLBUTRIN XL) 300 MG 24 hr tablet Take 300 mg by mouth daily.     chlorthalidone (HYGROTON) 25 MG tablet TAKE 1 TABLET ONCE DAILY. 90 tablet 3   cholecalciferol (VITAMIN D3) 25  MCG (1000 UT) tablet Take 1,000 Units by mouth daily.     Coenzyme Q10 (COQ10) 100 MG CAPS Take 100 mg by mouth daily.     cyanocobalamin (,VITAMIN B-12,) 1000 MCG/ML injection Inject 1,000 mcg into the muscle every 30 (thirty) days.     ezetimibe (ZETIA) 10 MG tablet TAKE 1 TABLET ONCE DAILY. 90 tablet 3   fluticasone (CUTIVATE) 0.05 % cream Apply 1 application topically daily as needed (rash).      furosemide (LASIX) 20 MG tablet TAKE ONE TABLET BY MOUTH EVERY DAY 90 tablet 3   irbesartan (AVAPRO) 300 MG tablet Take 1 tablet (300 mg total) by mouth daily.     ketoconazole (NIZORAL) 2 % shampoo Apply 1 application topically 3 (three) times a week.      levothyroxine (SYNTHROID, LEVOTHROID) 200 MCG tablet Take 200 mcg by mouth See admin instructions.      nitroGLYCERIN (NITROSTAT) 0.4 MG SL tablet Place 1 tablet (0.4 mg total) under the tongue every 5 (five) minutes as needed for chest pain. 25 tablet 3   REPATHA SURECLICK 140 MG/ML SOAJ Inject 1 Syringe into the skin every 14 (fourteen) days.     sildenafil (REVATIO) 20 MG tablet Take 80 mg by mouth daily as needed (ED).      testosterone cypionate (DEPOTESTOSTERONE CYPIONATE) 200 MG/ML injection testosterone cypionate 200 mg/mL intramuscular oil     traZODone (  DESYREL) 50 MG tablet Take 1 tablet (50 mg total) by mouth at bedtime as needed for sleep. 30 tablet 5   No facility-administered medications prior to visit.    PAST MEDICAL HISTORY: Past Medical History:  Diagnosis Date   Benign paroxysmal positional vertigo    Coronary artery disease 2004   with prior stenting of the mid LAD and mid right coronary  / cardiac cath with satifsfactory finding following abnormal stress test   Depressive disorder, not elsewhere classified    Gastritis    Gluten enteropathy    Heart palpitations    went to ER on 04/27/2006 for palpitations   Hypercholesteremia    Hypertension    Hypothyroidism    Iron deficiency anemia, unspecified    Myalgia and  myositis, unspecified    Personal history of colonic polyps 10/2006   hyperplastic polyp   SOB (shortness of breath)     PAST SURGICAL HISTORY: Past Surgical History:  Procedure Laterality Date   CARDIAC CATHETERIZATION  08/25/2003   EF estimated 65% / stenting of the mid left  anterior descending and mid right coronary artery in 2004 with drug-eluting stent/Two vessel obstructive atherosclerotic coronary artery disease. / Normal left ventricular function. / Successful stenting of the mid right coronary artery and the mid left anterior descending   CARDIAC CATHETERIZATION  07/17/2010   EF estimated at 65% / Left ventricular angiography was performed in the RAO view  This demonstrates normal left ventricular size and contractility with normal  systolic function / Nonobstructive atherosclerotic coronary disease with continued  excellent patency of the stents in the mid LAD & mid right coronary artery / Normal left ventricular function   COLONOSCOPY     CORONARY STENT INTERVENTION N/A 11/23/2018   Procedure: CORONARY STENT INTERVENTION;  Surgeon: Swaziland, Peter M, MD;  Location: [facility] INVASIVE CV LAB;  Service: Cardiovascular;  Laterality: N/A;   LEFT HEART CATH AND CORONARY ANGIOGRAPHY N/A 11/23/2018   Procedure: LEFT HEART CATH AND CORONARY ANGIOGRAPHY;  Surgeon: Swaziland, Peter M, MD;  Location: [facility] INVASIVE CV LAB;  Service: Cardiovascular;  Laterality: N/A;   MENISCUS REPAIR Left    OSTEOTOMY     removal of bone spur on (L) arm   POLYPECTOMY     ROTATOR CUFF REPAIR Right     FAMILY HISTORY: Family History  Problem Relation Age of Onset   Pancreatic cancer Mother    Heart attack Father        x2   Prostate cancer Father    Stroke Brother    Coronary artery disease Brother    Diabetes Brother    Coronary artery disease Brother    Prostate cancer Paternal Uncle     SOCIAL HISTORY: Social History   Socioeconomic History   Marital status: Married    Spouse name: Zella Ball   Number of  children: 2   Years of education: Not on file   Highest education level: Master's degree (e.g., MA, MS, MEng, MEd, MSW, MBA)  Occupational History   Occupation: Warehouse manager: [employer]  Tobacco Use   Smoking status: Former    Packs/day: 1.00    Years: 20.00    Additional pack years: 0.00    Total pack years: 20.00    Types: Cigarettes    Quit date: 09/22/2000    Years since quitting: 22.4   Smokeless tobacco: Never  Vaping Use   Vaping Use: Never used  Substance and Sexual Activity   Alcohol use: Yes  Alcohol/week: 3.0 standard drinks of alcohol    Types: 3 Glasses of wine per week   Drug use: No   Sexual activity: Not on file  Other Topics Concern   Not on file  Social History Narrative   Lives at home with wife   R handed   Caffeine: 3 C of coffee AM    Social Determinants of Health   Financial Resource Strain: Not on file  Food Insecurity: Not on file  Transportation Needs: Not on file  Physical Activity: Not on file  Stress: Not on file  Social Connections: Not on file  Intimate Partner Violence: Not on file      PHYSICAL EXAM Generalized: Well developed, in no acute distress   Neurological examination  Mentation: Alert oriented to time, place, history taking. Follows all commands speech and language fluent Cranial nerve II-XII: Facial symmetry noted  DIAGNOSTIC DATA (LABS, IMAGING, TESTING) - I reviewed patient records, labs, notes, testing and imaging myself where available.  Lab Results  Component Value Date   WBC 5.2 11/17/2018   HGB 13.2 11/17/2018   HCT 40.2 11/17/2018   MCV 81 11/17/2018   PLT 285 11/17/2018      Component Value Date/Time   NA 141 06/20/2020 1257   K 4.4 06/20/2020 1257   CL 98 06/20/2020 1257   CO2 28 06/20/2020 1257   GLUCOSE 72 06/20/2020 1257   BUN 17 06/20/2020 1257   CREATININE 0.92 06/20/2020 1257   CALCIUM 9.4 06/20/2020 1257   GFRNONAA 86 06/20/2020 1257   GFRAA 99 06/20/2020 1257   No  results found for: "CHOL", "HDL", "LDLCALC", "LDLDIRECT", "TRIG", "CHOLHDL" No results found for: "HGBA1C" Lab Results  Component Value Date   VITAMINB12 267 05/05/2011   Lab Results  Component Value Date   TSH 8.01 (H) 07/22/2021      ASSESSMENT AND PLAN 72 y.o. year old male  has a past medical history of Benign paroxysmal positional vertigo, Coronary artery disease (2004), Depressive disorder, not elsewhere classified, Gastritis, Gluten enteropathy, Heart palpitations, Hypercholesteremia, Hypertension, Hypothyroidism, Iron deficiency anemia, unspecified, Myalgia and myositis, unspecified, Personal history of colonic polyps (10/2006), and SOB (shortness of breath). here with:  OSA on CPAP  CPAP compliance excellent Residual AHI is good Encouraged patient to continue using CPAP nightly and > 4 hours each night F/U in 1 year or sooner if needed    Butch Penny, MSN, NP-C 03/09/2023, 7:53 AM [facility] [address] [place], [place] [address] [phone]

## 2023-03-09 ENCOUNTER — Encounter: Payer: Self-pay | Admitting: Cardiology

## 2023-03-09 ENCOUNTER — Telehealth (INDEPENDENT_AMBULATORY_CARE_PROVIDER_SITE_OTHER): Payer: Medicare Other | Admitting: Adult Health

## 2023-03-09 DIAGNOSIS — G4733 Obstructive sleep apnea (adult) (pediatric): Secondary | ICD-10-CM

## 2023-06-02 ENCOUNTER — Encounter: Payer: Self-pay | Admitting: Gastroenterology

## 2023-06-02 ENCOUNTER — Ambulatory Visit: Payer: Medicare Other | Admitting: Gastroenterology

## 2023-06-02 ENCOUNTER — Other Ambulatory Visit: Payer: Medicare Other

## 2023-06-02 VITALS — BP 124/80 | HR 59 | Ht 69.0 in | Wt 219.6 lb

## 2023-06-02 DIAGNOSIS — E611 Iron deficiency: Secondary | ICD-10-CM

## 2023-06-02 MED ORDER — NA SULFATE-K SULFATE-MG SULF 17.5-3.13-1.6 GM/177ML PO SOLN: 1.0000 | Freq: Once | ORAL | 0 refills | Status: AC

## 2023-06-02 NOTE — Patient Instructions (Addendum)
Your provider has requested that you go to the basement level for lab work before leaving today. Press "B" on the elevator. The lab is located at the first door on the left as you exit the elevator.   You have been scheduled for an endoscopy and colonoscopy. Please follow the written instructions given to you at your visit today.  Please pick up your prep supplies at the pharmacy within the next 1-3 days.  If you use inhalers (even only as needed), please bring them with you on the day of your procedure.  DO NOT TAKE 7 DAYS PRIOR TO TEST- Trulicity (dulaglutide) Ozempic, Wegovy (semaglutide) Mounjaro (tirzepatide) Bydureon Bcise (exanatide extended release)  DO NOT TAKE 1 DAY PRIOR TO YOUR TEST Rybelsus (semaglutide) Adlyxin (lixisenatide) Victoza (liraglutide) Byetta (exanatide) ___________________________________________________________________________  _______________________________________________________  If your blood pressure at your visit was 140/90 or greater, please contact your primary care physician to follow up on this.  _______________________________________________________  If you are age 30 or older, your body mass index should be between 23-30. Your Body mass index is 32.43 kg/m. If this is out of the aforementioned range listed, please consider follow up with your Primary Care Provider.  If you are age 19 or younger, your body mass index should be between 19-25. Your Body mass index is 32.43 kg/m. If this is out of the aformentioned range listed, please consider follow up with your Primary Care Provider.   ________________________________________________________  The [place] [facility] providers would like to encourage you to use [facility] to communicate with providers for non-urgent requests or questions.  Due to long hold times on the telephone, sending your provider a message by [facility] may be a faster and more efficient way to get a response.  Please allow 48  business hours for a response.  Please remember that this is for non-urgent requests.  _______________________________________________________

## 2023-06-02 NOTE — Progress Notes (Signed)
06/02/2023 [name] [id number] Jan 18, 1951   HISTORY OF PRESENT ILLNESS:  This is a 72 year old male who was previously a patient of Dr. Norval Gable.  He is here today at the request of his PCP for evaluation of mild iron deficiency.  Hemoglobin is normal at 14.9.  Ferritin low at 20.8, serum iron normal at 48, iron saturation low at 13, TIBC normal at 365.  Vitamin B12 level back in November 2023 was normal 547 and was Hemoccult negative back in November 2023.  Looks like he was on vitamin B12 injections previously, did not verify if he was still taking those.  He has had iron deficiency at least dating back to 2012.  He received IV iron infusions he says a couple years ago.  Last EGD and colonoscopy in 2012 for the same reason.  He reports his PCP told him that he might have celiac disease.  He limits gluten but still does eat it.  Biopsies from endoscopy in 2012 did not show any villous atrophy or blunting.  He was bowels regularly with no black or bloody stools.  Colonoscopy July 2012 showed only internal and external hemorrhoids.  Endoscopy July 2012 showed mild gastritis with normal esophagus and duodenum.  1. Surgical [P], small bowel, bx - CHRONIC DUODENITIS CONSISTENT WITH PEPTIC DUODENITIS. NO VILLOUS ATROPHY. HELICOBACTER PYLORI OR MALIGNANCY IDENTIFIED 2. Surgical [P], gastric, bx - MODERATE CHRONIC ATROPHIC GASTRITIS WITH INTESTINAL METAPLASIA. - NO EVIDENCE OF HELICOBACTER PYLORI, DYSPLASIA OR MALIGNANCY IDENTIFIED   Past Medical History:  Diagnosis Date   Benign paroxysmal positional vertigo    Coronary artery disease 2004   with prior stenting of the mid LAD and mid right coronary  / cardiac cath with satifsfactory finding following abnormal stress test   Depressive disorder, not elsewhere classified    Gastritis    Gluten enteropathy    Heart palpitations    went to ER on 04/27/2006 for palpitations   Hypercholesteremia    Hypertension    Hypothyroidism     Iron deficiency anemia, unspecified    Myalgia and myositis, unspecified    Personal history of colonic polyps 10/2006   hyperplastic polyp   SOB (shortness of breath)    Past Surgical History:  Procedure Laterality Date   CARDIAC CATHETERIZATION  08/25/2003   EF estimated 65% / stenting of the mid left  anterior descending and mid right coronary artery in 2004 with drug-eluting stent/Two vessel obstructive atherosclerotic coronary artery disease. / Normal left ventricular function. / Successful stenting of the mid right coronary artery and the mid left anterior descending   CARDIAC CATHETERIZATION  07/17/2010   EF estimated at 65% / Left ventricular angiography was performed in the RAO view  This demonstrates normal left ventricular size and contractility with normal  systolic function / Nonobstructive atherosclerotic coronary disease with continued  excellent patency of the stents in the mid LAD & mid right coronary artery / Normal left ventricular function   COLONOSCOPY     CORONARY STENT INTERVENTION N/A 11/23/2018   Procedure: CORONARY STENT INTERVENTION;  Surgeon: Swaziland, Peter M, MD;  Location: [facility] INVASIVE CV LAB;  Service: Cardiovascular;  Laterality: N/A;   LEFT HEART CATH AND CORONARY ANGIOGRAPHY N/A 11/23/2018   Procedure: LEFT HEART CATH AND CORONARY ANGIOGRAPHY;  Surgeon: Swaziland, Peter M, MD;  Location: [facility] INVASIVE CV LAB;  Service: Cardiovascular;  Laterality: N/A;   MENISCUS REPAIR Left    OSTEOTOMY     removal of bone spur on (L)  arm   POLYPECTOMY     ROTATOR CUFF REPAIR Right     reports that he quit smoking about 22 years ago. His smoking use included cigarettes. He started smoking about 42 years ago. He has a 20 pack-year smoking history. He has never used smokeless tobacco. He reports current alcohol use of about 3.0 standard drinks of alcohol per week. He reports that he does not use drugs. family history includes Coronary artery disease in his brother and brother;  Diabetes in his brother; Heart attack in his father; Pancreatic cancer in his mother; Prostate cancer in his father and paternal uncle; Stroke in his brother. Allergies  Allergen Reactions   Atorvastatin     Other reaction(s): ck enzyme up and  muscle aches.      Outpatient Encounter Medications as of 06/02/2023  Medication Sig   amLODipine (NORVASC) 5 MG tablet TAKE ONE TABLET BY MOUTH EVERY DAY   ARNUITY ELLIPTA 100 MCG/ACT AEPB 1 puff Inhalation Once a day as directed and rinse mouth with water after use for 30 days   aspirin 81 MG tablet Take 81 mg by mouth daily.   buPROPion (WELLBUTRIN XL) 300 MG 24 hr tablet Take 300 mg by mouth daily.   chlorthalidone (HYGROTON) 25 MG tablet TAKE 1 TABLET ONCE DAILY.   cholecalciferol (VITAMIN D3) 25 MCG (1000 UT) tablet Take 1,000 Units by mouth daily.   Coenzyme Q10 (COQ10) 100 MG CAPS Take 100 mg by mouth daily.   cyanocobalamin (,VITAMIN B-12,) 1000 MCG/ML injection Inject 1,000 mcg into the muscle every 30 (thirty) days.   ezetimibe (ZETIA) 10 MG tablet TAKE 1 TABLET ONCE DAILY.   fluticasone (CUTIVATE) 0.05 % cream Apply 1 application topically daily as needed (rash).    irbesartan (AVAPRO) 300 MG tablet Take 1 tablet (300 mg total) by mouth daily.   ketoconazole (NIZORAL) 2 % shampoo Apply 1 application topically 3 (three) times a week.    levothyroxine (SYNTHROID, LEVOTHROID) 200 MCG tablet Take 200 mcg by mouth See admin instructions.    REPATHA SURECLICK 140 MG/ML SOAJ Inject 1 Syringe into the skin every 14 (fourteen) days.   sildenafil (REVATIO) 20 MG tablet Take 80 mg by mouth daily as needed (ED).    testosterone cypionate (DEPOTESTOSTERONE CYPIONATE) 200 MG/ML injection testosterone cypionate 200 mg/mL intramuscular oil   traZODone (DESYREL) 50 MG tablet Take 1 tablet (50 mg total) by mouth at bedtime as needed for sleep.   nitroGLYCERIN (NITROSTAT) 0.4 MG SL tablet Place 1 tablet (0.4 mg total) under the tongue every 5 (five) minutes  as needed for chest pain.   [DISCONTINUED] furosemide (LASIX) 20 MG tablet TAKE ONE TABLET BY MOUTH EVERY DAY   No facility-administered encounter medications on file as of 06/02/2023.    REVIEW OF SYSTEMS  : All other systems reviewed and negative except where noted in the History of Present Illness.   PHYSICAL EXAM: BP 124/80   Pulse (!) 59   Ht 5\' 9"  (1.753 m)   Wt 219 lb 9.6 oz (99.6 kg)   BMI 32.43 kg/m  General: Well developed white male in no acute distress Head: Normocephalic and atraumatic Eyes:  Sclerae anicteric, conjunctiva pink. Ears: Normal auditory acuity Lungs: Clear throughout to auscultation; no W/R/R. Heart: Regular rate and rhythm; no M/R/G. Rectal:  Will be done at the time of colonoscopy. Musculoskeletal: Symmetrical with no gross deformities  Skin: No lesions on visible extremities Extremities: No edema  Neurological: Alert oriented x 4, grossly non-focal Psychological:  Alert and cooperative. Normal mood and affect  ASSESSMENT AND PLAN: *72 year old male with mild iron deficiency.  Hemoglobin is normal.  He has had iron deficiency at least dating back to 2012.  He received IV iron infusions he says a couple years ago.  Last EGD and colonoscopy in 2012 for the same reason.  He reports his PCP told him that he might have celiac disease.  He limits gluten but still does eat it.  Biopsies from endoscopy in 2012 did not show any villous atrophy or blunting.  Will check celiac labs today and will proceed with EGD and colonoscopy.  He is heme-negative.  Will schedule with Dr. Barron Alvine.  The risks, benefits, and alternatives to EGD and colonoscopy were discussed with the patient and he consents to proceed.    CC:  Rodrigo Ran, MD

## 2023-06-03 LAB — TISSUE TRANSGLUTAMINASE ABS,IGG,IGA
(tTG) Ab, IgA: 3 U/mL
(tTG) Ab, IgG: <1 U/mL

## 2023-06-03 LAB — IGA: Immunoglobulin A: 336 mg/dL — ABNORMAL HIGH (ref 70–320)

## 2023-06-04 ENCOUNTER — Encounter: Payer: Self-pay | Admitting: Cardiology

## 2023-06-10 NOTE — Progress Notes (Signed)
Agree with the assessment and plan as outlined by Jessica Zehr, PA-C. ? ?Omesha Bowerman, DO, FACG ? ?

## 2023-06-16 ENCOUNTER — Other Ambulatory Visit: Payer: Self-pay | Admitting: Cardiology

## 2023-06-18 ENCOUNTER — Encounter: Payer: Self-pay | Admitting: Gastroenterology

## 2023-06-28 ENCOUNTER — Encounter: Payer: Self-pay | Admitting: Certified Registered Nurse Anesthetist

## 2023-07-01 ENCOUNTER — Encounter: Payer: Self-pay | Admitting: Gastroenterology

## 2023-07-01 ENCOUNTER — Ambulatory Visit: Payer: Medicare Other | Admitting: Gastroenterology

## 2023-07-01 VITALS — BP 138/68 | HR 61 | Temp 98.0°F | Resp 13 | Ht 69.0 in | Wt 210.0 lb

## 2023-07-01 DIAGNOSIS — K2990 Gastroduodenitis, unspecified, without bleeding: Secondary | ICD-10-CM

## 2023-07-01 DIAGNOSIS — Z860100 Personal history of colon polyps, unspecified: Secondary | ICD-10-CM

## 2023-07-01 DIAGNOSIS — E611 Iron deficiency: Secondary | ICD-10-CM

## 2023-07-01 DIAGNOSIS — K2970 Gastritis, unspecified, without bleeding: Secondary | ICD-10-CM

## 2023-07-01 DIAGNOSIS — K635 Polyp of colon: Secondary | ICD-10-CM | POA: Diagnosis not present

## 2023-07-01 DIAGNOSIS — K3189 Other diseases of stomach and duodenum: Secondary | ICD-10-CM | POA: Diagnosis not present

## 2023-07-01 DIAGNOSIS — D123 Benign neoplasm of transverse colon: Secondary | ICD-10-CM

## 2023-07-01 DIAGNOSIS — K641 Second degree hemorrhoids: Secondary | ICD-10-CM

## 2023-07-01 MED ORDER — SODIUM CHLORIDE 0.9 % IV SOLN: 500.0000 mL | Freq: Once | INTRAVENOUS | Status: DC

## 2023-07-01 NOTE — Progress Notes (Unsigned)
Called to room to assist during endoscopic procedure.  Patient ID and intended procedure confirmed with present staff. Received instructions for my participation in the procedure from the performing physician.  

## 2023-07-01 NOTE — Progress Notes (Unsigned)
GASTROENTEROLOGY PROCEDURE H&P NOTE   Primary Care Physician: Rodrigo Ran, MD    Reason for Procedure:   Iron deficiency  Plan:    EGD. Colonoscopy  Patient is appropriate for endoscopic procedure(s) in the ambulatory ([facility]) setting.  The nature of the procedure, as well as the risks, benefits, and alternatives were carefully and thoroughly reviewed with the patient. Ample time for discussion and questions allowed. The patient understood, was satisfied, and agreed to proceed.     HPI: [name] is a 72 y.o. male who presents for EGD and colonoscopy for evaluation of iron deficiency. No overt bleeding and recent FOBT- stools.   Past Medical History:  Diagnosis Date   Asthma    Benign paroxysmal positional vertigo    Cancer (HCC)    skin cancer   Coronary artery disease 2004   with prior stenting of the mid LAD and mid right coronary  / cardiac cath with satifsfactory finding following abnormal stress test   Depressive disorder, not elsewhere classified    Gastritis    Gluten enteropathy    Heart palpitations    went to ER on 04/27/2006 for palpitations   Hypercholesteremia    Hypertension    Hypothyroidism    Iron deficiency anemia, unspecified    Myalgia and myositis, unspecified    Personal history of colonic polyps 10/2006   hyperplastic polyp   Sleep apnea    uses CPAP   SOB (shortness of breath)     Past Surgical History:  Procedure Laterality Date   CARDIAC CATHETERIZATION  08/25/2003   EF estimated 65% / stenting of the mid left  anterior descending and mid right coronary artery in 2004 with drug-eluting stent/Two vessel obstructive atherosclerotic coronary artery disease. / Normal left ventricular function. / Successful stenting of the mid right coronary artery and the mid left anterior descending   CARDIAC CATHETERIZATION  07/17/2010   EF estimated at 65% / Left ventricular angiography was performed in the RAO view  This demonstrates normal left  ventricular size and contractility with normal  systolic function / Nonobstructive atherosclerotic coronary disease with continued  excellent patency of the stents in the mid LAD & mid right coronary artery / Normal left ventricular function   COLONOSCOPY     CORONARY STENT INTERVENTION N/A 11/23/2018   Procedure: CORONARY STENT INTERVENTION;  Surgeon: Swaziland, Peter M, MD;  Location: [facility] INVASIVE CV LAB;  Service: Cardiovascular;  Laterality: N/A;   LEFT HEART CATH AND CORONARY ANGIOGRAPHY N/A 11/23/2018   Procedure: LEFT HEART CATH AND CORONARY ANGIOGRAPHY;  Surgeon: Swaziland, Peter M, MD;  Location: [facility] INVASIVE CV LAB;  Service: Cardiovascular;  Laterality: N/A;   MENISCUS REPAIR Left    OSTEOTOMY     removal of bone spur on (L) arm   POLYPECTOMY     ROTATOR CUFF REPAIR Right    SHOULDER ARTHROSCOPY W/ ROTATOR CUFF REPAIR Left     Prior to Admission medications   Medication Sig Start Date End Date Taking? Authorizing Provider  amLODipine (NORVASC) 5 MG tablet TAKE ONE TABLET BY MOUTH EVERY DAY 04/29/22  Yes [place], Peter M, MD  ARNUITY ELLIPTA 100 MCG/ACT AEPB 1 puff Inhalation Once a day as directed and rinse mouth with water after use for 30 days 08/27/22  Yes [provider]  aspirin 81 MG tablet Take 81 mg by mouth daily.   Yes [provider]  buPROPion (WELLBUTRIN XL) 300 MG 24 hr tablet Take 300 mg by mouth daily. 10/19/18  Yes [provider]  chlorthalidone (HYGROTON) 25 MG tablet TAKE 1 TABLET ONCE DAILY. 06/17/23  Yes [place], Peter M, MD  cholecalciferol (VITAMIN D3) 25 MCG (1000 UT) tablet Take 1,000 Units by mouth daily.   Yes [provider]  Coenzyme Q10 (COQ10) 100 MG CAPS Take 100 mg by mouth daily.   Yes [provider]  cyanocobalamin (,VITAMIN B-12,) 1000 MCG/ML injection Inject 1,000 mcg into the muscle every 30 (thirty) days.   Yes [provider]  ezetimibe (ZETIA) 10 MG tablet TAKE 1 TABLET ONCE DAILY. 08/25/22  Yes  [place], Peter M, MD  fluticasone (CUTIVATE) 0.05 % cream Apply 1 application topically daily as needed (rash).  10/15/18  Yes [provider]  irbesartan (AVAPRO) 300 MG tablet Take 1 tablet (300 mg total) by mouth daily. 02/06/21  Yes [place], Peter M, MD  ketoconazole (NIZORAL) 2 % shampoo Apply 1 application topically 3 (three) times a week.  03/29/14  Yes [provider]  levothyroxine (SYNTHROID, LEVOTHROID) 200 MCG tablet Take 200 mcg by mouth See admin instructions.    Yes [provider]  potassium chloride (KLOR-CON) 10 MEQ tablet 1 tablet with food Orally 3 times a week on mon, wed, friday. 05/14/23  Yes [provider]  testosterone cypionate (DEPOTESTOSTERONE CYPIONATE) 200 MG/ML injection testosterone cypionate 200 mg/mL intramuscular oil   Yes [provider]  nitroGLYCERIN (NITROSTAT) 0.4 MG SL tablet Place 1 tablet (0.4 mg total) under the tongue every 5 (five) minutes as needed for chest pain. 07/13/18 04/22/19  Jodelle Gross, NP  REPATHA SURECLICK 140 MG/ML SOAJ Inject 1 Syringe into the skin every 14 (fourteen) days. 09/30/19   [provider]  sildenafil (REVATIO) 20 MG tablet Take 80 mg by mouth daily as needed (ED).     [provider]  traZODone (DESYREL) 50 MG tablet Take 1 tablet (50 mg total) by mouth at bedtime as needed for sleep. 04/14/22   Dohmeier, Porfirio Mylar, MD    Current Outpatient Medications  Medication Sig Dispense Refill   amLODipine (NORVASC) 5 MG tablet TAKE ONE TABLET BY MOUTH EVERY DAY 90 tablet 3   ARNUITY ELLIPTA 100 MCG/ACT AEPB 1 puff Inhalation Once a day as directed and rinse mouth with water after use for 30 days     aspirin 81 MG tablet Take 81 mg by mouth daily.     buPROPion (WELLBUTRIN XL) 300 MG 24 hr tablet Take 300 mg by mouth daily.     chlorthalidone (HYGROTON) 25 MG tablet TAKE 1 TABLET ONCE DAILY. 90 tablet 0   cholecalciferol (VITAMIN D3) 25 MCG (1000 UT) tablet Take 1,000 Units by  mouth daily.     Coenzyme Q10 (COQ10) 100 MG CAPS Take 100 mg by mouth daily.     cyanocobalamin (,VITAMIN B-12,) 1000 MCG/ML injection Inject 1,000 mcg into the muscle every 30 (thirty) days.     ezetimibe (ZETIA) 10 MG tablet TAKE 1 TABLET ONCE DAILY. 90 tablet 3   fluticasone (CUTIVATE) 0.05 % cream Apply 1 application topically daily as needed (rash).      irbesartan (AVAPRO) 300 MG tablet Take 1 tablet (300 mg total) by mouth daily.     ketoconazole (NIZORAL) 2 % shampoo Apply 1 application topically 3 (three) times a week.      levothyroxine (SYNTHROID, LEVOTHROID) 200 MCG tablet Take 200 mcg by mouth See admin instructions.      potassium chloride (KLOR-CON) 10 MEQ tablet 1 tablet with food Orally 3 times  a week on mon, wed, friday.     testosterone cypionate (DEPOTESTOSTERONE CYPIONATE) 200 MG/ML injection testosterone cypionate 200 mg/mL intramuscular oil     nitroGLYCERIN (NITROSTAT) 0.4 MG SL tablet Place 1 tablet (0.4 mg total) under the tongue every 5 (five) minutes as needed for chest pain. 25 tablet 3   REPATHA SURECLICK 140 MG/ML SOAJ Inject 1 Syringe into the skin every 14 (fourteen) days.     sildenafil (REVATIO) 20 MG tablet Take 80 mg by mouth daily as needed (ED).      traZODone (DESYREL) 50 MG tablet Take 1 tablet (50 mg total) by mouth at bedtime as needed for sleep. 30 tablet 5   Current Facility-Administered Medications  Medication Dose Route Frequency Provider Last Rate Last Admin   0.9 %  sodium chloride infusion  500 mL Intravenous Once Jovonte Commins V, DO        Allergies as of 07/01/2023 - Review Complete 07/01/2023  Allergen Reaction Noted   Atorvastatin  05/14/2021    Family History  Problem Relation Age of Onset   Pancreatic cancer Mother    Heart attack Father        x2   Prostate cancer Father    Stroke Brother    Coronary artery disease Brother    Diabetes Brother    Coronary artery disease Brother    Prostate cancer Paternal Uncle    Colon  cancer Neg Hx    Esophageal cancer Neg Hx    Stomach cancer Neg Hx     Social History   Socioeconomic History   Marital status: Married    Spouse name: Zella Ball   Number of children: 2   Years of education: Not on file   Highest education level: Master's degree (e.g., MA, MS, MEng, MEd, MSW, MBA)  Occupational History   Occupation: Warehouse manager: [employer]  Tobacco Use   Smoking status: Former    Current packs/day: 0.00    Average packs/day: 1 pack/day for 20.0 years (20.0 ttl pk-yrs)    Types: Cigarettes    Start date: 09/22/1980    Quit date: 09/22/2000    Years since quitting: 22.7   Smokeless tobacco: Never  Vaping Use   Vaping status: Never Used  Substance and Sexual Activity   Alcohol use: Yes    Alcohol/week: 3.0 standard drinks of alcohol    Types: 3 Glasses of wine per week   Drug use: No   Sexual activity: Not on file  Other Topics Concern   Not on file  Social History Narrative   Lives at home with wife   R handed   Caffeine: 3 C of coffee AM    Social Determinants of Health   Financial Resource Strain: Not on file  Food Insecurity: Not on file  Transportation Needs: Not on file  Physical Activity: Not on file  Stress: Not on file  Social Connections: Unknown (02/04/2022)   Received from [facility]   Social Network    Social Network: Not on file  Intimate Partner Violence: Unknown (12/27/2021)   Received from [facility]   HITS    Physically Hurt: Not on file    Insult or Talk Down To: Not on file    Threaten Physical Harm: Not on file    Scream or Curse: Not on file    Physical Exam: Vital signs in last 24 hours: @BP  135/81   Pulse 73   Temp 98 F (  36.7 C)   Ht 5\' 9"  (1.753 m)   Wt 210 lb (95.3 kg)   SpO2 97%   BMI 31.01 kg/m  GEN: NAD EYE: Sclerae anicteric ENT: MMM CV: Non-tachycardic Pulm: CTA b/l GI: Soft, NT/ND NEURO:  Alert & Oriented x 3   Doristine Locks, DO [place] [facility]   07/01/2023 2:33 PM

## 2023-07-01 NOTE — Progress Notes (Unsigned)
1513 Robinul 0.1 mg IV given due large amount of secretions upon assessment.  MD made aware, vss 

## 2023-07-01 NOTE — Op Note (Signed)
[facility] Patient Name: [name] Procedure Date: 07/01/2023 3:17 PM MRN: [id number] Endoscopist: Doristine Locks , MD, [id number] Age: 72 Referring MD:  Date of Birth: 1950-11-09 Gender: Male Account #: [id number] Procedure:                Upper GI endoscopy Indications:              Iron deficiency Medicines:                Monitored Anesthesia Care Procedure:                Pre-Anesthesia Assessment:                           - Prior to the procedure, a History and Physical                            was performed, and patient medications and                            allergies were reviewed. The patient's tolerance of                            previous anesthesia was also reviewed. The risks                            and benefits of the procedure and the sedation                            options and risks were discussed with the patient.                            All questions were answered, and informed consent                            was obtained. Prior Anticoagulants: The patient has                            taken no anticoagulant or antiplatelet agents. ASA                            Grade Assessment: II - A patient with mild systemic                            disease. After reviewing the risks and benefits,                            the patient was deemed in satisfactory condition to                            undergo the procedure.                           After obtaining informed consent, the endoscope was  passed under direct vision. Throughout the                            procedure, the patient's blood pressure, pulse, and                            oxygen saturations were monitored continuously. The                            Olympus Scope [id number] was introduced through the                            mouth, and advanced to the second part of duodenum.                            The upper GI endoscopy was  accomplished without                            difficulty. The patient tolerated the procedure                            well. Scope In: Scope Out: Findings:                 The examined esophagus was normal.                           Diffuse mild inflammation characterized by                            congestion (edema) was found in the gastric fundus                            and in the gastric body. Biopsies were taken with a                            cold forceps for histology. Estimated blood loss                            was minimal.                           The incisura and gastric antrum were normal.                            Additional mucosal biopsies were taken with a cold                            forceps for histology. Estimated blood loss was                            minimal.                           The examined duodenum was normal. Biopsies were  taken with a cold forceps for histology. Estimated                            blood loss was minimal. Complications:            No immediate complications. Estimated Blood Loss:     Estimated blood loss was minimal. Impression:               - Normal esophagus.                           - Gastritis. Biopsied.                           - Normal incisura and antrum. Biopsied.                           - Normal examined duodenum. Biopsied. Recommendation:           - Patient has a contact number available for                            emergencies. The signs and symptoms of potential                            delayed complications were discussed with the                            patient. Return to normal activities tomorrow.                            Written discharge instructions were provided to the                            patient.                           - Resume previous diet.                           - Continue present medications.                           - Await pathology  results.                           - Perform a colonoscopy today. Doristine Locks, MD 07/01/2023 4:10:18 PM

## 2023-07-01 NOTE — Progress Notes (Unsigned)
1530 Nasopharyngeal airway size 7.0 placed without trauma, vss

## 2023-07-01 NOTE — Op Note (Signed)
[facility] Patient Name: [name] Procedure Date: 07/01/2023 3:16 PM MRN: [id number] Endoscopist: Doristine Locks , MD, [id number] Age: 72 Referring MD:  Date of Birth: 07/21/1951 Gender: Male Account #: [id number] Procedure:                Colonoscopy Indications:              Iron deficiency anemia Medicines:                Monitored Anesthesia Care Procedure:                After obtaining informed consent, the colonoscope                            was passed under direct vision. Throughout the                            procedure, the patient's blood pressure, pulse, and                            oxygen saturations were monitored continuously. The                            Olympus Scope SN: [id number] was introduced through                            the anus and advanced to the the terminal ileum.                            The colonoscopy was performed without difficulty.                            The patient tolerated the procedure well. The                            quality of the bowel preparation was good. The                            terminal ileum, ileocecal valve, appendiceal                            orifice, and rectum were photographed. Scope In: 3:29:45 PM Scope Out: 3:52:46 PM Scope Withdrawal Time: 0 hours 10 minutes 11 seconds  Total Procedure Duration: 0 hours 23 minutes 1 second  Findings:                 Hemorrhoids were found on perianal exam.                           The transverse colon and ascending colon revealed                            significantly excessive looping. Advancing the                            scope required using manual pressure and  straightening and shortening the scope to obtain                            bowel loop reduction.                           A 3 mm polyp was found in the splenic flexure. The                            polyp was sessile. The polyp was removed with a                             cold snare. Resection and retrieval were complete.                            Estimated blood loss was minimal.                           Normal mucosa was found in the entire colon.                           Non-bleeding external and internal hemorrhoids were                            found during retroflexion. The hemorrhoids were                            small.                           The terminal ileum appeared normal. Complications:            No immediate complications. Estimated Blood Loss:     Estimated blood loss was minimal. Impression:               - Hemorrhoids found on perianal exam.                           - There was significant looping of the colon.                           - One 3 mm polyp at the splenic flexure, removed                            with a cold snare. Resected and retrieved.                           - Normal mucosa in the entire examined colon.                           - Non-bleeding external and internal hemorrhoids.                           - The examined portion of the ileum was normal. Recommendation:           -  Patient has a contact number available for                            emergencies. The signs and symptoms of potential                            delayed complications were discussed with the                            patient. Return to normal activities tomorrow.                            Written discharge instructions were provided to the                            patient.                           - Resume previous diet.                           - Continue present medications.                           - Await pathology results.                           - Repeat colonoscopy for surveillance based on                            pathology results. If polyp is not adenomatous, can                            likley forego repeat colonoscopy from a screening                            standpoint.                            - Depending on pathology results from upper                            endoscopy and repeat labs, may consider further                            small bowel interrogation with Video Capsule                            Endoscopy.                           - Return to GI office after studies are complete. Doristine Locks, MD 07/01/2023 4:14:44 PM

## 2023-07-01 NOTE — Progress Notes (Unsigned)
Report given to PACU, vss 

## 2023-07-01 NOTE — Patient Instructions (Signed)
Please read handouts provided. Continue present medications. Await pathology results. Return to GI office after studies are complete.   YOU HAD AN ENDOSCOPIC PROCEDURE TODAY AT THE [facility]:   Refer to the procedure report that was given to you for any specific questions about what was found during the examination.  If the procedure report does not answer your questions, please call your gastroenterologist to clarify.  If you requested that your care partner not be given the details of your procedure findings, then the procedure report has been included in a sealed envelope for you to review at your convenience later.  YOU SHOULD EXPECT: Some feelings of bloating in the abdomen. Passage of more gas than usual.  Walking can help get rid of the air that was put into your GI tract during the procedure and reduce the bloating. If you had a lower endoscopy (such as a colonoscopy or flexible sigmoidoscopy) you may notice spotting of blood in your stool or on the toilet paper. If you underwent a bowel prep for your procedure, you may not have a normal bowel movement for a few days.  Please Note:  You might notice some irritation and congestion in your nose or some drainage.  This is from the oxygen used during your procedure.  There is no need for concern and it should clear up in a day or so.  SYMPTOMS TO REPORT IMMEDIATELY:  Following lower endoscopy (colonoscopy or flexible sigmoidoscopy):  Excessive amounts of blood in the stool  Significant tenderness or worsening of abdominal pains  Swelling of the abdomen that is new, acute  Fever of 100F or higher  Following upper endoscopy (EGD)  Vomiting of blood or coffee ground material  New chest pain or pain under the shoulder blades  Painful or persistently difficult swallowing  New shortness of breath  Fever of 100F or higher  Black, tarry-looking stools  For urgent or emergent issues, a gastroenterologist can be reached at any  hour by calling [phone]. Do not use MyChart messaging for urgent concerns.    DIET:  We do recommend a small meal at first, but then you may proceed to your regular diet.  Drink plenty of fluids but you should avoid alcoholic beverages for 24 hours.  ACTIVITY:  You should plan to take it easy for the rest of today and you should NOT DRIVE or use heavy machinery until tomorrow (because of the sedation medicines used during the test).    FOLLOW UP: Our staff will call the number listed on your records the next business day following your procedure.  We will call around 7:15- 8:00 am to check on you and address any questions or concerns that you may have regarding the information given to you following your procedure. If we do not reach you, we will leave a message.     If any biopsies were taken you will be contacted by phone or by letter within the next 1-3 weeks.  Please call us at [phone] if you have not heard about the biopsies in 3 weeks.    SIGNATURES/CONFIDENTIALITY: You and/or your care partner have signed paperwork which will be entered into your electronic medical record.  These signatures attest to the fact that that the information above on your After Visit Summary has been reviewed and is understood.  Full responsibility of the confidentiality of this discharge information lies with you and/or your care-partner.

## 2023-07-02 ENCOUNTER — Telehealth: Payer: Self-pay

## 2023-07-02 NOTE — Telephone Encounter (Signed)
Follow up call placed, no answer and no VM. 

## 2023-07-02 NOTE — Telephone Encounter (Signed)
Patient called stating he is doing well.

## 2023-07-07 LAB — SURGICAL PATHOLOGY

## 2023-07-30 ENCOUNTER — Other Ambulatory Visit: Payer: Self-pay | Admitting: Cardiology

## 2023-08-16 ENCOUNTER — Other Ambulatory Visit: Payer: Self-pay | Admitting: Cardiology

## 2023-08-17 ENCOUNTER — Encounter: Payer: Self-pay | Admitting: Cardiology

## 2023-08-18 ENCOUNTER — Other Ambulatory Visit: Payer: Self-pay | Admitting: Internal Medicine

## 2023-08-18 DIAGNOSIS — M48061 Spinal stenosis, lumbar region without neurogenic claudication: Secondary | ICD-10-CM

## 2023-08-18 NOTE — Telephone Encounter (Signed)
Spoke with pt, he reports for the last week he has noticed SOB when walking or working in the yard. He reports it comes and goes. He does report swelling in his feet but reducing his salt intake has helped with the swelling. The fast heart beat occurs when he is active and once he rest it will settle down. He did have an episode of a "thump" in his chest that went by quickly and has not returned. He is not having chest pain or other symptoms. Follow up scheduled. Patient voiced understanding to call if symptoms change or worsen prior to appointment.

## 2023-08-25 ENCOUNTER — Encounter: Payer: Self-pay | Admitting: Nurse Practitioner

## 2023-08-25 ENCOUNTER — Ambulatory Visit: Payer: Medicare Other | Attending: Nurse Practitioner | Admitting: Nurse Practitioner

## 2023-08-25 VITALS — BP 110/62 | HR 68 | Ht 69.0 in | Wt 224.0 lb

## 2023-08-25 DIAGNOSIS — E785 Hyperlipidemia, unspecified: Secondary | ICD-10-CM

## 2023-08-25 DIAGNOSIS — G4733 Obstructive sleep apnea (adult) (pediatric): Secondary | ICD-10-CM

## 2023-08-25 DIAGNOSIS — R600 Localized edema: Secondary | ICD-10-CM | POA: Diagnosis not present

## 2023-08-25 DIAGNOSIS — I2510 Atherosclerotic heart disease of native coronary artery without angina pectoris: Secondary | ICD-10-CM

## 2023-08-25 DIAGNOSIS — R0609 Other forms of dyspnea: Secondary | ICD-10-CM

## 2023-08-25 DIAGNOSIS — I10 Essential (primary) hypertension: Secondary | ICD-10-CM | POA: Diagnosis not present

## 2023-08-25 MED ORDER — FUROSEMIDE 20 MG PO TABS: ORAL_TABLET | ORAL | 3 refills | Status: AC

## 2023-08-25 NOTE — Patient Instructions (Signed)
Medication Instructions:  Lasix (Furosemide) 20 mg daily as needed for swelling or weight gain.  *If you need a refill on your cardiac medications before your next appointment, please call your pharmacy*   Lab Work: NONE ordered at this time of appointment    Testing/Procedures: Your physician has requested that you have an echocardiogram. Echocardiography is a painless test that uses sound waves to create images of your heart. It provides your doctor with information about the size and shape of your heart and how well your heart's chambers and valves are working. This procedure takes approximately one hour. There are no restrictions for this procedure. Please do NOT wear cologne, perfume, aftershave, or lotions (deodorant is allowed). Please arrive 15 minutes prior to your appointment time.  Please note: We ask at that you not bring children with you during ultrasound (echo/ vascular) testing. Due to room size and safety concerns, children are not allowed in the ultrasound rooms during exams. Our front office staff cannot provide observation of children in our lobby area while testing is being conducted. An adult accompanying a patient to their appointment will only be allowed in the ultrasound room at the discretion of the ultrasound technician under special circumstances. We apologize for any inconvenience.    Follow-Up: At [facility], you and your health needs are our priority.  As part of our continuing mission to provide you with exceptional heart care, we have created designated Provider Care Teams.  These Care Teams include your primary Cardiologist (physician) and Advanced Practice Providers (APPs -  Physician Assistants and Nurse Practitioners) who all work together to provide you with the care you need, when you need it.  We recommend signing up for the patient portal called "MyChart".  Sign up information is provided on this After Visit Summary.  MyChart is used to  connect with patients for Virtual Visits (Telemedicine).  Patients are able to view lab/test results, encounter notes, upcoming appointments, etc.  Non-urgent messages can be sent to your provider as well.   To learn more about what you can do with MyChart, go to [email or web address].    Your next appointment:   6-8 week(s)  Provider:   Micah Flesher, PA-C or Bernadene Person, NP        Other Instructions

## 2023-08-25 NOTE — Progress Notes (Signed)
Office Visit    Patient Name: [name] Date of Encounter: 08/25/2023  Primary Care Provider:  Rodrigo Ran, MD Primary Cardiologist:  Peter Swaziland, MD  Chief Complaint   72 year old male with a history of CAD with prior stenting to the mid LAD and RCA, in 2004,  DES-OM2 in 2020, hypertension, hyperlipidemia, OSA on CPAP, iron deficiency anemia and hypothyroidism who presents for follow-up related to CAD.  Past Medical History    Past Medical History:  Diagnosis Date   Asthma    Benign paroxysmal positional vertigo    Cancer (HCC)    skin cancer   Coronary artery disease 2004   with prior stenting of the mid LAD and mid right coronary  / cardiac cath with satifsfactory finding following abnormal stress test   Depressive disorder, not elsewhere classified    Gastritis    Gluten enteropathy    Heart palpitations    went to ER on 04/27/2006 for palpitations   Hypercholesteremia    Hypertension    Hypothyroidism    Iron deficiency anemia, unspecified    Myalgia and myositis, unspecified    Personal history of colonic polyps 10/2006   hyperplastic polyp   Sleep apnea    uses CPAP   SOB (shortness of breath)    Past Surgical History:  Procedure Laterality Date   CARDIAC CATHETERIZATION  08/25/2003   EF estimated 65% / stenting of the mid left  anterior descending and mid right coronary artery in 2004 with drug-eluting stent/Two vessel obstructive atherosclerotic coronary artery disease. / Normal left ventricular function. / Successful stenting of the mid right coronary artery and the mid left anterior descending   CARDIAC CATHETERIZATION  07/17/2010   EF estimated at 65% / Left ventricular angiography was performed in the RAO view  This demonstrates normal left ventricular size and contractility with normal  systolic function / Nonobstructive atherosclerotic coronary disease with continued  excellent patency of the stents in the mid LAD & mid right coronary artery / Normal  left ventricular function   COLONOSCOPY     CORONARY STENT INTERVENTION N/A 11/23/2018   Procedure: CORONARY STENT INTERVENTION;  Surgeon: Swaziland, Peter M, MD;  Location: [facility] INVASIVE CV LAB;  Service: Cardiovascular;  Laterality: N/A;   LEFT HEART CATH AND CORONARY ANGIOGRAPHY N/A 11/23/2018   Procedure: LEFT HEART CATH AND CORONARY ANGIOGRAPHY;  Surgeon: Swaziland, Peter M, MD;  Location: [facility] INVASIVE CV LAB;  Service: Cardiovascular;  Laterality: N/A;   MENISCUS REPAIR Left    OSTEOTOMY     removal of bone spur on (L) arm   POLYPECTOMY     ROTATOR CUFF REPAIR Right    SHOULDER ARTHROSCOPY W/ ROTATOR CUFF REPAIR Left     Allergies  Allergies  Allergen Reactions   Atorvastatin     Other reaction(s): ck enzyme up and  muscle aches.     Labs/Other Studies Reviewed    The following studies were reviewed today:  Cardiac Studies & Procedures   CARDIAC CATHETERIZATION  CARDIAC CATHETERIZATION 11/23/2018  Narrative  Prox RCA to Mid RCA lesion is 15% stenosed.  Previously placed Mid LAD stent (unknown type) is widely patent.  Ost 2nd Mrg lesion is 95% stenosed.  Post intervention, there is a 0% residual stenosis.  A drug-eluting stent was successfully placed using a STENT RESOLUTE ONYX 2.5X12.  The left ventricular systolic function is normal.  LV end diastolic pressure is normal.  The left ventricular ejection fraction is 55-65% by visual estimate.  1.  Single vessel obstructive CAD with 95% OM 2 lesion. Prior stents in the LAD and RCA are patent. 2. Normal LV function 3. Normal LVEDP 4. Successful PCI of the OM2 with DES  Plan: DAPT for one year. Patient is a candidate for same day discharge.  Findings Coronary Findings Diagnostic  Dominance: Right  Left Main Vessel was injected. Vessel is normal in caliber. Vessel is angiographically normal.  Left Anterior Descending Previously placed Mid LAD stent (unknown type) is widely patent.  Left Circumflex  First  Obtuse Marginal Branch Vessel is small in size.  Second Obtuse Marginal Branch Ost 2nd Mrg lesion is 95% stenosed. The lesion is discrete.  Right Coronary Artery Prox RCA to Mid RCA lesion is 15% stenosed. The lesion was previously treated using a drug eluting stent over 2 years ago.  Intervention  Ost 2nd Mrg lesion Stent CATHETER LAUNCHER 6FREBU 3.5 guide catheter was inserted. Lesion crossed with guidewire using a WIRE ASAHI PROWATER 180CM. Pre-stent angioplasty was performed using a BALLOON EMERGE MR 2.0X12. A drug-eluting stent was successfully placed using a STENT RESOLUTE ONYX 2.5X12. Stent strut is well apposed. Post-stent angioplasty was performed using a BALLOON [place] EUPHORA RX2.75X8. Maximum pressure:  16 atm. Post-Intervention Lesion Assessment The intervention was successful. Pre-interventional TIMI flow is 3. Post-intervention TIMI flow is 3. No complications occurred at this lesion. There is a 0% residual stenosis post intervention.   STRESS TESTS  MYOCARDIAL PERFUSION IMAGING 07/16/2018  Narrative  Clinically negative, electrically positive for ischemia  Horizontal ST segment depression ST segment depression was noted during stress in the II, III and aVF leads, beginning at 6 minutes of stress, and returning to baseline after 1-5 minutes of recovery.  Hypertensive response  Normal perfusion.  LVEF 59%  Low risk scan   ECHOCARDIOGRAM  ECHOCARDIOGRAM COMPLETE 04/01/2021  Narrative ECHOCARDIOGRAM REPORT    Patient Name:   [name] Date of Exam: 04/01/2021 Medical Rec #:  [id number]      Height:       69.0 in Accession #:    [id number]     Weight:       224.0 lb Date of Birth:  March 07, 1951     BSA:          2.168 m Patient Age:    72 years       BP:           144/87 mmHg Patient Gender: M              HR:           71 bpm. Exam Location:  [address]  Procedure: 2D Echo, 3D Echo, Cardiac Doppler and Color Doppler  Indications:    I25.10  CAD  History:        Patient has prior history of Echocardiogram examinations, most recent 04/28/2018. Signs/Symptoms:Shortness of Breath and Edema; Risk Factors:Hypertension and HLD.  Sonographer:    Clearence Ped RCS Referring Phys: [id number] PETER M [place]iland  IMPRESSIONS   1. Left ventricular ejection fraction, by estimation, is 55 to 60%. The left ventricle has normal function. The left ventricle has no regional wall motion abnormalities. There is mild left ventricular hypertrophy. Left ventricular diastolic parameters were normal. 2. Right ventricular systolic function is normal. The right ventricular size is normal. There is normal pulmonary artery systolic pressure. 3. The mitral valve is normal in structure. No evidence of mitral valve regurgitation. No evidence of mitral stenosis. 4. The aortic valve is tricuspid. Aortic valve regurgitation  is not visualized. Mild aortic valve sclerosis is present, with no evidence of aortic valve stenosis. 5. The inferior vena cava is normal in size with greater than 50% respiratory variability, suggesting right atrial pressure of 3 mmHg.  FINDINGS Left Ventricle: Left ventricular ejection fraction, by estimation, is 55 to 60%. The left ventricle has normal function. The left ventricle has no regional wall motion abnormalities. The left ventricular internal cavity size was normal in size. There is mild left ventricular hypertrophy. Left ventricular diastolic parameters were normal.  Right Ventricle: The right ventricular size is normal. No increase in right ventricular wall thickness. Right ventricular systolic function is normal. There is normal pulmonary artery systolic pressure. The tricuspid regurgitant velocity is 1.88 m/s, and with an assumed right atrial pressure of 3 mmHg, the estimated right ventricular systolic pressure is 17.1 mmHg.  Left Atrium: Left atrial size was normal in size.  Right Atrium: Right atrial size was normal in  size.  Pericardium: There is no evidence of pericardial effusion.  Mitral Valve: The mitral valve is normal in structure. No evidence of mitral valve regurgitation. No evidence of mitral valve stenosis.  Tricuspid Valve: The tricuspid valve is normal in structure. Tricuspid valve regurgitation is not demonstrated. No evidence of tricuspid stenosis.  Aortic Valve: The aortic valve is tricuspid. Aortic valve regurgitation is not visualized. Mild aortic valve sclerosis is present, with no evidence of aortic valve stenosis.  Pulmonic Valve: The pulmonic valve was normal in structure. Pulmonic valve regurgitation is not visualized. No evidence of pulmonic stenosis.  Aorta: The aortic root is normal in size and structure.  Venous: The inferior vena cava is normal in size with greater than 50% respiratory variability, suggesting right atrial pressure of 3 mmHg.  IAS/Shunts: No atrial level shunt detected by color flow Doppler.   LEFT VENTRICLE PLAX 2D LVIDd:         4.10 cm  Diastology LVIDs:         3.00 cm  LV e' medial:    6.42 cm/s LV PW:         1.00 cm  LV E/e' medial:  11.1 LV IVS:        1.20 cm  LV e' lateral:   7.83 cm/s LVOT diam:     2.00 cm  LV E/e' lateral: 9.1 LV SV:         70 LV SV Index:   32 LVOT Area:     3.14 cm   RIGHT VENTRICLE RV Basal diam:  3.60 cm RV S prime:     13.10 cm/s TAPSE (M-mode): 2.5 cm RVSP:           17.1 mmHg  LEFT ATRIUM             Index       RIGHT ATRIUM           Index LA diam:        3.60 cm 1.66 cm/m  RA Pressure: 3.00 mmHg LA Vol (A2C):   39.9 ml 18.40 ml/m RA Area:     12.80 cm LA Vol (A4C):   50.7 ml 23.39 ml/m RA Volume:   25.70 ml  11.85 ml/m LA Biplane Vol: 45.5 ml 20.99 ml/m AORTIC VALVE LVOT Vmax:   109.00 cm/s LVOT Vmean:  83.400 cm/s LVOT VTI:    0.223 m  AORTA Ao Root diam: 3.40 cm Ao Asc diam:  3.20 cm  MITRAL VALVE  TRICUSPID VALVE MV Area (PHT):             TR Peak grad:   14.1 mmHg MV Decel  Time:             TR Vmax:        188.00 cm/s MV E velocity: 71.10 cm/s  Estimated RAP:  3.00 mmHg MV A velocity: 90.40 cm/s  RVSP:           17.1 mmHg MV E/A ratio:  0.79 SHUNTS Systemic VTI:  0.22 m Systemic Diam: 2.00 cm  Charlton Haws MD Electronically signed by Charlton Haws MD Signature Date/Time: 04/01/2021/11:08:17 AM    Final            Recent Labs: No results found for requested labs within last 365 days.  Recent Lipid Panel No results found for: "CHOL", "TRIG", "HDL", "CHOLHDL", "VLDL", "LDLCALC", "LDLDIRECT"  History of Present Illness    72 year old male with the above past medical history including CAD with prior stenting to the mid LAD and RCA in 2004,  DES-OM2 in 2020, hypertension, hyperlipidemia, OSA on CPAP, iron deficiency anemia and hypothyroidism.  He has a history of CAD with prior interventions as above.  Myoview in 2011 was low risk.  Repeat cardiac catheterization 2011 showed nonobstructive disease and PAD with stents.  Myoview in 2016 and 2019 was low risk.  He ultimately underwent repeat cardiac catheterization in 11/2018 which revealed patent LAD and RCA stents, mild 15% ISR in the mid RCA stent, 95% stenosis in the OM 2 s/p DES.  Echocardiogram in 2022 EF 55 to 60%, normal LV function, no RWMA, mild LVH, normal RV systolic function, normal PASP, no significant valvular abnormalities.  His last seen in the office on 09/03/2022 and was stable from a cardiac standpoint.  He noted brief sharp chest pain in his left chest that lasted only seconds at a time, he denied exertional symptoms concerning for angina.  He contacted our office on 08/18/2023 with concern for increased dyspnea on exertion increased bilateral lower extremity edema.  He presents today for follow-up. Since his last visit and since he contacted our office he has been stable from a cardiac standpoint.  However, over the past 2 to 3 weeks he has noted increased dyspnea on exertion.  He was recently  diagnosed with iron deficiency anemia per PCP, his potassium was also low.  He is due to receive 2 iron infusions in the coming weeks.  He denies any chest pain.  He reports 1 episode of fleeting palpitations.  He has mild orthostatic dizziness (this is not new), he denies any presyncope or syncope.  He has been taking Lasix 20 mg daily as needed for swelling, weight gain.  He denies PND, orthopnea.    Home Medications    Current Outpatient Medications  Medication Sig Dispense Refill   amLODipine (NORVASC) 5 MG tablet TAKE ONE TABLET BY MOUTH EVERY DAY 90 tablet 0   ARNUITY ELLIPTA 100 MCG/ACT AEPB 1 puff Inhalation Once a day as directed and rinse mouth with water after use for 30 days     aspirin 81 MG tablet Take 81 mg by mouth daily.     buPROPion (WELLBUTRIN XL) 300 MG 24 hr tablet Take 300 mg by mouth daily.     chlorthalidone (HYGROTON) 25 MG tablet TAKE 1 TABLET ONCE DAILY. 90 tablet 0   cholecalciferol (VITAMIN D3) 25 MCG (1000 UT) tablet Take 1,000 Units by mouth daily.     Coenzyme  Q10 (COQ10) 100 MG CAPS Take 100 mg by mouth daily.     cyanocobalamin (,VITAMIN B-12,) 1000 MCG/ML injection Inject 1,000 mcg into the muscle every 30 (thirty) days.     ezetimibe (ZETIA) 10 MG tablet TAKE ONE TABLET ONCE DAILY 90 tablet 3   fluticasone (CUTIVATE) 0.05 % cream Apply 1 application topically daily as needed (rash).      irbesartan (AVAPRO) 300 MG tablet Take 1 tablet (300 mg total) by mouth daily.     ketoconazole (NIZORAL) 2 % shampoo Apply 1 application topically 3 (three) times a week.      levothyroxine (SYNTHROID, LEVOTHROID) 200 MCG tablet Take 200 mcg by mouth See admin instructions.      pantoprazole (PROTONIX) 40 MG tablet 1 tablet 1/2 to 1 hour before morning meal Orally Once a day for 30 days     potassium chloride (KLOR-CON) 10 MEQ tablet 1 tablet with food Orally 3 times a week on mon, wed, friday.     REPATHA SURECLICK 140 MG/ML SOAJ Inject 1 Syringe into the skin every 14  (fourteen) days.     sildenafil (REVATIO) 20 MG tablet Take 80 mg by mouth daily as needed (ED).      tadalafil (CIALIS) 10 MG tablet 1-2 tablet as needed before sexual activity Orally Once a day for 30 days     testosterone cypionate (DEPOTESTOSTERONE CYPIONATE) 200 MG/ML injection testosterone cypionate 200 mg/mL intramuscular oil     traZODone (DESYREL) 50 MG tablet Take 1 tablet (50 mg total) by mouth at bedtime as needed for sleep. 30 tablet 5   trimethoprim-polymyxin b (POLYTRIM) ophthalmic solution 1 drop into affected eye Ophthalmic Four times a day for 5 days     furosemide (LASIX) 20 MG tablet Take 1 tablet daily as needed for swelling or weight gain. 90 tablet 3   nitroGLYCERIN (NITROSTAT) 0.4 MG SL tablet Place 1 tablet (0.4 mg total) under the tongue every 5 (five) minutes as needed for chest pain. 25 tablet 3   No current facility-administered medications for this visit.     Review of Systems    He denies chest pain, palpitations, pnd, orthopnea, n, v, dizziness, syncope, weight gain, or early satiety. All other systems reviewed and are otherwise negative except as noted above.   Physical Exam    VS:  BP 110/62 (BP Location: Right Arm, Patient Position: Sitting, Cuff Size: Large)   Pulse 68   Ht 5\' 9"  (1.753 m)   Wt 224 lb (101.6 kg)   SpO2 96%   BMI 33.08 kg/m  GEN: Well nourished, well developed, in no acute distress. HEENT: normal. Neck: Supple, no JVD, carotid bruits, or masses. Cardiac: RRR, no murmurs, rubs, or gallops. No clubbing, cyanosis, edema.  Radials/DP/PT 2+ and equal bilaterally.  Respiratory:  Respirations regular and unlabored, clear to auscultation bilaterally. GI: Soft, nontender, nondistended, BS + x 4. MS: no deformity or atrophy. Skin: warm and dry, no rash. Neuro:  Strength and sensation are intact. Psych: Normal affect.  Accessory Clinical Findings    ECG personally reviewed by me today - EKG Interpretation Date/Time:  Tuesday August 25 2023 15:17:17 EST Ventricular Rate:  64 PR Interval:  246 QRS Duration:  94 QT Interval:  406 QTC Calculation: 418 R Axis:   -9  Text Interpretation: Sinus rhythm with 1st degree A-V block Minimal voltage criteria for LVH, may be normal variant ( R in aVL ) When compared with ECG of 23-Nov-2018 12:24, No significant change  was found Confirmed by Bernadene Person ([id number]) on 08/25/2023 3:18:25 PM  - no acute changes.   Lab Results  Component Value Date   WBC 5.2 11/17/2018   HGB 13.2 11/17/2018   HCT 40.2 11/17/2018   MCV 81 11/17/2018   PLT 285 11/17/2018   Lab Results  Component Value Date   CREATININE 0.92 06/20/2020   BUN 17 06/20/2020   NA 141 06/20/2020   K 4.4 06/20/2020   CL 98 06/20/2020   CO2 28 06/20/2020   No results found for: "ALT", "AST", "GGT", "ALKPHOS", "BILITOT" No results found for: "CHOL", "HDL", "LDLCALC", "LDLDIRECT", "TRIG", "CHOLHDL"  No results found for: "HGBA1C"  Assessment & Plan    1. Dyspnea on exertion/lower extremity edema: Echo in 2022 EF 55 to 60%, normal LV function, no RWMA, mild LVH, normal RV systolic function, normal PASP, no significant valvular abnormalities.  Recently diagnosed with iron deficiency anemia.  He is pending iron transfusions per PCP. We discussed possible ischemic evaluation with cardiac PET stress test, he declines at this time.  Suspect anemia is also contributing to his symptoms.  Will check echo.  Will request most recent labs from PCP.  Reviewed ED precautions.  He has also noted some lower extremity edema, improved with as needed Lasix.  Will renew prescription for Lasix 20 mg daily to take as needed for swelling, weight gain.  He states he will have repeat labs with his PCP.  2. CAD: S/p prior stenting to the mid LAD and RCA in 2004,  DES-OM2 in 2020.  Most recent echo as above.  He notes a several week history of increased dyspnea on exertion.  Recent anemia as above.  He declines ischemic evaluation today.  Will update echo  as above.  Continue to monitor symptoms.  Continue aspirin, amlodipine, chlorthalidone, irbesartan, Repatha, and Zetia.  3. Hypertension: BP well controlled. Continue current antihypertensive regimen.   4. Hyperlipidemia: LDL was 16 in 07/2022.  Consider repeat labs at follow-up.  Continue Repatha, Zetia.  5. OSA: Adherent to CPAP.   6. Disposition: Follow-up in 6 to 8 weeks.      Joylene Grapes, NP 08/25/2023, 4:41 PM

## 2023-08-26 ENCOUNTER — Other Ambulatory Visit (HOSPITAL_COMMUNITY): Payer: Self-pay | Admitting: *Deleted

## 2023-08-26 ENCOUNTER — Other Ambulatory Visit: Payer: Self-pay | Admitting: Cardiology

## 2023-08-27 ENCOUNTER — Encounter (HOSPITAL_COMMUNITY)
Admission: RE | Admit: 2023-08-27 | Discharge: 2023-08-27 | Disposition: A | Discharge status: Home / Self Care | Payer: Medicare Other | Source: Ambulatory Visit | Attending: Internal Medicine | Admitting: Internal Medicine

## 2023-08-27 DIAGNOSIS — D649 Anemia, unspecified: Secondary | ICD-10-CM | POA: Diagnosis present

## 2023-08-27 MED ORDER — SODIUM CHLORIDE 0.9 % IV SOLN
510.0000 mg | INTRAVENOUS | Status: DC
  Administered 2023-08-27: 510 mg via INTRAVENOUS
  Filled 2023-08-27: qty 510

## 2023-08-28 ENCOUNTER — Encounter: Payer: Self-pay | Admitting: Gastroenterology

## 2023-09-03 ENCOUNTER — Encounter (HOSPITAL_COMMUNITY)
Admission: RE | Admit: 2023-09-03 | Discharge: 2023-09-03 | Disposition: A | Discharge status: Home / Self Care | Payer: Medicare Other | Source: Ambulatory Visit | Attending: Internal Medicine | Admitting: Internal Medicine

## 2023-09-03 DIAGNOSIS — D649 Anemia, unspecified: Secondary | ICD-10-CM | POA: Diagnosis not present

## 2023-09-03 MED ORDER — SODIUM CHLORIDE 0.9 % IV SOLN
510.0000 mg | INTRAVENOUS | Status: DC
  Administered 2023-09-03: 510 mg via INTRAVENOUS
  Filled 2023-09-03: qty 510

## 2023-09-03 NOTE — Progress Notes (Unsigned)
Set up date 09/01/2022

## 2023-09-07 ENCOUNTER — Other Ambulatory Visit: Payer: Medicare Other

## 2023-09-07 ENCOUNTER — Telehealth: Payer: Medicare Other | Admitting: Adult Health

## 2023-09-17 ENCOUNTER — Ambulatory Visit: Payer: Medicare Other | Admitting: Cardiology

## 2023-09-21 ENCOUNTER — Other Ambulatory Visit: Payer: Self-pay | Admitting: Cardiology

## 2023-09-22 ENCOUNTER — Encounter: Payer: Self-pay | Admitting: Cardiology

## 2023-09-23 NOTE — Progress Notes (Signed)
 [name] Bring Date of Birth: [birth year]  History of Present Illness: [name] is seen for followup of his coronary disease and leg swelling. He has a history of stenting of the mid LAD and mid right coronary in 2004 with DES. In 2011 he had a nuclear stress test. He was able to exercise for 8 minutes with significant ST segment changes. Perfusion imaging was normal. He did undergo repeat cardiac catheterization which showed nonobstructive disease and continued excellent patency of his stents. Repeat myoview  in 2016 still showed ST segment changes but normal perfusion and normal EF.   Followed by Dr. Chalice for evaluation of snoring and REM dependent apnea. Fitted for a dental device.   He was seen by [name] in August 2019 for dyspnea.  Echocardiogram obtained on 04/28/2018 demonstrated normal EF 60 to 65%, with grade 1 DD.  He returned in October 2019 with worsening dyspnea.  Myoview  was repeated on 07/16/2018 which showed horizontal ST depression in inferior leads, hypertensive response, normal perfusion, EF 59%. He was seen on on 11/16/2018, at which time he continued to have significant left-sided chest pain with exertion.  Symptom was concerning for unstable angina.  He had cardiac catheterization which was performed on 11/23/2018, this revealed a 95% ostial OM 2 lesion treated with resolute Onyx 2.5 x 12 mm DES, 15% proximal to mid RCA residual, widely patent mid LAD stent.  On follow up today he is doing very well. No chest pain or dyspnea. Mainly swims for activity and can swim 30 laps. No significant edema. No palpitations.   Current Outpatient Medications on File Prior to Visit  Medication Sig Dispense Refill   amLODipine  (NORVASC ) 5 MG tablet TAKE ONE TABLET BY MOUTH EVERY DAY 90 tablet 0   ARNUITY ELLIPTA 100 MCG/ACT AEPB 1 puff Inhalation Once a day as directed and rinse mouth with water after use for 30 days     aspirin  81 MG tablet Take 81 mg by mouth daily.     buPROPion   (WELLBUTRIN  XL) 300 MG 24 hr tablet Take 300 mg by mouth daily.     chlorthalidone  (HYGROTON ) 25 MG tablet TAKE 1 TABLET ONCE DAILY. 90 tablet 3   cholecalciferol (VITAMIN D3) 25 MCG (1000 UT) tablet Take 1,000 Units by mouth daily.     Coenzyme Q10 (COQ10) 100 MG CAPS Take 100 mg by mouth daily.     cyanocobalamin  (,VITAMIN B-12,) 1000 MCG/ML injection Inject 1,000 mcg into the muscle every 30 (thirty) days.     ezetimibe  (ZETIA ) 10 MG tablet TAKE ONE TABLET ONCE DAILY 90 tablet 3   fluticasone (CUTIVATE) 0.05 % cream Apply 1 application topically daily as needed (rash).      furosemide  (LASIX ) 20 MG tablet Take 1 tablet daily as needed for swelling or weight gain. 90 tablet 3   irbesartan  (AVAPRO ) 300 MG tablet Take 1 tablet (300 mg total) by mouth daily.     ketoconazole (NIZORAL) 2 % shampoo Apply 1 application topically 3 (three) times a week.      levothyroxine  (SYNTHROID , LEVOTHROID) 200 MCG tablet Take 200 mcg by mouth See admin instructions.      nitroGLYCERIN  (NITROSTAT ) 0.4 MG SL tablet DISSOLVE 1 TABLET UNDER TONGUE EVERY 5 MINUTES AS NEEDED FOR CHEST PAIN. DO NOT EXCEED 3 DOSES. 25 tablet 1   pantoprazole (PROTONIX) 40 MG tablet 1 tablet 1/2 to 1 hour before morning meal Orally Once a day for 30 days     potassium chloride (KLOR-CON) 10  MEQ tablet 1 tablet with food Orally 3 times a week on mon, wed, friday.     REPATHA SURECLICK 140 MG/ML SOAJ Inject 1 Syringe into the skin every 14 (fourteen) days.     sildenafil (REVATIO) 20 MG tablet Take 80 mg by mouth daily as needed (ED).      tadalafil (CIALIS) 10 MG tablet 1-2 tablet as needed before sexual activity Orally Once a day for 30 days     testosterone  cypionate (DEPOTESTOSTERONE CYPIONATE) 200 MG/ML injection testosterone  cypionate 200 mg/mL intramuscular oil     traZODone  (DESYREL ) 50 MG tablet Take 1 tablet (50 mg total) by mouth at bedtime as needed for sleep. 30 tablet 5   trimethoprim-polymyxin b (POLYTRIM) ophthalmic solution  1 drop into affected eye Ophthalmic Four times a day for 5 days     No current facility-administered medications on file prior to visit.    Allergies  Allergen Reactions   Atorvastatin     Other reaction(s): ck enzyme up and  muscle aches.    Past Medical History:  Diagnosis Date   Asthma    Benign paroxysmal positional vertigo    Cancer (HCC)    skin cancer   Coronary artery disease 2004   with prior stenting of the mid LAD and mid right coronary  / cardiac cath with satifsfactory finding following abnormal stress test   Depressive disorder, not elsewhere classified    Gastritis    Gluten enteropathy    Heart palpitations    went to ER on 04/27/2006 for palpitations   Hypercholesteremia    Hypertension    Hypothyroidism    Iron deficiency anemia, unspecified    Myalgia and myositis, unspecified    Personal history of colonic polyps 10/2006   hyperplastic polyp   Sleep apnea    uses CPAP   SOB (shortness of breath)     Past Surgical History:  Procedure Laterality Date   CARDIAC CATHETERIZATION  08/25/2003   EF estimated 65% / stenting of the mid left  anterior descending and mid right coronary artery in 2004 with drug-eluting stent/Two vessel obstructive atherosclerotic coronary artery disease. / Normal left ventricular function. / Successful stenting of the mid right coronary artery and the mid left anterior descending   CARDIAC CATHETERIZATION  07/17/2010   EF estimated at 65% / Left ventricular angiography was performed in the RAO view  This demonstrates normal left ventricular size and contractility with normal  systolic function / Nonobstructive atherosclerotic coronary disease with continued  excellent patency of the stents in the mid LAD & mid right coronary artery / Normal left ventricular function   COLONOSCOPY     CORONARY STENT INTERVENTION N/A 11/23/2018   Procedure: CORONARY STENT INTERVENTION;  Surgeon: Nari Vannatter M, MD;  Location: [facility] INVASIVE CV LAB;   Service: Cardiovascular;  Laterality: N/A;   LEFT HEART CATH AND CORONARY ANGIOGRAPHY N/A 11/23/2018   Procedure: LEFT HEART CATH AND CORONARY ANGIOGRAPHY;  Surgeon: Oreoluwa Gilmer M, MD;  Location: [facility] INVASIVE CV LAB;  Service: Cardiovascular;  Laterality: N/A;   MENISCUS REPAIR Left    OSTEOTOMY     removal of bone spur on (L) arm   POLYPECTOMY     ROTATOR CUFF REPAIR Right    SHOULDER ARTHROSCOPY W/ ROTATOR CUFF REPAIR Left     Social History   Tobacco Use  Smoking Status Former   Current packs/day: 0.00   Average packs/day: 1 pack/day for 20.0 years (20.0 ttl pk-yrs)   Types: Cigarettes  Start date: 09/22/1980   Quit date: 09/22/2000   Years since quitting: 23.0  Smokeless Tobacco Never    Social History   Substance and Sexual Activity  Alcohol Use Yes   Alcohol/week: 3.0 standard drinks of alcohol   Types: 3 Glasses of wine per week    Family History  Problem Relation Age of Onset   Pancreatic cancer Mother    Heart attack Father        x2   Prostate cancer Father    Stroke Brother    Coronary artery disease Brother    Diabetes Brother    Coronary artery disease Brother    Prostate cancer Paternal Uncle    Colon cancer Neg Hx    Esophageal cancer Neg Hx    Stomach cancer Neg Hx     Review of Systems: As noted in history of present illness. All other systems were reviewed and are negative.  Physical Exam: BP (!) 118/58 (BP Location: Left Arm, Patient Position: Sitting, Cuff Size: Normal)   Pulse 77   Ht 5' 9 (1.753 m)   Wt 222 lb (100.7 kg)   SpO2 96%   BMI 32.78 kg/m  GENERAL:  Well appearing, overweight WM HEENT:  PERRL, EOMI, sclera are clear. Oropharynx is clear. NECK:  No jugular venous distention, carotid upstroke brisk and symmetric, no bruits, no thyromegaly or adenopathy LUNGS:  Clear to auscultation bilaterally CHEST:  Unremarkable HEART:  RRR,  PMI not displaced or sustained,S1 and S2 within normal limits, no S3, no S4: no clicks, no rubs,  no murmurs ABD:  Soft, nontender. BS +, no masses or bruits. No hepatomegaly, no splenomegaly EXT:  2 + pulses throughout, no edema. no cyanosis no clubbing SKIN:  Warm and dry.  No rashes NEURO:  Alert and oriented x 3. Cranial nerves II through XII intact. PSYCH:  Cognitively intact    LABORATORY DATA: Lab Results  Component Value Date   WBC 5.2 11/17/2018   HGB 13.2 11/17/2018   HCT 40.2 11/17/2018   PLT 285 11/17/2018   GLUCOSE 72 06/20/2020   NA 141 06/20/2020   K 4.4 06/20/2020   CL 98 06/20/2020   CREATININE 0.92 06/20/2020   BUN 17 06/20/2020   CO2 28 06/20/2020   TSH 8.01 (H) 07/22/2021   No results found for: CHOL, HDL, LDLCALC, LDLDIRECT, TRIG, CHOLHDL   Dated 08/20/16: cholesterol 146, triglycerides 127, HDL 35, LDL 86. Chemistries, TSH, CBC normal. Dated 12/30/18: cholesterol 128, triglycerides 98, HDL 33, LDL 75. A1c 5.1%. CMET, TSH, CBC normal Dated 02/24/17 A1c 5.2%. Dated 09/22/19: cholesterol 85, triglycerides 198, HDL 40, LDL 5. A1c 5.3%. CBC and TSH normal. Dated 01/30/20: cholesterol 147, triglycerides 204, HDL 41, LDL 65. Normal CBC, CMET Dated 11/27/20: A1c 5.4% Dated 08/20/22: cholesterol 92, triglycerides 205, HDL 35, LDL 16.  Dated 05/12/23: 5.3%.  Dated 08/18/23: potassium 2.9 otherwise CMET normal   Myoview  05/04/15: Study Highlights   The left ventricular ejection fraction is normal (55-65%). Nuclear stress EF: 61%. Blood pressure demonstrated a hypertensive response to exercise. Horizontal ST segment depression ST segment depression of 2 mm was noted during stress in the III, aVF and II leads. The study is normal. This is a low risk study.   Low risk stress nuclear study with normal perfusion and normal left ventricular regional and global systolic function. Abnormal ECG response probably represents a false positive study, possibly related to hypertensive response to exercise.   Myoview  07/16/18: Study Highlights  Clinically  negative,  electrically positive for ischemia Horizontal ST segment depression ST segment depression was noted during stress in the II, III and aVF leads, beginning at 6 minutes of stress, and returning to baseline after 1-5 minutes of recovery. Hypertensive response Normal perfusion. LVEF 59% Low risk scan   Echo 04/28/18: Study Conclusions   - Left ventricle: The cavity size was normal. Wall thickness was   normal. Systolic function was normal. The estimated ejection   fraction was in the range of 60% to 65%. Wall motion was normal;   there were no regional wall motion abnormalities. Doppler   parameters are consistent with abnormal left ventricular   relaxation (grade 1 diastolic dysfunction). - Mitral valve: Calcified annulus.   Impressions:   - Normal LV systolic function; mild diastolic dysfunction.  Procedures  CORONARY STENT INTERVENTION  LEFT HEART CATH AND CORONARY ANGIOGRAPHY  Conclusion    Prox RCA to Mid RCA lesion is 15% stenosed. Previously placed Mid LAD stent (unknown type) is widely patent. Ost 2nd Mrg lesion is 95% stenosed. Post intervention, there is a 0% residual stenosis. A drug-eluting stent was successfully placed using a STENT RESOLUTE ONYX 2.5X12. The left ventricular systolic function is normal. LV end diastolic pressure is normal. The left ventricular ejection fraction is 55-65% by visual estimate.   1. Single vessel obstructive CAD with 95% OM 2 lesion. Prior stents in the LAD and RCA are patent.  2. Normal LV function 3. Normal LVEDP 4. Successful PCI of the OM2 with DES   Plan: DAPT for one year. Patient is a candidate for same day discharge.       Echo 04/01/21: IMPRESSIONS     1. Left ventricular ejection fraction, by estimation, is 55 to 60%. The  left ventricle has normal function. The left ventricle has no regional  wall motion abnormalities. There is mild left ventricular hypertrophy.  Left ventricular diastolic parameters  were  normal.   2. Right ventricular systolic function is normal. The right ventricular  size is normal. There is normal pulmonary artery systolic pressure.   3. The mitral valve is normal in structure. No evidence of mitral valve  regurgitation. No evidence of mitral stenosis.   4. The aortic valve is tricuspid. Aortic valve regurgitation is not  visualized. Mild aortic valve sclerosis is present, with no evidence of  aortic valve stenosis.   5. The inferior vena cava is normal in size with greater than 50%  respiratory variability, suggesting right atrial pressure of 3 mmHg.    Assessment / Plan: 1. Coronary disease. Status post stenting of the mid LAD and RCA in 2004 with DES. Cardiac catheterization in 2011 showed patent stents. Myoview  in 2016 and November 2019 were normal. Presented with unstable angina in March 2020 with DES of OM branch.  He is asymptomatic.   2. HTN- well controlled.   3. LE edema. Resolved. Echo and LE dopplers negative. May have been related to higher amlodipine  dose. Now rarely uses lasix .   4. Hypercholesterolemia. On Repatha and Zetia . Statin intolerance due to myalgias. Labs followed by Dr Shayne  4. Hypothyroidism.   I will follow up in one year

## 2023-10-01 ENCOUNTER — Encounter: Payer: Self-pay | Admitting: Cardiology

## 2023-10-01 ENCOUNTER — Ambulatory Visit: Payer: Medicare Other | Attending: Cardiology | Admitting: Cardiology

## 2023-10-01 VITALS — BP 118/58 | HR 77 | Ht 69.0 in | Wt 222.0 lb

## 2023-10-01 DIAGNOSIS — E785 Hyperlipidemia, unspecified: Secondary | ICD-10-CM | POA: Diagnosis not present

## 2023-10-01 DIAGNOSIS — I10 Essential (primary) hypertension: Secondary | ICD-10-CM | POA: Diagnosis not present

## 2023-10-01 DIAGNOSIS — I2510 Atherosclerotic heart disease of native coronary artery without angina pectoris: Secondary | ICD-10-CM | POA: Diagnosis not present

## 2023-10-01 NOTE — Patient Instructions (Signed)
 Medication Instructions:  Continue same medications *If you need a refill on your cardiac medications before your next appointment, please call your pharmacy*   Lab Work: None ordered   Testing/Procedures: None ordered   Follow-Up: At [facility], you and your health needs are our priority.  As part of our continuing mission to provide you with exceptional heart care, we have created designated Provider Care Teams.  These Care Teams include your primary Cardiologist (physician) and Advanced Practice Providers (APPs -  Physician Assistants and Nurse Practitioners) who all work together to provide you with the care you need, when you need it.  We recommend signing up for the patient portal called "MyChart".  Sign up information is provided on this After Visit Summary.  MyChart is used to connect with patients for Virtual Visits (Telemedicine).  Patients are able to view lab/test results, encounter notes, upcoming appointments, etc.  Non-urgent messages can be sent to your provider as well.   To learn more about what you can do with MyChart, go to [email or web address].    Your next appointment:  1 year   Call in Oct to schedule Jan appointment     Provider:  Dr.Jordan

## 2023-10-05 ENCOUNTER — Ambulatory Visit (HOSPITAL_COMMUNITY)
Admission: RE | Admit: 2023-10-05 | Payer: Medicare Other | Source: Ambulatory Visit | Attending: Nurse Practitioner | Admitting: Nurse Practitioner

## 2023-10-14 ENCOUNTER — Ambulatory Visit: Payer: Medicare Other | Admitting: Physician Assistant

## 2023-11-10 ENCOUNTER — Other Ambulatory Visit: Payer: Self-pay | Admitting: Cardiology

## 2023-12-03 ENCOUNTER — Ambulatory Visit
Admission: RE | Admit: 2023-12-03 | Discharge: 2023-12-03 | Disposition: A | Discharge status: Home / Self Care | Payer: Medicare Other | Source: Ambulatory Visit | Attending: Internal Medicine | Admitting: Internal Medicine

## 2023-12-03 DIAGNOSIS — M48061 Spinal stenosis, lumbar region without neurogenic claudication: Secondary | ICD-10-CM

## 2023-12-15 ENCOUNTER — Telehealth: Payer: Self-pay | Admitting: Adult Health

## 2023-12-15 DIAGNOSIS — G4733 Obstructive sleep apnea (adult) (pediatric): Secondary | ICD-10-CM

## 2023-12-15 NOTE — Telephone Encounter (Signed)
 Printed order, OV and SS to fax.

## 2023-12-15 NOTE — Telephone Encounter (Signed)
 done

## 2023-12-15 NOTE — Telephone Encounter (Signed)
 [facility] [employer] requesting a written order, visit notes, sleep study for CPAP supplies. Fax to: [phone]

## 2023-12-15 NOTE — Telephone Encounter (Signed)
 Faxed to [facility] order, OV, SS [phone] 12 pgs.

## 2023-12-16 NOTE — Telephone Encounter (Signed)
Fax confirmation received. 12 pgs

## 2024-03-01 ENCOUNTER — Ambulatory Visit (HOSPITAL_COMMUNITY): Admission: EM | Admit: 2024-03-01 | Discharge: 2024-03-01 | Discharge status: Against Medical Advice

## 2024-03-01 ENCOUNTER — Encounter (HOSPITAL_COMMUNITY): Payer: Self-pay

## 2024-03-01 ENCOUNTER — Emergency Department (HOSPITAL_COMMUNITY)
Admission: EM | Admit: 2024-03-01 | Discharge: 2024-03-01 | Disposition: A | Discharge status: Home / Self Care | Attending: Emergency Medicine | Admitting: Emergency Medicine

## 2024-03-01 DIAGNOSIS — S61412A Laceration without foreign body of left hand, initial encounter: Secondary | ICD-10-CM | POA: Diagnosis present

## 2024-03-01 DIAGNOSIS — W260XXA Contact with knife, initial encounter: Secondary | ICD-10-CM | POA: Diagnosis not present

## 2024-03-01 DIAGNOSIS — Z23 Encounter for immunization: Secondary | ICD-10-CM | POA: Insufficient documentation

## 2024-03-01 DIAGNOSIS — Z7982 Long term (current) use of aspirin: Secondary | ICD-10-CM | POA: Diagnosis not present

## 2024-03-01 MED ORDER — BACITRACIN ZINC 500 UNIT/GM EX OINT
TOPICAL_OINTMENT | CUTANEOUS | Status: AC
  Filled 2024-03-01: qty 0.9

## 2024-03-01 MED ORDER — LIDOCAINE-EPINEPHRINE (PF) 2 %-1:200000 IJ SOLN
20.0000 mL | Freq: Once | INTRAMUSCULAR | Status: AC
  Administered 2024-03-01: 20 mL
  Filled 2024-03-01: qty 20

## 2024-03-01 MED ORDER — TETANUS-DIPHTH-ACELL PERTUSSIS 5-2.5-18.5 LF-MCG/0.5 IM SUSY
0.5000 mL | PREFILLED_SYRINGE | Freq: Once | INTRAMUSCULAR | Status: AC
  Administered 2024-03-01: 0.5 mL via INTRAMUSCULAR
  Filled 2024-03-01: qty 0.5

## 2024-03-01 NOTE — ED Provider Notes (Signed)
 [facility] EM[facility] AT [facility] Provider Note   CSN: [id number] Arrival date & time: 03/01/24  1608     History  Chief Complaint  Patient presents with   Extremity Laceration    [name] is a 73 y.o. male who presents today with a laceration on the dorsum of the left hand after cutting it with a knife that he was using to cut a plastic tie.  Hemostasis achieved prior to arrival with direct pressure, he does not take any anticoagulants other than daily aspirin .  He does not have any numbness or tingling to the affected fingers, denies any weakness or inability to move the affected thumb and forefinger.  Endorses flushing wound with copious amounts of water immediately after injury.  HPI     Home Medications Prior to Admission medications   Medication Sig Start Date End Date Taking? Authorizing Provider  amLODipine  (NORVASC ) 5 MG tablet TAKE ONE TABLET BY MOUTH EVERY DAY 11/12/23   [place], Peter M, MD  ARNUITY ELLIPTA 100 MCG/ACT AEPB 1 puff Inhalation Once a day as directed and rinse mouth with water after use for 30 days 08/27/22   [provider]  aspirin  81 MG tablet Take 81 mg by mouth daily.    [provider]  buPROPion  (WELLBUTRIN  XL) 300 MG 24 hr tablet Take 300 mg by mouth daily. 10/19/18   [provider]  chlorthalidone  (HYGROTON ) 25 MG tablet TAKE 1 TABLET ONCE DAILY. 09/21/23   [place], Peter M, MD  cholecalciferol (VITAMIN D3) 25 MCG (1000 UT) tablet Take 1,000 Units by mouth daily.    [provider]  Coenzyme Q10 (COQ10) 100 MG CAPS Take 100 mg by mouth daily.    [provider]  cyanocobalamin  (,VITAMIN B-12,) 1000 MCG/ML injection Inject 1,000 mcg into the muscle every 30 (thirty) days.    [provider]  ezetimibe  (ZETIA ) 10 MG tablet TAKE ONE TABLET ONCE DAILY 08/17/23   [place], Peter M, MD  fluticasone (CUTIVATE) 0.05 % cream Apply 1 application topically daily as needed (rash).   10/15/18   [provider]  furosemide  (LASIX ) 20 MG tablet Take 1 tablet daily as needed for swelling or weight gain. 08/25/23   Jude Norton, NP  irbesartan  (AVAPRO ) 300 MG tablet Take 1 tablet (300 mg total) by mouth daily. 02/06/21   [place], Peter M, MD  ketoconazole (NIZORAL) 2 % shampoo Apply 1 application topically 3 (three) times a week.  03/29/14   [provider]  levothyroxine  (SYNTHROID , LEVOTHROID) 200 MCG tablet Take 200 mcg by mouth See admin instructions.     [provider]  nitroGLYCERIN  (NITROSTAT ) 0.4 MG SL tablet DISSOLVE 1 TABLET UNDER TONGUE EVERY 5 MINUTES AS NEEDED FOR CHEST PAIN. DO NOT EXCEED 3 DOSES. 08/27/23   Tania Familia, NP  pantoprazole (PROTONIX) 40 MG tablet 1 tablet 1/2 to 1 hour before morning meal Orally Once a day for 30 days 08/18/23   [provider]  potassium chloride (KLOR-CON) 10 MEQ tablet 1 tablet with food Orally 3 times a week on mon, wed, friday. 05/14/23   [provider]  REPATHA SURECLICK 140 MG/ML SOAJ Inject 1 Syringe into the skin every 14 (fourteen) days. 09/30/19   [provider]  sildenafil (REVATIO) 20 MG tablet Take 80 mg by mouth daily as needed (ED).     [provider]  tadalafil (CIALIS) 10 MG tablet 1-2 tablet as needed before sexual activity Orally Once a  day for 30 days 10/02/22   [provider]  testosterone  cypionate (DEPOTESTOSTERONE CYPIONATE) 200 MG/ML injection testosterone  cypionate 200 mg/mL intramuscular oil    [provider]  traZODone  (DESYREL ) 50 MG tablet Take 1 tablet (50 mg total) by mouth at bedtime as needed for sleep. 04/14/22   Dohmeier, Raoul Byes, MD  trimethoprim-polymyxin b (POLYTRIM) ophthalmic solution 1 drop into affected eye Ophthalmic Four times a day for 5 days 03/11/23   [provider]      Allergies    Atorvastatin    Review of Systems   Review of Systems  Skin:  Positive for wound.  All other systems reviewed  and are negative.   Physical Exam Updated Vital Signs BP 120/88 (BP Location: Left Arm)   Pulse 68   Temp 98.2 F (36.8 C) (Oral)   Resp 16   SpO2 98%  Physical Exam Vitals and nursing note reviewed.  HENT:     Head: Normocephalic and atraumatic.  Eyes:     General: No scleral icterus.    Conjunctiva/sclera: Conjunctivae normal.  Pulmonary:     Effort: Pulmonary effort is normal.  Abdominal:     General: Abdomen is flat.  Musculoskeletal:     Cervical back: Normal range of motion and neck supple.  Skin:    Findings: Laceration present. No rash.     Comments: Approximately 1 to 2 cm linear laceration on the dorsum of the left hand between the thumb and forefinger.  Hemostasis achieved prior to arrival.  Neurological:     Mental Status: He is alert.  Psychiatric:        Mood and Affect: Mood normal.     ED Results / Procedures / Treatments   Labs (all labs ordered are listed, but only abnormal results are displayed) Labs Reviewed - No data to display  EKG None  Radiology No results found.  Procedures .Laceration Repair  Date/Time: 03/01/2024 4:53 PM  Performed by: Juanetta Nordmann, PA Authorized by: Juanetta Nordmann, PA   Consent:    Consent obtained:  Verbal   Consent given by:  Patient   Risks, benefits, and alternatives were discussed: yes     Risks discussed:  Infection, pain, retained foreign body, need for additional repair, poor cosmetic result and poor wound healing   Alternatives discussed:  No treatment, delayed treatment and observation Universal protocol:    Procedure explained and questions answered to patient or proxy's satisfaction: yes     Patient identity confirmed:  Verbally with patient, arm band and hospital-assigned identification number Anesthesia:    Anesthesia method:  Local infiltration   Local anesthetic:  Lidocaine  2% WITH epi Laceration details:    Location:  Hand   Hand location:  L hand, dorsum   Length (cm):  2    Depth (mm):  1 Pre-procedure details:    Preparation:  Patient was prepped and draped in usual sterile fashion Exploration:    Limited defect created (wound extended): no     Hemostasis achieved with:  Direct pressure   Wound exploration: wound explored through full range of motion and entire depth of wound visualized     Wound extent: areolar tissue not violated, fascia not violated, no foreign body, no signs of injury, no nerve damage, no tendon damage and no vascular damage     Contaminated: no   Treatment:    Area cleansed with:  Shur-Clens   Amount of cleaning:  Standard   Irrigation solution:  Sterile water  Irrigation volume:  150   Irrigation method:  Syringe   Visualized foreign bodies/material removed: no     Debridement:  None   Undermining:  None   Scar revision: no   Skin repair:    Repair method:  Sutures   Suture size:  4-0   Suture material:  Prolene   Suture technique:  Simple interrupted   Number of sutures:  3 Approximation:    Approximation:  Close Repair type:    Repair type:  Simple Post-procedure details:    Dressing:  Antibiotic ointment, non-adherent dressing and bulky dressing   Procedure completion:  Tolerated well, no immediate complications     Medications Ordered in ED Medications  lidocaine -EPINEPHrine  (XYLOCAINE  W/EPI) 2 %-1:200000 (PF) injection 20 mL (has no administration in time range)  Tdap (BOOSTRIX) injection 0.5 mL (has no administration in time range)    ED Course/ Medical Decision Making/ A&P                                 Medical Decision Making Risk Prescription drug management.   Medical Decision Making:   LONIE NEWSHAM is a 73 y.o. male who presented to the ED today with laceration to the dorsum of the left detailed above.     Complete initial physical exam performed, notably the patient  was alert and oriented no apparent distress.  Noted approximately 1 to 2 cm laceration to the dorsum of the left hand.  Distal  neurovascular function intact..    Reviewed and confirmed nursing documentation for past medical history, family history, social history.    Initial Assessment:   With the patient's presentation of laceration of dorsum of left hand, most likely diagnosis is laceration.    Initial Plan:  Cleanse wound and prepare for wound closure as noted in the procedure note. Provide tetanus prophylaxis Based on physical exam and clinical history, further workup is deferred at this time.   Reassessment and Plan:   Wound cleansed and closed appropriately as described in the procedure note.  Discussed with patient follow-up with primary care within 7 to 10 days for suture removal, discussed wound care provisions, return precautions given.  Instructions to keep clean and dry, apply bacitracin  needed with dressing changes.          Final Clinical Impression(s) / ED Diagnoses Final diagnoses:  None    Rx / DC Orders ED Discharge Orders     None         Juanetta Nordmann, PA 03/01/24 1658    Russella Courts A, DO 03/09/24 [phone]

## 2024-03-01 NOTE — ED Notes (Signed)
 Pt left and going to ED

## 2024-03-01 NOTE — ED Triage Notes (Signed)
 Pt c/o L hand laceration.  Pain score 2/10.  Pt reports he was trying to cut a plastic tie and cut his hand.   No blood thinner. Last tetanus unknown.

## 2024-03-16 ENCOUNTER — Encounter: Payer: Self-pay | Admitting: Adult Health

## 2024-03-16 ENCOUNTER — Telehealth: Payer: Medicare Other | Admitting: Adult Health

## 2024-03-16 DIAGNOSIS — G4733 Obstructive sleep apnea (adult) (pediatric): Secondary | ICD-10-CM

## 2024-03-16 NOTE — Progress Notes (Signed)
 [facility] [address] [place]. [place] [address] [phone]       OFFICE FOLLOW UP NOTE  [name] Date of Birth:  Jul 28, 1951 Medical Record Number:  [id number]    Primary neurologist: Dr. Chalice Reason for visit: CPAP follow-up  Virtual Visit via Video Note  I connected with [name] on 03/16/24 at  2:15 PM EDT by a video enabled telemedicine application and verified that I am speaking with the correct person using two identifiers.  Location: Patient: at home Provider: in office, [facility]   I discussed the limitations of evaluation and management by telemedicine and the availability of in person appointments. The patient expressed understanding and agreed to proceed.   SUBJECTIVE:   Follow-up visit:  Prior visit: 03/09/2023 with Du[place], NP  Brief HPI:   [name] is a 73 y.o. male who is followed for OSA on CPAP.  Sleep study 04/2022 showed mild to moderate sleep apnea, initially recommended titration study but patient declined and wished to proceed with AutoPap, set up 08/2022.     Interval history:  Patient returns for yearly CPAP visit via MyChart video visit.  Continues to tolerate CPAP well.  He does have ResMed mini which he uses for travel machine.  He continues to have difficulty falling asleep sometimes and at times wakes frequently at night and can have difficulty falling back asleep.  Previously on trazodone  without benefit.  Will occasionally use Tylenol  PM.  Compliance report as below.      ROS:   14 system review of systems performed and negative with exception of those listed in HPI  PMH:  Past Medical History:  Diagnosis Date   Asthma    Benign paroxysmal positional vertigo    Cancer (HCC)    skin cancer   Coronary artery disease 2004   with prior stenting of the mid LAD and mid right coronary  / cardiac cath with satifsfactory finding following abnormal stress test   Depressive disorder, not elsewhere  classified    Gastritis    Gluten enteropathy    Heart palpitations    went to ER on 04/27/2006 for palpitations   Hypercholesteremia    Hypertension    Hypothyroidism    Iron deficiency anemia, unspecified    Myalgia and myositis, unspecified    Personal history of colonic polyps 10/2006   hyperplastic polyp   Sleep apnea    uses CPAP   SOB (shortness of breath)     PSH:  Past Surgical History:  Procedure Laterality Date   CARDIAC CATHETERIZATION  08/25/2003   EF estimated 65% / stenting of the mid left  anterior descending and mid right coronary artery in 2004 with drug-eluting stent/Two vessel obstructive atherosclerotic coronary artery disease. / Normal left ventricular function. / Successful stenting of the mid right coronary artery and the mid left anterior descending   CARDIAC CATHETERIZATION  07/17/2010   EF estimated at 65% / Left ventricular angiography was performed in the RAO view  This demonstrates normal left ventricular size and contractility with normal  systolic function / Nonobstructive atherosclerotic coronary disease with continued  excellent patency of the stents in the mid LAD & mid right coronary artery / Normal left ventricular function   COLONOSCOPY     CORONARY STENT INTERVENTION N/A 11/23/2018   Procedure: CORONARY STENT INTERVENTION;  Surgeon: Swaziland, Peter M, MD;  Location: [facility] INVASIVE CV LAB;  Service: Cardiovascular;  Laterality: N/A;   LEFT HEART CATH AND CORONARY  ANGIOGRAPHY N/A 11/23/2018   Procedure: LEFT HEART CATH AND CORONARY ANGIOGRAPHY;  Surgeon: Swaziland, Peter M, MD;  Location: [facility] INVASIVE CV LAB;  Service: Cardiovascular;  Laterality: N/A;   MENISCUS REPAIR Left    OSTEOTOMY     removal of bone spur on (L) arm   POLYPECTOMY     ROTATOR CUFF REPAIR Right    SHOULDER ARTHROSCOPY W/ ROTATOR CUFF REPAIR Left     Social History:  Social History   Socioeconomic History   Marital status: Married    Spouse name: [name]   Number of children: 2    Years of education: Not on file   Highest education level: Master's degree (e.g., MA, MS, MEng, MEd, MSW, MBA)  Occupational History   Occupation: Warehouse manager: [employer]  Tobacco Use   Smoking status: Former    Current packs/day: 0.00    Average packs/day: 1 pack/day for 20.0 years (20.0 ttl pk-yrs)    Types: Cigarettes    Start date: 09/22/1980    Quit date: 09/22/2000    Years since quitting: 23.4   Smokeless tobacco: Never  Vaping Use   Vaping status: Never Used  Substance and Sexual Activity   Alcohol use: Yes    Alcohol/week: 3.0 standard drinks of alcohol    Types: 3 Glasses of wine per week   Drug use: No   Sexual activity: Not on file  Other Topics Concern   Not on file  Social History Narrative   Lives at home with wife   R handed   Caffeine: 3 C of coffee AM    Social Drivers of Corporate investment banker Strain: Not on file  Food Insecurity: Not on file  Transportation Needs: Not on file  Physical Activity: Not on file  Stress: Not on file  Social Connections: Unknown (02/04/2022)   Received from [facility]   Social Network    Social Network: Not on file  Intimate Partner Violence: Unknown (12/27/2021)   Received from [facility]   HITS    Physically Hurt: Not on file    Insult or Talk Down To: Not on file    Threaten Physical Harm: Not on file    Scream or Curse: Not on file    Family History:  Family History  Problem Relation Age of Onset   Pancreatic cancer Mother    Heart attack Father        x2   Prostate cancer Father    Stroke Brother    Coronary artery disease Brother    Diabetes Brother    Coronary artery disease Brother    Prostate cancer Paternal Uncle    Colon cancer Neg Hx    Esophageal cancer Neg Hx    Stomach cancer Neg Hx     Medications:   Current Outpatient Medications on File Prior to Visit  Medication Sig Dispense Refill   amLODipine  (NORVASC ) 5 MG tablet TAKE ONE TABLET BY MOUTH EVERY DAY 90 tablet  2   ARNUITY ELLIPTA 100 MCG/ACT AEPB 1 puff Inhalation Once a day as directed and rinse mouth with water after use for 30 days     aspirin  81 MG tablet Take 81 mg by mouth daily.     buPROPion  (WELLBUTRIN  XL) 300 MG 24 hr tablet Take 300 mg by mouth daily.     chlorthalidone  (HYGROTON ) 25 MG tablet TAKE 1 TABLET ONCE DAILY. 90 tablet 3   cholecalciferol (VITAMIN D3) 25 MCG (1000 UT)  tablet Take 1,000 Units by mouth daily.     Coenzyme Q10 (COQ10) 100 MG CAPS Take 100 mg by mouth daily.     cyanocobalamin  (,VITAMIN B-12,) 1000 MCG/ML injection Inject 1,000 mcg into the muscle every 30 (thirty) days.     ezetimibe  (ZETIA ) 10 MG tablet TAKE ONE TABLET ONCE DAILY 90 tablet 3   fluticasone (CUTIVATE) 0.05 % cream Apply 1 application topically daily as needed (rash).      furosemide  (LASIX ) 20 MG tablet Take 1 tablet daily as needed for swelling or weight gain. 90 tablet 3   irbesartan  (AVAPRO ) 300 MG tablet Take 1 tablet (300 mg total) by mouth daily.     ketoconazole (NIZORAL) 2 % shampoo Apply 1 application topically 3 (three) times a week.      levothyroxine  (SYNTHROID , LEVOTHROID) 200 MCG tablet Take 200 mcg by mouth See admin instructions.      nitroGLYCERIN  (NITROSTAT ) 0.4 MG SL tablet DISSOLVE 1 TABLET UNDER TONGUE EVERY 5 MINUTES AS NEEDED FOR CHEST PAIN. DO NOT EXCEED 3 DOSES. 25 tablet 1   pantoprazole (PROTONIX) 40 MG tablet 1 tablet 1/2 to 1 hour before morning meal Orally Once a day for 30 days     potassium chloride (KLOR-CON) 10 MEQ tablet 1 tablet with food Orally 3 times a week on mon, wed, friday.     REPATHA SURECLICK 140 MG/ML SOAJ Inject 1 Syringe into the skin every 14 (fourteen) days.     sildenafil (REVATIO) 20 MG tablet Take 80 mg by mouth daily as needed (ED).      tadalafil (CIALIS) 10 MG tablet 1-2 tablet as needed before sexual activity Orally Once a day for 30 days     testosterone  cypionate (DEPOTESTOSTERONE CYPIONATE) 200 MG/ML injection testosterone  cypionate 200 mg/mL  intramuscular oil     traZODone  (DESYREL ) 50 MG tablet Take 1 tablet (50 mg total) by mouth at bedtime as needed for sleep. 30 tablet 5   trimethoprim-polymyxin b (POLYTRIM) ophthalmic solution 1 drop into affected eye Ophthalmic Four times a day for 5 days     No current facility-administered medications on file prior to visit.    Allergies:   Allergies  Allergen Reactions   Atorvastatin     Other reaction(s): ck enzyme up and  muscle aches.      OBJECTIVE:  Physical Exam  General: well developed, well nourished, seated, in no evident distress  Neurologic Exam Mental Status: Awake and fully alert. Oriented to place and time. Recent and remote memory intact. Attention span, concentration and fund of knowledge appropriate. Mood and affect appropriate.       ASSESSMENT/PLAN: YECHESKEL KUREK is a 73 y.o. year old male    OSA on CPAP :  Compliance report shows satisfactory usage with optimal residual AHI.   Continue current pressure settings 6-16 with EPR 2 Discussed continued nightly usage with ensuring greater than 4 hours nightly for optimal benefit and per insurance purposes.   Continue to follow with DME company for any needed supplies or CPAP related concerns CPAP set up 08/2022, due for new machine 08/2027     Follow up in 1 year via MyChart VV or call earlier if needed   CC:  PCP: Shayne Anes, MD    I personally spent a total of 15 minutes in the care of the patient today including preparing to see the patient, performing a medically appropriate exam/evaluation, counseling and educating, placing orders, and documenting clinical information in the EHR.  Joseph  Whitfield Murphy  [facility] [address] [place], [place] [address]  Phone [phone] Fax [phone] Note: This document was prepared with digital dictation and possible smart phrase technology. Any transcriptional errors that result from this process are  unintentional.

## 2024-03-21 ENCOUNTER — Telehealth: Payer: Self-pay | Admitting: Adult Health

## 2024-03-21 NOTE — Telephone Encounter (Signed)
 Annabel with [facility] called to request  Pt last office visit be faxed to office .   Fax number # [phone]

## 2024-03-21 NOTE — Progress Notes (Signed)
 Rhianon Zabawa D, CMA  Joylene Carlean Sprung, Patricia; Ziegler, Melissa; Cain, Leveda Dollar, Dolanda New orders have been placed for the above pt, DOB: 2051-04-10 Thanks

## 2024-03-21 NOTE — Telephone Encounter (Signed)
Done, confirmation received

## 2024-08-22 ENCOUNTER — Other Ambulatory Visit: Payer: Self-pay | Admitting: Cardiology

## 2024-09-20 ENCOUNTER — Other Ambulatory Visit: Payer: Self-pay | Admitting: Cardiology

## 2024-09-29 ENCOUNTER — Encounter: Payer: Self-pay | Admitting: Cardiology

## 2025-03-16 ENCOUNTER — Telehealth: Admitting: Adult Health
